# Patient Record
Sex: Female | Born: 1970
Health system: Southern US, Community
[De-identification: ages and names within clinical notes are randomized; demographics above are authoritative.]

## PROBLEM LIST (undated history)

## (undated) DIAGNOSIS — J45909 Unspecified asthma, uncomplicated: Secondary | ICD-10-CM

## (undated) DIAGNOSIS — Z923 Personal history of irradiation: Secondary | ICD-10-CM

## (undated) DIAGNOSIS — C801 Malignant (primary) neoplasm, unspecified: Secondary | ICD-10-CM

## (undated) DIAGNOSIS — A6000 Herpesviral infection of urogenital system, unspecified: Secondary | ICD-10-CM

## (undated) HISTORY — PX: MOUTH SURGERY: SHX715

## (undated) HISTORY — DX: Personal history of irradiation: Z92.3

## (undated) HISTORY — DX: Unspecified asthma, uncomplicated: J45.909

## (undated) HISTORY — PX: FOOT SURGERY: SHX648

## (undated) HISTORY — DX: Herpesviral infection of urogenital system, unspecified: A60.00

---

## 2000-12-11 ENCOUNTER — Other Ambulatory Visit: Admission: RE | Admit: 2000-12-11 | Discharge: 2000-12-11 | Payer: Self-pay

## 2001-12-12 ENCOUNTER — Other Ambulatory Visit: Admission: RE | Admit: 2001-12-12 | Discharge: 2001-12-12 | Payer: Self-pay

## 2004-03-07 ENCOUNTER — Other Ambulatory Visit: Admission: RE | Admit: 2004-03-07 | Discharge: 2004-03-07 | Payer: Self-pay | Admitting: Unknown Physician Specialty

## 2004-03-07 ENCOUNTER — Other Ambulatory Visit: Admission: RE | Admit: 2004-03-07 | Discharge: 2004-03-07 | Payer: Self-pay | Admitting: Family Medicine

## 2005-04-05 ENCOUNTER — Other Ambulatory Visit: Admission: RE | Admit: 2005-04-05 | Discharge: 2005-04-05 | Payer: Self-pay | Admitting: Family Medicine

## 2006-04-12 ENCOUNTER — Other Ambulatory Visit: Admission: RE | Admit: 2006-04-12 | Discharge: 2006-04-12 | Payer: Self-pay | Admitting: *Deleted

## 2006-09-19 ENCOUNTER — Encounter: Admission: RE | Admit: 2006-09-19 | Discharge: 2006-09-19 | Payer: Self-pay | Admitting: Family Medicine

## 2007-05-22 ENCOUNTER — Other Ambulatory Visit: Admission: RE | Admit: 2007-05-22 | Discharge: 2007-05-22 | Payer: Self-pay | Admitting: Family Medicine

## 2008-05-24 ENCOUNTER — Other Ambulatory Visit: Admission: RE | Admit: 2008-05-24 | Discharge: 2008-05-24 | Payer: Self-pay | Admitting: Family Medicine

## 2009-09-21 ENCOUNTER — Other Ambulatory Visit: Admission: RE | Admit: 2009-09-21 | Discharge: 2009-09-21 | Payer: Self-pay | Admitting: *Deleted

## 2010-09-29 ENCOUNTER — Other Ambulatory Visit: Admission: RE | Admit: 2010-09-29 | Discharge: 2010-09-29 | Payer: Self-pay | Admitting: *Deleted

## 2011-10-08 ENCOUNTER — Other Ambulatory Visit (HOSPITAL_COMMUNITY)
Admission: RE | Admit: 2011-10-08 | Discharge: 2011-10-08 | Disposition: A | Discharge status: Home / Self Care | Payer: 59 | Source: Ambulatory Visit | Attending: Family Medicine | Admitting: Family Medicine

## 2011-10-08 ENCOUNTER — Other Ambulatory Visit: Payer: Self-pay

## 2011-10-08 DIAGNOSIS — Z01419 Encounter for gynecological examination (general) (routine) without abnormal findings: Secondary | ICD-10-CM | POA: Insufficient documentation

## 2011-10-09 ENCOUNTER — Other Ambulatory Visit: Payer: Self-pay | Admitting: Family Medicine

## 2011-10-09 DIAGNOSIS — Z1231 Encounter for screening mammogram for malignant neoplasm of breast: Secondary | ICD-10-CM

## 2011-10-22 ENCOUNTER — Ambulatory Visit: Payer: Self-pay

## 2011-10-29 ENCOUNTER — Ambulatory Visit
Admission: RE | Admit: 2011-10-29 | Discharge: 2011-10-29 | Disposition: A | Discharge status: Home / Self Care | Payer: 59 | Source: Ambulatory Visit | Attending: Family Medicine | Admitting: Family Medicine

## 2011-10-29 DIAGNOSIS — Z1231 Encounter for screening mammogram for malignant neoplasm of breast: Secondary | ICD-10-CM

## 2012-10-27 ENCOUNTER — Other Ambulatory Visit (HOSPITAL_COMMUNITY)
Admission: RE | Admit: 2012-10-27 | Discharge: 2012-10-27 | Disposition: A | Discharge status: Home / Self Care | Payer: 59 | Source: Ambulatory Visit | Attending: Family Medicine | Admitting: Family Medicine

## 2012-10-27 ENCOUNTER — Other Ambulatory Visit: Payer: Self-pay | Admitting: Family Medicine

## 2012-10-27 DIAGNOSIS — Z0000 Encounter for general adult medical examination without abnormal findings: Secondary | ICD-10-CM | POA: Insufficient documentation

## 2012-11-17 ENCOUNTER — Other Ambulatory Visit: Payer: Self-pay | Admitting: Family Medicine

## 2012-11-17 DIAGNOSIS — Z1231 Encounter for screening mammogram for malignant neoplasm of breast: Secondary | ICD-10-CM

## 2012-12-16 ENCOUNTER — Ambulatory Visit
Admission: RE | Admit: 2012-12-16 | Discharge: 2012-12-16 | Disposition: A | Discharge status: Home / Self Care | Payer: 59 | Source: Ambulatory Visit | Attending: Family Medicine | Admitting: Family Medicine

## 2012-12-16 DIAGNOSIS — Z1231 Encounter for screening mammogram for malignant neoplasm of breast: Secondary | ICD-10-CM

## 2013-01-24 ENCOUNTER — Ambulatory Visit (INDEPENDENT_AMBULATORY_CARE_PROVIDER_SITE_OTHER): Payer: 59 | Admitting: Family Medicine

## 2013-01-24 VITALS — BP 114/74 | HR 71 | Temp 98.4°F | Resp 18 | Wt 150.0 lb

## 2013-01-24 DIAGNOSIS — H60391 Other infective otitis externa, right ear: Secondary | ICD-10-CM

## 2013-01-24 DIAGNOSIS — J039 Acute tonsillitis, unspecified: Secondary | ICD-10-CM

## 2013-01-24 DIAGNOSIS — H60399 Other infective otitis externa, unspecified ear: Secondary | ICD-10-CM

## 2013-01-24 MED ORDER — AMOXICILLIN-POT CLAVULANATE 875-125 MG PO TABS: 1.0000 | ORAL_TABLET | Freq: Two times a day (BID) | ORAL | Status: DC

## 2013-01-24 MED ORDER — ANTIPYRINE-BENZOCAINE 5.4-1.4 % OT SOLN: 3.0000 [drp] | OTIC | Status: DC | PRN

## 2013-01-24 MED ORDER — CARBAMIDE PEROXIDE 6.5 % OT SOLN: 5.0000 [drp] | OTIC | Status: DC | PRN

## 2013-01-24 NOTE — Addendum Note (Signed)
Addended byCaffie Damme on: 01/24/2013 05:14 PM   Modules accepted: Orders

## 2013-01-24 NOTE — Progress Notes (Signed)
Subjective:    Patient ID: [name] [name], female    DOB: [birth year], 42 y.o.   MRN: [id number]  HPI Right side started hurting on Thursday - has had before but never lasted this long - initially was intermittent but now constant.  Has been taking pain medicine, ibuprofen, but none this morning. No f/c. Right side of throat sore and adenopathy. Able to tol po but worse with chewing. No tinnitus, change in hearing, or drainage.  History reviewed. No pertinent past medical history. No current outpatient prescriptions on file prior to visit.   No current facility-administered medications on file prior to visit.   No Known Allergies   Review of Systems  Constitutional: Positive for appetite change. Negative for fever, chills, diaphoresis and fatigue.  HENT: Positive for ear pain, sore throat, trouble swallowing and neck pain. Negative for hearing loss, nosebleeds, congestion, rhinorrhea, sneezing, drooling, mouth sores, neck stiffness, dental problem, voice change, postnasal drip, sinus pressure, tinnitus and ear discharge.   Eyes: Negative for pain and itching.  Respiratory: Negative for cough and shortness of breath.   Cardiovascular: Negative for chest pain.  Gastrointestinal: Negative for nausea, vomiting, abdominal pain, diarrhea and constipation.  Genitourinary: Negative for dysuria.  Musculoskeletal: Negative for myalgias, joint swelling, arthralgias and gait problem.  Neurological: Negative for dizziness, syncope and headaches.  Hematological: Positive for adenopathy.  Psychiatric/Behavioral: Positive for sleep disturbance.      BP 114/74  Pulse 71  Temp(Src) 98.4 F (36.9 C) (Oral)  Resp 18  Wt 150 lb (68.04 kg)  LMP 01/17/2013 Objective:   Physical Exam  Constitutional: She is oriented to person, place, and time. She appears well-developed and well-nourished. No distress.  HENT:  Head: Normocephalic and atraumatic. No trismus in the jaw.  Right Ear: There is swelling  and tenderness. Tympanic membrane is injected, erythematous and bulging.  Left Ear: Tympanic membrane, external ear and ear canal normal.  Nose: Nose normal. No mucosal edema or rhinorrhea.  Mouth/Throat: Uvula is midline and mucous membranes are normal. Mucous membranes are not pale and not dry. No edematous. Posterior oropharyngeal edema and posterior oropharyngeal erythema present. No oropharyngeal exudate or tonsillar abscesses.  Pt tearful with otoscopic exam due to pain.  Right canal with erythema, edema, and pain.  No exudate. Tonsils 1+ bilaterally  Eyes: Conjunctivae are normal. Right eye exhibits no discharge. Left eye exhibits no discharge. No scleral icterus.  Neck: Normal range of motion. Neck supple.  Cardiovascular: Normal rate, regular rhythm, normal heart sounds and intact distal pulses.   Pulmonary/Chest: Effort normal and breath sounds normal. No respiratory distress.  Lymphadenopathy:       Head (right side): Submandibular, tonsillar and preauricular adenopathy present. No posterior auricular and no occipital adenopathy present.       Head (left side): No submandibular, no tonsillar, no preauricular, no posterior auricular and no occipital adenopathy present.    She has cervical adenopathy.       Right cervical: Superficial cervical adenopathy present. No posterior cervical adenopathy present.      Left cervical: No superficial cervical and no posterior cervical adenopathy present.       Right: No supraclavicular adenopathy present.       Left: No supraclavicular adenopathy present.  Neurological: She is alert and oriented to person, place, and time.  Skin: Skin is warm and dry. She is not diaphoretic. No erythema.  Psychiatric: She has a normal mood and affect. Her behavior is normal.  Assessment & Plan:  Otitis, externa, infective, right - Plan: amoxicillin-clavulanate (AUGMENTIN) 875-125 MG per tablet, antipyrine-benzocaine (AURALGAN) otic solution   Stop  using qtips to clean out ears - try debrox or if builds up, can be lavaged in ovvice. Tonsillitis  Meds ordered this encounter  Medications  . amoxicillin-clavulanate (AUGMENTIN) 875-125 MG per tablet    Sig: Take 1 tablet by mouth 2 (two) times daily.    Dispense:  20 tablet    Refill:  0  . antipyrine-benzocaine (AURALGAN) otic solution    Sig: Place 3 drops into the right ear every 2 (two) hours as needed for pain.    Dispense:  10 mL    Refill:  0  . carbamide peroxide (DEBROX) 6.5 % otic solution    Sig: Place 5 drops into both ears as needed.    Dispense:  15 mL    Refill:  5

## 2013-01-24 NOTE — Patient Instructions (Addendum)
Carbamide Peroxide ear solution What is this medicine? CARBAMIDE PEROXIDE (CAR bah mide per OX ide) is used to soften and help remove ear wax. This medicine may be used for other purposes; ask your health care provider or pharmacist if you have questions. What should I tell my health care provider before I take this medicine? They need to know if you have any of these conditions: -dizziness -ear discharge -ear pain, irritation or rash -infection -perforated eardrum (hole in eardrum) -an unusual or allergic reaction to carbamide peroxide, glycerin, hydrogen peroxide, other medicines, foods, dyes, or preservatives -pregnant or trying to get pregnant -breast-feeding How should I use this medicine? This medicine is only for use in the outer ear canal. Follow the directions carefully. Wash hands before and after use. The solution may be warmed by holding the bottle in the hand for 1 to 2 minutes. Lie with the affected ear facing upward. Place the proper number of drops into the ear canal. After the drops are instilled, remain lying with the affected ear upward for 5 minutes to help the drops stay in the ear canal. A cotton ball may be gently inserted at the ear opening for no longer than 5 to 10 minutes to ensure retention. Repeat, if necessary, for the opposite ear. Do not touch the tip of the dropper to the ear, fingertips, or other surface. Do not rinse the dropper after use. Keep container tightly closed. Talk to your pediatrician regarding the use of this medicine in children. While this drug may be used in children as young as 12 years for selected conditions, precautions do apply. Overdosage: If you think you have taken too much of this medicine contact a poison control center or emergency room at once. NOTE: This medicine is only for you. Do not share this medicine with others. What if I miss a dose? If you miss a dose, use it as soon as you can. If it is almost time for your next dose, use  only that dose. Do not use double or extra doses. What may interact with this medicine? Interactions are not expected. Do not use any other ear products without asking your doctor or health care professional. This list may not describe all possible interactions. Give your health care provider a list of all the medicines, herbs, non-prescription drugs, or dietary supplements you use. Also tell them if you smoke, drink alcohol, or use illegal drugs. Some items may interact with your medicine. What should I watch for while using this medicine? This medicine is not for long-term use. Do not use for more than 4 days without checking with your health care professional. Contact your doctor or health care professional if your condition does not start to get better within a few days or if you notice burning, redness, itching or swelling. What side effects may I notice from receiving this medicine? Side effects that you should report to your doctor or health care professional as soon as possible: -allergic reactions like skin rash, itching or hives, swelling of the face, lips, or tongue -burning, itching, and redness -worsening ear pain -rash Side effects that usually do not require medical attention (report to your doctor or health care professional if they continue or are bothersome): -abnormal sensation while putting the drops in the ear -temporary reduction in hearing (but not complete loss of hearing) This list may not describe all possible side effects. Call your doctor for medical advice about side effects. You may report side effects to FDA  at 1-800-FDA-1088. Where should I keep my medicine? Keep out of the reach of children. Store at room temperature between 15 and 30 degrees C (59 and 86 degrees F) in a tight, light-resistant container. Keep bottle away from excessive heat and direct sunlight. Throw away any unused medicine after the expiration date. NOTE: This sheet is a summary. It may not cover  all possible information. If you have questions about this medicine, talk to your doctor, pharmacist, or health care provider.  2013, [employer]vier/Gold Standard. (02/17/2008 2:00:02 PM) Otitis Externa Otitis externa is a bacterial or fungal infection of the outer ear canal. This is the area from the eardrum to the outside of the ear. Otitis externa is sometimes called "swimmer's ear." CAUSES  Possible causes of infection include:  Swimming in dirty water.  Moisture remaining in the ear after swimming or bathing.  Mild injury (trauma) to the ear.  Objects stuck in the ear (foreign body).  Cuts or scrapes (abrasions) on the outside of the ear. SYMPTOMS  The first symptom of infection is often itching in the ear canal. Later signs and symptoms may include swelling and redness of the ear canal, ear pain, and yellowish-white fluid (pus) coming from the ear. The ear pain may be worse when pulling on the earlobe. DIAGNOSIS  Your caregiver will perform a physical exam. A sample of fluid may be taken from the ear and examined for bacteria or fungi. TREATMENT  Antibiotic ear drops are often given for 10 to 14 days. Treatment may also include pain medicine or corticosteroids to reduce itching and swelling. PREVENTION   Keep your ear dry. Use the corner of a towel to absorb water out of the ear canal after swimming or bathing.  Avoid scratching or putting objects inside your ear. This can damage the ear canal or remove the protective wax that lines the canal. This makes it easier for bacteria and fungi to grow.  Avoid swimming in lakes, polluted water, or poorly chlorinated pools.  You may use ear drops made of rubbing alcohol and vinegar after swimming. Combine equal parts of white vinegar and alcohol in a bottle. Put 3 or 4 drops into each ear after swimming. HOME CARE INSTRUCTIONS   Apply antibiotic ear drops to the ear canal as prescribed by your caregiver.  Only take over-the-counter or  prescription medicines for pain, discomfort, or fever as directed by your caregiver.  If you have diabetes, follow any additional treatment instructions from your caregiver.  Keep all follow-up appointments as directed by your caregiver. SEEK MEDICAL CARE IF:   You have a fever.  Your ear is still red, swollen, painful, or draining pus after 3 days.  Your redness, swelling, or pain gets worse.  You have a severe headache.  You have redness, swelling, pain, or tenderness in the area behind your ear. MAKE SURE YOU:   Understand these instructions.  Will watch your condition.  Will get help right away if you are not doing well or get worse. Document Released: 11/05/2005 Document Revised: 01/28/2012 Document Reviewed: 11/22/2011 [facility] Patient Information 2013 [place], [place].

## 2013-01-24 NOTE — Addendum Note (Signed)
Addended by: Maryann Alar on: 01/24/2013 09:25 AM   Modules accepted: Orders, Medications

## 2013-01-26 ENCOUNTER — Telehealth: Payer: Self-pay

## 2013-01-26 NOTE — Telephone Encounter (Signed)
Called her, left message for her to call me back, what does she need this is first message I have gotten.

## 2013-01-26 NOTE — Telephone Encounter (Signed)
PT STATES SHE HAD CALLED EARLIER AND WAS WAITING FOR A CALL BACK AND HADN'T HEARD FROM ANYONE. PLEASE CALL [phone]

## 2013-01-27 NOTE — Telephone Encounter (Signed)
Called her again what is she in need of?lm forcall back

## 2013-01-28 NOTE — Telephone Encounter (Signed)
See below patient wants to know if there is anything else she can do. Not much better.

## 2013-01-28 NOTE — Telephone Encounter (Signed)
Patient states ears not better with ear drops.

## 2013-01-28 NOTE — Telephone Encounter (Signed)
Patient has questions.  

## 2013-01-28 NOTE — Telephone Encounter (Signed)
She has been taking the Augmentin since Sat and ears not much better with the ear drops.

## 2013-01-29 NOTE — Telephone Encounter (Signed)
Left message for her to call me back. 

## 2013-01-29 NOTE — Telephone Encounter (Signed)
She may just need a different antibiotic or antibiotic ear drops but we need to make sure she hasn't developed an abscess on her tonsil or different type of ear infection - like an otitis externa and make sure she doesn't need to see ENT so rec RTC for further eval.

## 2013-01-29 NOTE — Telephone Encounter (Signed)
Patient advised of need for office visit. She can come in later today.

## 2013-07-28 ENCOUNTER — Ambulatory Visit
Admission: RE | Admit: 2013-07-28 | Discharge: 2013-07-28 | Disposition: A | Discharge status: Home / Self Care | Payer: 59 | Source: Ambulatory Visit | Attending: Family Medicine | Admitting: Family Medicine

## 2013-07-28 ENCOUNTER — Other Ambulatory Visit: Payer: Self-pay | Admitting: Family Medicine

## 2013-07-28 DIAGNOSIS — N63 Unspecified lump in unspecified breast: Secondary | ICD-10-CM

## 2013-08-27 ENCOUNTER — Other Ambulatory Visit: Payer: Self-pay | Admitting: Obstetrics & Gynecology

## 2013-09-08 ENCOUNTER — Telehealth: Payer: Self-pay | Admitting: Oncology

## 2013-09-08 NOTE — Telephone Encounter (Signed)
[facility] 11/04 @ 2 W/DR. [facility] WELCOME PACKET MAILED.

## 2013-09-09 ENCOUNTER — Telehealth: Payer: Self-pay | Admitting: Oncology

## 2013-09-09 NOTE — Telephone Encounter (Signed)
C/D /  09/09/13 for appt 09/22/13

## 2013-09-22 ENCOUNTER — Encounter: Payer: 59 | Admitting: Oncology

## 2014-03-09 ENCOUNTER — Encounter: Payer: Self-pay | Admitting: Cardiology

## 2014-03-09 ENCOUNTER — Encounter: Payer: Self-pay | Admitting: *Deleted

## 2014-03-09 ENCOUNTER — Ambulatory Visit (INDEPENDENT_AMBULATORY_CARE_PROVIDER_SITE_OTHER): Payer: 59 | Admitting: Cardiology

## 2014-03-09 DIAGNOSIS — R001 Bradycardia, unspecified: Secondary | ICD-10-CM

## 2014-03-09 DIAGNOSIS — I498 Other specified cardiac arrhythmias: Secondary | ICD-10-CM

## 2014-03-09 DIAGNOSIS — A6000 Herpesviral infection of urogenital system, unspecified: Secondary | ICD-10-CM | POA: Insufficient documentation

## 2014-03-09 DIAGNOSIS — R011 Cardiac murmur, unspecified: Secondary | ICD-10-CM

## 2014-03-09 NOTE — Progress Notes (Signed)
     HPI: 43 yo female for evaluation of bradycardia. Patient exercises routinely and denies dyspnea on exertion, orthopnea, PND, pedal edema, palpitations, syncope or exertional chest pain. She has had significant stressors in her life recently. In the past 2 months she has had occasions where as she is falling asleep she feels her heart slowed down as if it will not continue. She has not had syncope or chest pain.  Current Outpatient Prescriptions  Medication Sig Dispense Refill  . Cholecalciferol (VITAMIN D3) 2000 UNITS TABS Take by mouth daily.      Marland Kitchen co-enzyme Q-10 30 MG capsule Take 30 mg by mouth daily.      . Omega-3 Fatty Acids (OMEGA-3 FISH OIL PO) Take by mouth daily.      . Prenatal Vit-Fe Fumarate-FA (PRENATAL MULTIVITAMIN) TABS tablet Take 1 tablet by mouth daily at 12 noon.      . zinc gluconate 50 MG tablet Take 50 mg by mouth daily.       No current facility-administered medications for this visit.    No Known Allergies   Past Medical History  Diagnosis Date  . Genital herpes     History reviewed. No pertinent past surgical history.  History   Social History  . Marital Status: Married    Spouse Name: N/A    Number of Children: N/A  . Years of Education: N/A   Occupational History  . Not on file.   Social History Main Topics  . Smoking status: Never Smoker   . Smokeless tobacco: Not on file  . Alcohol Use: Yes     Comment: social  . Drug Use: No  . Sexual Activity: Yes   Other Topics Concern  . Not on file   Social History Narrative  . No narrative on file    Family History  Problem Relation Age of Onset  . CVA    . Cancer - Ovarian Sister   . Cancer      ROS: no fevers or chills, productive cough, hemoptysis, dysphasia, odynophagia, melena, hematochezia, dysuria, hematuria, rash, seizure activity, orthopnea, PND, pedal edema, claudication. Remaining systems are negative.  Physical Exam:   There were no vitals taken for this  visit.  General:  Well developed/well nourished in NAD Skin warm/dry Patient not depressed No peripheral clubbing Back-normal HEENT-normal/normal eyelids Neck supple/normal carotid upstroke bilaterally; no bruits; no JVD; no thyromegaly chest - CTA/ normal expansion CV - RRR/normal S1 and S2; no rubs or gallops;  PMI nondisplaced, 1/6 systolic ejection murmur Abdomen -NT/ND, no HSM, no mass, + bowel sounds, no bruit 2+ femoral pulses, no bruits Ext-no edema, chords, 2+ DP Neuro-grossly nonfocal  ECG Sinus rhythm at a rate of 66. RV conduction delay.

## 2014-03-09 NOTE — Patient Instructions (Signed)
Your physician recommends that you schedule a follow-up appointment in: AS NEEDED PENDING TEST RESULTS  Your physician has requested that you have an echocardiogram. Echocardiography is a painless test that uses sound waves to create images of your heart. It provides your doctor with information about the size and shape of your heart and how well your heart's chambers and valves are working. This procedure takes approximately one hour. There are no restrictions for this procedure.    Your physician has recommended that you wear a 24 HOUR holter monitor. Holter monitors are medical devices that record the heart's electrical activity. Doctors most often use these monitors to diagnose arrhythmias. Arrhythmias are problems with the speed or rhythm of the heartbeat. The monitor is a small, portable device. You can wear one while you do your normal daily activities. This is usually used to diagnose what is causing palpitations/syncope (passing out).

## 2014-03-09 NOTE — Assessment & Plan Note (Signed)
Patient is having what she feels is episodes of bradycardia when falling asleep. She is extremely concerned about this. I will arrange a Holter monitor to further assess.

## 2014-03-09 NOTE — Assessment & Plan Note (Signed)
Probable ejection murmur. Check echocardiogram.

## 2014-03-15 ENCOUNTER — Ambulatory Visit (HOSPITAL_COMMUNITY): Payer: 59 | Attending: Cardiology | Admitting: Cardiology

## 2014-03-15 ENCOUNTER — Encounter: Payer: Self-pay | Admitting: *Deleted

## 2014-03-15 ENCOUNTER — Encounter (INDEPENDENT_AMBULATORY_CARE_PROVIDER_SITE_OTHER): Payer: 59

## 2014-03-15 DIAGNOSIS — I498 Other specified cardiac arrhythmias: Secondary | ICD-10-CM | POA: Insufficient documentation

## 2014-03-15 DIAGNOSIS — I495 Sick sinus syndrome: Secondary | ICD-10-CM

## 2014-03-15 DIAGNOSIS — R001 Bradycardia, unspecified: Secondary | ICD-10-CM

## 2014-03-15 DIAGNOSIS — R011 Cardiac murmur, unspecified: Secondary | ICD-10-CM

## 2014-03-15 NOTE — Progress Notes (Signed)
Patient ID: [name] [name], female   DOB: February 09, 1971, 43 y.o.   MRN: [id number] E-Cardio 24 hour holter monitor applied to patient.

## 2014-03-15 NOTE — Progress Notes (Signed)
Echo performed. 

## 2014-03-19 ENCOUNTER — Telehealth: Payer: Self-pay | Admitting: *Deleted

## 2014-03-19 NOTE — Telephone Encounter (Signed)
Monitor reviewed by dr Stanford Breed shows sinus with pac's and brief PAT  pt aware of results

## 2014-09-08 ENCOUNTER — Other Ambulatory Visit: Payer: Self-pay | Admitting: Obstetrics & Gynecology

## 2014-09-08 ENCOUNTER — Other Ambulatory Visit (HOSPITAL_COMMUNITY)
Admission: RE | Admit: 2014-09-08 | Discharge: 2014-09-08 | Disposition: A | Discharge status: Home / Self Care | Payer: 59 | Source: Ambulatory Visit | Attending: Obstetrics & Gynecology | Admitting: Obstetrics & Gynecology

## 2014-09-08 DIAGNOSIS — Z01419 Encounter for gynecological examination (general) (routine) without abnormal findings: Secondary | ICD-10-CM | POA: Diagnosis present

## 2014-09-08 DIAGNOSIS — Z1151 Encounter for screening for human papillomavirus (HPV): Secondary | ICD-10-CM | POA: Diagnosis present

## 2014-09-09 LAB — CYTOLOGY - PAP

## 2015-01-31 ENCOUNTER — Ambulatory Visit (INDEPENDENT_AMBULATORY_CARE_PROVIDER_SITE_OTHER): Payer: 59

## 2015-01-31 ENCOUNTER — Encounter: Payer: Self-pay | Admitting: Podiatry

## 2015-01-31 ENCOUNTER — Ambulatory Visit (INDEPENDENT_AMBULATORY_CARE_PROVIDER_SITE_OTHER): Payer: 59 | Admitting: Podiatry

## 2015-01-31 VITALS — BP 126/73 | HR 66 | Resp 16

## 2015-01-31 DIAGNOSIS — M79672 Pain in left foot: Secondary | ICD-10-CM | POA: Diagnosis not present

## 2015-01-31 DIAGNOSIS — M6740 Ganglion, unspecified site: Secondary | ICD-10-CM | POA: Diagnosis not present

## 2015-01-31 NOTE — Progress Notes (Signed)
   Subjective:    Patient ID: [name] [name], female    DOB: 09-Feb-1971, 44 y.o.   MRN: [id number]  HPI Pt presents with knot on lateral aspect of right foot, nonpainful, noticed 2 months prior   Review of Systems  All other systems reviewed and are negative.      Objective:   Physical Exam        Assessment & Plan:

## 2015-02-02 NOTE — Progress Notes (Signed)
Subjective:     Patient ID: [name] [name], female   DOB: [birth year], 44 y.o.   MRN: [id number]  HPI patient presents stating that the outside of the right foot there is a lump that's been there for several months. It can become tender with certain types of shoes but does not hurt all the time and she thinks it has grown some in size. She is concerned about what it is and the fact that has grown   Review of Systems  All other systems reviewed and are negative.      Objective:   Physical Exam  Constitutional: She is oriented to person, place, and time.  Cardiovascular: Intact distal pulses.   Musculoskeletal: Normal range of motion.  Neurological: She is oriented to person, place, and time.  Skin: Skin is warm.  Nursing note and vitals reviewed.  neurovascular status intact with muscle strength adequate and range of motion subtalar midtarsal joint within normal limits. Patient's noted to have a large lump on the lateral side of the right foot measuring about 1.5 x 1.5 cm that appears to be freely movable within subcutaneous tissue and does not appear to have extension. 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 Today I went ahead and I did a proximal nerve block of the area and I then aspirated the lesion getting out about 2 mL of gelatinous material and I then expressed the rest of it and applied compression dressing. Explained the patient that the material indicate very strongly that this is a ganglionic cyst but they can recur at times and we will reevaluate her as needed

## 2015-06-22 ENCOUNTER — Encounter: Payer: Self-pay | Admitting: Podiatry

## 2015-06-22 ENCOUNTER — Ambulatory Visit (INDEPENDENT_AMBULATORY_CARE_PROVIDER_SITE_OTHER): Payer: Commercial Managed Care - HMO | Admitting: Podiatry

## 2015-06-22 VITALS — BP 135/77 | HR 67 | Resp 15

## 2015-06-22 DIAGNOSIS — M6740 Ganglion, unspecified site: Secondary | ICD-10-CM | POA: Diagnosis not present

## 2015-06-23 NOTE — Progress Notes (Signed)
Subjective:     Patient ID: [name] [name], female   DOB: June 25, 1971, 44 y.o.   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 Applied sterile dressing instructed on compression and reappoint for consideration of surgical removal if it reoccurs

## 2015-10-28 ENCOUNTER — Other Ambulatory Visit: Payer: Self-pay | Admitting: Obstetrics & Gynecology

## 2017-01-31 DIAGNOSIS — Z803 Family history of malignant neoplasm of breast: Secondary | ICD-10-CM | POA: Diagnosis not present

## 2017-01-31 DIAGNOSIS — Z1231 Encounter for screening mammogram for malignant neoplasm of breast: Secondary | ICD-10-CM | POA: Diagnosis not present

## 2017-02-19 DIAGNOSIS — Z0000 Encounter for general adult medical examination without abnormal findings: Secondary | ICD-10-CM | POA: Diagnosis not present

## 2017-02-19 DIAGNOSIS — Z13220 Encounter for screening for lipoid disorders: Secondary | ICD-10-CM | POA: Diagnosis not present

## 2017-06-12 DIAGNOSIS — R2241 Localized swelling, mass and lump, right lower limb: Secondary | ICD-10-CM | POA: Diagnosis not present

## 2017-09-18 DIAGNOSIS — Z01419 Encounter for gynecological examination (general) (routine) without abnormal findings: Secondary | ICD-10-CM | POA: Diagnosis not present

## 2018-02-03 DIAGNOSIS — Z1231 Encounter for screening mammogram for malignant neoplasm of breast: Secondary | ICD-10-CM | POA: Diagnosis not present

## 2018-02-03 DIAGNOSIS — Z803 Family history of malignant neoplasm of breast: Secondary | ICD-10-CM | POA: Diagnosis not present

## 2018-04-29 DIAGNOSIS — E559 Vitamin D deficiency, unspecified: Secondary | ICD-10-CM | POA: Diagnosis not present

## 2018-04-29 DIAGNOSIS — Z0000 Encounter for general adult medical examination without abnormal findings: Secondary | ICD-10-CM | POA: Diagnosis not present

## 2018-08-08 DIAGNOSIS — R300 Dysuria: Secondary | ICD-10-CM | POA: Diagnosis not present

## 2018-09-22 DIAGNOSIS — R300 Dysuria: Secondary | ICD-10-CM | POA: Diagnosis not present

## 2018-11-25 DIAGNOSIS — Z01419 Encounter for gynecological examination (general) (routine) without abnormal findings: Secondary | ICD-10-CM | POA: Diagnosis not present

## 2020-04-20 ENCOUNTER — Other Ambulatory Visit: Payer: Self-pay | Admitting: Family Medicine

## 2020-04-20 DIAGNOSIS — E049 Nontoxic goiter, unspecified: Secondary | ICD-10-CM

## 2020-04-28 ENCOUNTER — Ambulatory Visit
Admission: RE | Admit: 2020-04-28 | Discharge: 2020-04-28 | Disposition: A | Discharge status: Home / Self Care | Payer: 59 | Source: Ambulatory Visit | Attending: Family Medicine | Admitting: Family Medicine

## 2020-04-28 DIAGNOSIS — E049 Nontoxic goiter, unspecified: Secondary | ICD-10-CM

## 2020-06-27 ENCOUNTER — Other Ambulatory Visit: Payer: Self-pay | Admitting: Radiology

## 2020-06-29 ENCOUNTER — Telehealth: Payer: Self-pay | Admitting: Oncology

## 2020-06-29 ENCOUNTER — Other Ambulatory Visit: Payer: Self-pay | Admitting: Obstetrics & Gynecology

## 2020-06-29 DIAGNOSIS — R928 Other abnormal and inconclusive findings on diagnostic imaging of breast: Secondary | ICD-10-CM

## 2020-06-29 NOTE — Telephone Encounter (Signed)
Spoke to patient to confirm PM clinic, [name] [name] will send packet to patient

## 2020-07-01 ENCOUNTER — Encounter: Payer: Self-pay | Admitting: Genetic Counselor

## 2020-07-01 ENCOUNTER — Encounter: Payer: Self-pay | Admitting: *Deleted

## 2020-07-01 DIAGNOSIS — C50811 Malignant neoplasm of overlapping sites of right female breast: Secondary | ICD-10-CM

## 2020-07-05 NOTE — Progress Notes (Signed)
[place]  [phone] [phone]     ID: [name] [name] DOB: 1971/03/24  MR#: [id number]  UVO#:[id number]  Patient Care Team: Aretta Nip, MD as PCP - General (Family Medicine) Mauro Kaufmann, RN as Oncology Nurse Navigator Rockwell Germany, RN as Oncology Nurse Navigator Jovita Kussmaul, MD as Consulting Physician (General Surgery) Corbyn Steedman, Virgie Dad, MD as Consulting Physician (Oncology) Gery Pray, MD as Consulting Physician (Radiation Oncology) Janyth Pupa, DO as Consulting Physician (Obstetrics and Gynecology) Chauncey Cruel, MD OTHER MD:  CHIEF COMPLAINT: Estrogen receptor positive breast cancer  CURRENT TREATMENT: Awaiting definitive surgery   HISTORY OF CURRENT ILLNESS: [name] [name] herself noted a change in her right breast when exercising.  She thinks she first noticed this sometime in April 2021.  It was like an indentation when she AB duct at the right upper extremity.  Screening mammogram from 04/15/2020 was negative, with breast density category C.  However the problem became more of a palpable nontender lump and after evaluation by her primary care physician she underwent right breast ultrasonography at [facility] on 06/23/2020 showing: 1.6 cm mass in right breast at 8 o'clock; 1.2 cm mass in right breast at 9 o'clock; the masses are 1.5 cm apart; single indeterminate mildly abnormal node in right axilla.  Accordingly on 06/27/2020 she proceeded to biopsy of the right breast areas in question. The pathology from this procedure ([id number]) showed: invasive and in situ mammary carcinoma, e-cadherin positive, grade 1-2. Both masses showed similar morphology. Prognostic indicators significant for: estrogen receptor, 100% positive with strong staining intensity and progesterone receptor, 90% positive with moderate-weak staining intensity. Proliferation marker Ki67 at 2%. HER2 equivocal by immunohistochemistry (2+), but negative by  fluorescent in situ hybridization with a signals ratio 1.47 and number per cell 2.5.  The patient's subsequent history is as detailed below.   INTERVAL HISTORY: [name] [name] was evaluated in the multidisciplinary breast cancer clinic on 07/06/2020 accompanied by her husband [name]. Her case was also presented at the multidisciplinary breast cancer conference on the same day. At that time a preliminary plan was proposed: MRI to assess the extent of disease, then hopefully breast conserving surgery with sentinel lymph node sampling, Oncotype, adjuvant radiation, antiestrogens, and genetics.   REVIEW OF SYSTEMS: On the provided questionnaire, [name] [name] reports chills, weight change, wearing glasses, hoarse voice, sleeping with two pillows, change in stool habits, some abdominal pain, dribbling urine, palpable breast lump, back pain, numbness, and depression ("crying").  What concerns her most is a sensation of numbness in the right arm--not just the right hand--which can occur at times, perhaps at rest, sometimes with activity.  The patient denies unusual headaches, visual changes, nausea, vomiting, stiff neck, dizziness, or gait imbalance. There has been no cough, phlegm production, or pleurisy, no chest pain or pressure, and no change in bladder habits. The patient denies fever, rash, or bleeding. A detailed review of systems was otherwise entirely negative.   PAST MEDICAL HISTORY: Past Medical History:  Diagnosis Date   Asthma    Genital herpes     PAST SURGICAL HISTORY: Past Surgical History:  Procedure Laterality Date   FOOT SURGERY Right    MOUTH SURGERY Right     FAMILY HISTORY: Family History  Problem Relation Age of Onset   CVA Other    Cancer - Ovarian Sister    Cancer Other    Her father is currently 74, and her mother is 26 as of 06/2020. [name] [name] has 3 brothers and  4 sisters. She reports ovarian cancer in her sister at age 3 (sister is doing well now) and breast cancer in a  maternal great-aunt (post-menopausal).   GYNECOLOGIC HISTORY:  Patient's last menstrual period was 05/23/2020 (exact date). Menarche: 49 years old Age at first live birth: 49 years old Ramona P 1 LMP 7/5 - 05/28/2020, periods are irregular Contraceptive: never used HRT n/a  Hysterectomy? no BSO? 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 Husband Iona Beard "[name]" is a Airline pilot. She lives at home with [name]. Daughter Sherlie Ban, age 84, is a Education administrator here in [place]. [name] [name] attends [employer].    ADVANCED DIRECTIVES: In the absence of any documentation to the contrary, the patient's spouse is their HCPOA.    HEALTH MAINTENANCE: Social History   Tobacco Use   Smoking status: Never Smoker  Substance Use Topics   Alcohol use: Yes    Comment: social   Drug use: No     Colonoscopy: n/a (age)  PAP: 01/2020  Bone density: n/a (age)   No Known Allergies  Current Outpatient Medications  Medication Sig Dispense Refill   BIOTIN PO Take by mouth. Once weekly     Cholecalciferol (VITAMIN D3) 2000 UNITS TABS Take by mouth daily.     co-enzyme Q-10 30 MG capsule Take 30 mg by mouth daily.     Omega-3 Fatty Acids (OMEGA-3 FISH OIL PO) Take by mouth daily.     Prenatal Vit-Fe Fumarate-FA (PRENATAL MULTIVITAMIN) TABS tablet Take 1 tablet by mouth daily at 12 noon.     zinc gluconate 50 MG tablet Take 50 mg by mouth daily. (Patient not taking: Reported on 07/06/2020)     No current facility-administered medications for this visit.    OBJECTIVE: African-American woman who appears younger than stated age  49:   07/06/20 1307  BP: 127/86  Pulse: 75  Resp: 18  Temp: 98.4 F (36.9 C)  SpO2: 100%     Body mass index is 27.05 kg/m.   Wt Readings from Last 3 Encounters:  07/06/20 147 lb 14.4 oz (67.1 kg)  01/24/13 150 lb (68 kg)      ECOG FS:1 - Symptomatic but completely ambulatory  Ocular:  Sclerae unicteric, pupils round and equal Ear-nose-throat: Wearing a mask Lymphatic: No cervical or supraclavicular adenopathy Lungs no rales or rhonchi Heart regular rate and rhythm Abd soft, nontender, positive bowel sounds MSK no focal spinal tenderness, no joint edema Neuro: non-focal, well-oriented, appropriate affect Breasts: The right breast is status post recent biopsy.  There is a significant ecchymosis.  The left breast is benign.  Both axillae are benign.   LAB RESULTS:  CMP     Component Value Date/Time   NA 139 07/06/2020 1238   K 3.7 07/06/2020 1238   CL 107 07/06/2020 1238   CO2 26 07/06/2020 1238   GLUCOSE 91 07/06/2020 1238   BUN 9 07/06/2020 1238   CREATININE 1.09 (H) 07/06/2020 1238   CALCIUM 9.7 07/06/2020 1238   PROT 7.1 07/06/2020 1238   ALBUMIN 3.6 07/06/2020 1238   AST 22 07/06/2020 1238   ALT 17 07/06/2020 1238   ALKPHOS 43 07/06/2020 1238   BILITOT 0.5 07/06/2020 1238   GFRNONAA 60 (L) 07/06/2020 1238   GFRAA >60 07/06/2020 1238    No results found for: TOTALPROTELP, ALBUMINELP, A1GS, A2GS, BETS, BETA2SER, GAMS, MSPIKE, SPEI  Lab Results  Component Value Date   WBC 6.4 07/06/2020  NEUTROABS 3.8 07/06/2020   HGB 13.3 07/06/2020   HCT 39.2 07/06/2020   MCV 92.2 07/06/2020   PLT 363 07/06/2020    No results found for: LABCA2  No components found for: VELFYB017  No results for input(s): INR in the last 168 hours.  No results found for: LABCA2  No results found for: PZW258  No results found for: NID782  No results found for: UMP536  No results found for: CA2729  No components found for: HGQUANT  No results found for: CEA1 / No results found for: CEA1   No results found for: AFPTUMOR  No results found for: CHROMOGRNA  No results found for: KPAFRELGTCHN, LAMBDASER, KAPLAMBRATIO (kappa/lambda light chains)  No results found for: HGBA, HGBA2QUANT, HGBFQUANT, HGBSQUAN (Hemoglobinopathy evaluation)   No results found for:  LDH  No results found for: IRON, TIBC, IRONPCTSAT (Iron and TIBC)  No results found for: FERRITIN  Urinalysis No results found for: COLORURINE, APPEARANCEUR, LABSPEC, PHURINE, GLUCOSEU, HGBUR, BILIRUBINUR, KETONESUR, PROTEINUR, UROBILINOGEN, NITRITE, LEUKOCYTESUR   STUDIES: No results found.   ELIGIBLE FOR AVAILABLE RESEARCH PROTOCOL: AET  ASSESSMENT: 49 y.o. [place] woman status post right breast biopsy x2 on 06/27/2020 for an mT1c N0, stage IA invasive ductal carcinoma, grade 1 or 2, estrogen and progesterone receptor positive, HER-2 not amplified, with an MIB-1 of 2%.  (a) and equivocal right axillary lymph node was biopsied 06/27/2020 and was benign  (1) genetics testing  (2) definitive surgery to follow  (3) Oncotype to be obtained from the definitive surgical sample: Chemotherapy not anticipated  (4) adjuvant radiation  (5) antiestrogens: Consider goserelin/anastrozole  PLAN: I met today with [name] [name] to review her new diagnosis. Specifically we discussed the biology of her breast cancer, its diagnosis, staging, treatment  options and prognosis. We first reviewed the fact that cancer is not one disease but more than 100 different diseases and that it is important to keep them separate-- otherwise when friends and relatives discuss their own cancer experiences with [name] [name] confusion can result. Similarly we explained that if breast cancer spreads to the bone or liver, the patient would not have bone cancer or liver cancer, but breast cancer in the bone and breast cancer in the liver: one cancer in three places-- not 3 different cancers which otherwise would have to be treated in 3 different ways.  We discussed the difference between local and systemic therapy. In terms of loco-regional treatment, lumpectomy plus radiation is equivalent to mastectomy as far as survival is concerned. For this reason, and because the cosmetic results are generally superior, we recommend breast  conserving surgery.   We then discussed the rationale for systemic therapy. There is some risk that this cancer may have already spread to other parts of her body. Patients frequently ask at this point about bone scans, CAT scans and PET scans to find out if they have occult breast cancer somewhere else. The problem is that in early stage disease we are much more likely to find false positives then true cancers and this would expose the patient to unnecessary procedures as well as unnecessary radiation. Scans cannot answer the question the patient really would like to know, which is whether she has microscopic disease elsewhere in her body. For those reasons we generally do not recommend them.  In [name] case however we do have a symptom that I cannot easily explain namely the intermittent numbness in the right arm.  She has no neck symptoms.  I am going to obtain a CT  of the chest just to clarify this, however she understands I am not anticipating stage IV disease.  Next we went over the options for systemic therapy which are anti-estrogens, anti-HER-2 immunotherapy, and chemotherapy. [name] [name] does not meet criteria for anti-HER-2 immunotherapy. She is a good candidate for anti-estrogens.  The question of chemotherapy is more complicated. Chemotherapy is most effective in rapidly growing, aggressive tumors. It is much less effective in slow growing cancers, like [name] [name] 's. For that reason we are going to request an Oncotype from the definitive surgical sample, as suggested by NCCN guidelines. That will help [place] make a definitive decision regarding chemotherapy in this case.  [name] [name] also meets criteria for genetics testing.In patients who carry a deleterious mutation [for example in a  BRCA gene], the risk of a new breast cancer developing in the future may be sufficiently great that the patient may choose bilateral mastectomies. However if she wishes to keep her breasts in that situation it is safe  to do so. That would require intensified screening, which generally means not only yearly mammography but a yearly breast MRI as well.   Ensure the plan is for genetics testing, definitive surgery, Oncotype testing, radiation and antiestrogens.[name] [name] has a good understanding of the overall plan. She agrees with it. She knows the goal of treatment in her case is cure. She will call with any problems that may develop before her next visit here.  Total encounter time 70 minutes.Sarajane Jews C. Kourtnee Lahey, MD 07/06/2020 6:18 PM Medical Oncology and Hematology [facility] [place], [place] [address] Tel. [phone]    Fax. [phone]   This document serves as a record of services personally performed by Lurline Del, MD. It was created on his behalf by Wilburn Mylar, a trained medical scribe. The creation of this record is based on the scribe's personal observations and the provider's statements to them.   I, Lurline Del MD, have reviewed the above documentation for accuracy and completeness, and I agree with the above.    *Total Encounter Time as defined by the Centers for Medicare and Medicaid Services includes, in addition to the face-to-face time of a patient visit (documented in the note above) non-face-to-face time: obtaining and reviewing outside history, ordering and reviewing medications, tests or procedures, care coordination (communications with other health care professionals or caregivers) and documentation in the medical record.

## 2020-07-06 ENCOUNTER — Other Ambulatory Visit: Payer: Self-pay | Admitting: *Deleted

## 2020-07-06 ENCOUNTER — Inpatient Hospital Stay: Payer: 59 | Attending: Oncology | Admitting: Oncology

## 2020-07-06 ENCOUNTER — Ambulatory Visit: Payer: Self-pay | Admitting: General Surgery

## 2020-07-06 ENCOUNTER — Encounter: Payer: Self-pay | Admitting: *Deleted

## 2020-07-06 ENCOUNTER — Encounter: Payer: 59 | Admitting: Genetic Counselor

## 2020-07-06 ENCOUNTER — Ambulatory Visit
Admission: RE | Admit: 2020-07-06 | Discharge: 2020-07-06 | Disposition: A | Discharge status: Home / Self Care | Payer: 59 | Source: Ambulatory Visit | Attending: Radiation Oncology | Admitting: Radiation Oncology

## 2020-07-06 ENCOUNTER — Encounter: Payer: Self-pay | Admitting: General Practice

## 2020-07-06 ENCOUNTER — Inpatient Hospital Stay: Payer: 59

## 2020-07-06 ENCOUNTER — Encounter: Payer: Self-pay | Admitting: Physical Therapy

## 2020-07-06 ENCOUNTER — Other Ambulatory Visit: Payer: Self-pay

## 2020-07-06 ENCOUNTER — Ambulatory Visit: Payer: 59 | Attending: General Surgery | Admitting: Physical Therapy

## 2020-07-06 VITALS — BP 127/86 | HR 75 | Temp 98.4°F | Resp 18 | Ht 62.0 in | Wt 147.9 lb

## 2020-07-06 DIAGNOSIS — M79601 Pain in right arm: Secondary | ICD-10-CM | POA: Diagnosis present

## 2020-07-06 DIAGNOSIS — Z170 Estrogen receptor positive status [ER+]: Secondary | ICD-10-CM | POA: Insufficient documentation

## 2020-07-06 DIAGNOSIS — C50811 Malignant neoplasm of overlapping sites of right female breast: Secondary | ICD-10-CM

## 2020-07-06 DIAGNOSIS — C50511 Malignant neoplasm of lower-outer quadrant of right female breast: Secondary | ICD-10-CM | POA: Diagnosis not present

## 2020-07-06 LAB — CBC WITH DIFFERENTIAL (CANCER CENTER ONLY)
Abs Immature Granulocytes: 0.01 10*3/uL (ref 0.00–0.07)
Basophils Absolute: 0 10*3/uL (ref 0.0–0.1)
Basophils Relative: 1 %
Eosinophils Absolute: 0 10*3/uL (ref 0.0–0.5)
Eosinophils Relative: 0 %
HCT: 39.2 % (ref 36.0–46.0)
Hemoglobin: 13.3 g/dL (ref 12.0–15.0)
Immature Granulocytes: 0 %
Lymphocytes Relative: 33 %
Lymphs Abs: 2.1 10*3/uL (ref 0.7–4.0)
MCH: 31.3 pg (ref 26.0–34.0)
MCHC: 33.9 g/dL (ref 30.0–36.0)
MCV: 92.2 fL (ref 80.0–100.0)
Monocytes Absolute: 0.4 10*3/uL (ref 0.1–1.0)
Monocytes Relative: 7 %
Neutro Abs: 3.8 10*3/uL (ref 1.7–7.7)
Neutrophils Relative %: 59 %
Platelet Count: 363 10*3/uL (ref 150–400)
RBC: 4.25 MIL/uL (ref 3.87–5.11)
RDW: 11.9 % (ref 11.5–15.5)
WBC Count: 6.4 10*3/uL (ref 4.0–10.5)
nRBC: 0 % (ref 0.0–0.2)

## 2020-07-06 LAB — CMP (CANCER CENTER ONLY)
ALT: 17 U/L (ref 0–44)
AST: 22 U/L (ref 15–41)
Albumin: 3.6 g/dL (ref 3.5–5.0)
Alkaline Phosphatase: 43 U/L (ref 38–126)
Anion gap: 6 (ref 5–15)
BUN: 9 mg/dL (ref 6–20)
CO2: 26 mmol/L (ref 22–32)
Calcium: 9.7 mg/dL (ref 8.9–10.3)
Chloride: 107 mmol/L (ref 98–111)
Creatinine: 1.09 mg/dL — ABNORMAL HIGH (ref 0.44–1.00)
GFR, Est AFR Am: >60 mL/min (ref >60)
GFR, Estimated: 60 mL/min — ABNORMAL LOW (ref >60)
Glucose, Bld: 91 mg/dL (ref 70–99)
Potassium: 3.7 mmol/L (ref 3.5–5.1)
Sodium: 139 mmol/L (ref 135–145)
Total Bilirubin: 0.5 mg/dL (ref 0.3–1.2)
Total Protein: 7.1 g/dL (ref 6.5–8.1)

## 2020-07-06 LAB — GENETIC SCREENING ORDER

## 2020-07-06 NOTE — Patient Instructions (Signed)

## 2020-07-06 NOTE — Progress Notes (Signed)
[place] Psychosocial Distress Screening Spiritual Care  Met with [name] [name] and her husband in [facility] to introduce [place] team/resources, reviewing SDOH screening per protocol. Also assessed for distress and other psychosocial needs.   [name] [name] is just beginning to process her feelings about her diagnosis and welcomed Spiritual Care as part of her Support Team. She plans to review the [place] team/programming information in detail in the coming days. Provided empathic listening, normalization of feelings, pastoral reflection, and encouragement to practice self-care.  Follow up needed: Yes.  Plan to follow up by phone next week, as well as to place referrals per [name] request for an Pharmacist, hospital and for a call from [name], our incoming [name].   [place], [place], [facility] Pager [phone] Voicemail [phone]

## 2020-07-06 NOTE — Progress Notes (Signed)
Radiation Oncology         [phone] ________________________________  Multidisciplinary Breast Oncology Clinic [facility]) Initial Outpatient Consultation  Name: [name] [name] MRN: [id number]  Date: 07/06/2020  DOB: 08/16/1971  LK:RHNVKEW, Fanny Dance, MD  Griselda Miner, MD   REFERRING PHYSICIAN: Chevis Pretty III, MD  DIAGNOSIS: The encounter diagnosis was Malignant neoplasm of overlapping sites of right breast in female, estrogen receptor positive (HCC).  Stage T1c, N0, Mx Right Breast LOQ, Invasive Ductal Carcinoma with DCIS, ER+ / PR+ / Her2-, Grade 1-2    ICD-10-CM   1. Malignant neoplasm of overlapping sites of right breast in female, estrogen receptor positive (HCC)  C50.811    Z17.0     HISTORY OF PRESENT ILLNESS::[name] [name] is a 49 y.o. female who is presenting to the office today for evaluation of her newly diagnosed breast cancer. She is accompanied by her husband. She is doing well overall.   She had routine screening mammography on 04/15/2020 that did not show any mammographic evidence of malignancy. However, she then noticed a right breast indentation and palpable lump in July of 2021. Thus, she underwent a right breast ultrasound on 06/23/2020 at [facility] that showed a 1.6 x 1.0 x 1.6 cm irregular mass in the right breast at the 8 o'clock position, middle depth, that was highly suggestive of malignancy. There was an additional mass at the 9 o'clock position, anterior middle depth, that measured 1.1 x 1.2 x 0.8 cm and was also highly suggestive of malignancy. Finally, there was a single mildly abnormal node in the right axilla that was indeterminate.  Biopsy on 06/27/2020 showed grade 1-2 invasive mammary carcinoma with mammary carcinoma in-situ of both right breast masses. E-cadherin was positive, supporting a ductal origin. Prognostic indicators were significant for estrogen receptor, 100% positive with a strong staining intensity and progesterone receptor, 90% positive  with a moderate to weak staining intensity. Proliferation marker Ki67 at 2%. HER2 negative. The right axillary lymph node was negative for carcinoma.   Menarche: 49 years old Age at first live birth: 49 years old GP: 1 LMP: July 5th-10th, 2021 Contraceptive: None HRT: None   The patient was referred today for presentation in the multidisciplinary conference.  Radiology studies and pathology slides were presented there for review and discussion of treatment options.  A consensus was discussed regarding potential next steps.  PREVIOUS RADIATION THERAPY: No  PAST MEDICAL HISTORY:  Past Medical History:  Diagnosis Date  . Asthma   . Genital herpes     PAST SURGICAL HISTORY: Past Surgical History:  Procedure Laterality Date  . FOOT SURGERY Right   . MOUTH SURGERY Right     FAMILY HISTORY:  Family History  Problem Relation Age of Onset  . CVA Other   . Cancer - Ovarian Sister   . Cancer Other     SOCIAL HISTORY:  Social History   Socioeconomic History  . Marital status: Married    Spouse name: Not on file  . Number of children: Not on file  . Years of education: Not on file  . Highest education level: Not on file  Occupational History  . Not on file  Tobacco Use  . Smoking status: Never Smoker  Substance and Sexual Activity  . Alcohol use: Yes    Comment: social  . Drug use: No  . Sexual activity: Yes  Other Topics Concern  . Not on file  Social History Narrative  . Not on file   Social Determinants of  Health   Financial Resource Strain: Low Risk   . Difficulty of Paying Living Expenses: Not hard at all  Food Insecurity: No Food Insecurity  . Worried About Charity fundraiser in the Last Year: Never true  . Ran Out of Food in the Last Year: Never true  Transportation Needs: No Transportation Needs  . Lack of Transportation (Medical): No  . Lack of Transportation (Non-Medical): No  Physical Activity:   . Days of Exercise per Week:   . Minutes of Exercise  per Session:   Stress:   . Feeling of Stress :   Social Connections:   . Frequency of Communication with Friends and Family:   . Frequency of Social Gatherings with Friends and Family:   . Attends Religious Services:   . Active Member of Clubs or Organizations:   . Attends Archivist Meetings:   Marland Kitchen Marital Status:     ALLERGIES: No Known Allergies  MEDICATIONS:  Current Outpatient Medications  Medication Sig Dispense Refill  . BIOTIN PO Take by mouth. Once weekly    . Cholecalciferol (VITAMIN D3) 2000 UNITS TABS Take by mouth daily.    Marland Kitchen co-enzyme Q-10 30 MG capsule Take 30 mg by mouth daily.    . Omega-3 Fatty Acids (OMEGA-3 FISH OIL PO) Take by mouth daily.    . Prenatal Vit-Fe Fumarate-FA (PRENATAL MULTIVITAMIN) TABS tablet Take 1 tablet by mouth daily at 12 noon.    . zinc gluconate 50 MG tablet Take 50 mg by mouth daily. (Patient not taking: Reported on 07/06/2020)     No current facility-administered medications for this encounter.    REVIEW OF SYSTEMS: A 10+ POINT REVIEW OF SYSTEMS WAS OBTAINED including neurology, dermatology, psychiatry, cardiac, respiratory, lymph, extremities, GI, GU, musculoskeletal, constitutional, reproductive, HEENT. On the provided form, she reports chills, weight change, wearing glasses, hoarse voice, sleeping with two pillows, change in stool habits, occasional abdominal pain, dribbling urine, lump in right breast, back pain, numbness, and depression. She denies chest pain, shortness of breath, cough, skin changes, and any other symptoms.    PHYSICAL EXAM:   Vitals with BMI 07/06/2020  Height _0   Weight 147 lbs 14 oz  BMI 24.09  Systolic 735  Diastolic 86  Pulse 75   Lungs are clear to auscultation bilaterally. Heart has regular rate and rhythm. No palpable cervical, supraclavicular, or axillary adenopathy. Abdomen soft, non-tender, normal bowel sounds. Left breast with no palpable mass, nipple discharge, or bleeding.  Right breast  with bruising in the axillary region from lymph node biopsy. When raising her right arm, there is an indention in the lower outer quadrant of the breast. Additionally, there is a palpable lump in the lower outer quadrant that is estimated to be approximately 1.5 - 2.0 cm.  KPS = 90  100 - Normal; no complaints; no evidence of disease. 90   - Able to carry on normal activity; minor signs or symptoms of disease. 80   - Normal activity with effort; some signs or symptoms of disease. 42   - Cares for self; unable to carry on normal activity or to do active work. 60   - Requires occasional assistance, but is able to care for most of his personal needs. 50   - Requires considerable assistance and frequent medical care. 30   - Disabled; requires special care and assistance. 48   - Severely disabled; hospital admission is indicated although death not imminent. 62   - Very sick; hospital  admission necessary; active supportive treatment necessary. 10   - Moribund; fatal processes progressing rapidly. 0     - Dead  Karnofsky DA, Abelmann [place], Craver LS and Burchenal [facility] [phone]) The use of the nitrogen mustards in the palliative treatment of carcinoma: with particular reference to bronchogenic carcinoma Cancer 1 634-56  LABORATORY DATA:  Lab Results  Component Value Date   WBC 6.4 07/06/2020   HGB 13.3 07/06/2020   HCT 39.2 07/06/2020   MCV 92.2 07/06/2020   PLT 363 07/06/2020   Lab Results  Component Value Date   NA 139 07/06/2020   K 3.7 07/06/2020   CL 107 07/06/2020   CO2 26 07/06/2020   Lab Results  Component Value Date   ALT 17 07/06/2020   AST 22 07/06/2020   ALKPHOS 43 07/06/2020   BILITOT 0.5 07/06/2020    PULMONARY FUNCTION TEST:   Recent Review Flowsheet Data   There is no flowsheet data to display.     RADIOGRAPHY: No results found.    IMPRESSION: Stage T1c, N0, Mx Right Breast LOQ, Invasive Ductal Carcinoma with DCIS, ER+ / PR+ / Her2-, Grade 1-2  The patient will be  a good candidate for breast conservation with radiotherapy to the right breast. We discussed the general course of radiation, potential side effects, and toxicities with radiation and the patient is interested in this approach.  The patient has a strong family history of ovarian caner and so she may wait until the genetic testing has resulted before proceeding with breast conserving surgery.   PLAN:   1. Genetic testing 2. MRI 3. Chest CT scan 4. Right lumpectomy with sentinel lymph node biopsy 5. Oncotype Dx 6. Adjuvant radiation therapy 7. Anti-estrogen therapy   ------------------------------------------------  Blair Promise, PhD, MD  This document serves as a record of services personally performed by Gery Pray, MD. It was created on his behalf by Clerance Lav, a trained medical scribe. The creation of this record is based on the scribe's personal observations and the provider's statements to them. This document has been checked and approved by the attending provider.

## 2020-07-06 NOTE — Therapy (Signed)
[place], [place], [address] Phone: [phone]   Fax:  [phone]  Physical Therapy Evaluation  Patient Details  Name: [name] [name] MRN: [id number] Date of Birth: 1971/01/18 Referring Provider (PT): Dr. Autumn Messing   Encounter Date: 07/06/2020   PT End of Session - 07/06/20 2206    Visit Number 1    Number of Visits 2    Date for PT Re-Evaluation 08/31/20    PT Start Time 8563    PT Stop Time 1497   Also saw pt from 1451-1529 for a total of 40 min   PT Time Calculation (min) 8 min    Activity Tolerance Patient tolerated treatment well    Behavior During Therapy [facility] for tasks assessed/performed           Past Medical History:  Diagnosis Date  . Asthma   . Genital herpes     Past Surgical History:  Procedure Laterality Date  . FOOT SURGERY Right   . MOUTH SURGERY Right     There were no vitals filed for this visit.    Subjective Assessment - 07/06/20 2128    Subjective Patient reports she is here today to be seen by her medical team for her newly diagnosed right breast cancer.    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 There are 2 masses in the right lower outer quadrant measuring 1.6 cm and 1.2 cm. It is ER/PR positive and HER2 negative with a Ki67 of 2%.    Patient Stated Goals Reduce lymphedema risk and learn post op shoulder ROM HEP    Currently in Pain? Yes    Pain Score --   Varies; intermittent; mild   Pain Location Arm    Pain Orientation Right    Pain Descriptors / Indicators Tingling;Numbness    Pain Type Neuropathic pain    Pain Onset 1 to 4 weeks ago   2 weeks ago   Pain Frequency Intermittent    Aggravating Factors  Unknown    Pain Relieving Factors Unknown              [facility] PT Assessment - 07/06/20 0001      Assessment   Medical Diagnosis  Right breast cancer    Referring Provider (PT) Dr. Autumn Messing    Onset Date/Surgical Date 04/15/20    Hand Dominance Right    Prior Therapy none      Precautions   Precautions Other (comment)    Precaution Comments active cancer      Restrictions   Weight Bearing Restrictions No      Balance Screen   Has the patient fallen in the past 6 months No    Has the patient had a decrease in activity level because of a fear of falling?  No    Is the patient reluctant to leave their home because of a fear of falling?  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, structural, weights, and runs  Cognition   Overall Cognitive Status Within Functional Limits for tasks assessed      Posture/Postural Control   Posture/Postural Control No significant limitations      ROM / Strength   AROM / PROM / Strength AROM;Strength      AROM   Overall AROM Comments Cervical AROM is WNL    AROM Assessment Site Shoulder    Right/Left Shoulder Right;Left    Right Shoulder Extension 60 Degrees    Right Shoulder Flexion 144 Degrees    Right Shoulder ABduction 169 Degrees    Right Shoulder Internal Rotation 62 Degrees    Right Shoulder External Rotation 86 Degrees    Left Shoulder Extension 50 Degrees    Left Shoulder Flexion 152 Degrees    Left Shoulder ABduction 165 Degrees    Left Shoulder Internal Rotation 67 Degrees    Left Shoulder External Rotation 83 Degrees      Strength   Overall Strength Within functional limits for tasks performed             LYMPHEDEMA/ONCOLOGY QUESTIONNAIRE - 07/06/20 0001      Type   Cancer Type Right breast cancer      Lymphedema Assessments   Lymphedema  Assessments Upper extremities      Right Upper Extremity Lymphedema   10 cm Proximal to Olecranon Process 28 cm    Olecranon Process 23.5 cm    10 cm Proximal to Ulnar Styloid Process 22 cm    Just Proximal to Ulnar Styloid Process 14.4 cm    Across Hand at [employer] 19 cm    At [place] of 2nd Digit 5.5 cm      Left Upper Extremity Lymphedema   10 cm Proximal to Olecranon Process 26.8 cm    Olecranon Process 23.3 cm    10 cm Proximal to Ulnar Styloid Process 20.2 cm    Just Proximal to Ulnar Styloid Process 14.1 cm    Across Hand at [employer] 18.7 cm    At [place] of 2nd Digit 5.3 cm           L-DEX FLOWSHEETS - 07/06/20 2100      L-DEX LYMPHEDEMA SCREENING   Measurement Type Unilateral    L-DEX MEASUREMENT EXTREMITY Upper Extremity    POSITION  Standing    DOMINANT SIDE Right    At Risk Side Right    BASELINE SCORE (UNILATERAL) -0.2           The patient was assessed using the L-Dex machine today to produce a lymphedema index baseline score. The patient will be reassessed on a regular basis (typically every 3 months) to obtain new L-Dex scores. If the score is > 6.5 points away from his/her baseline score indicating onset of subclinical lymphedema, it will be recommended to wear a compression garment for 4 weeks, 12 hours per day and then be reassessed. If the score continues to be > 6.5 points from baseline at reassessment, we will initiate lymphedema treatment. Assessing in this manner has a 95% rate of preventing clinically significant lymphedema.      Katina Dung - 07/06/20 0001    Open a tight or new jar No difficulty    Do heavy household chores (wash walls, wash floors) No difficulty    Carry a shopping bag or briefcase No difficulty    Wash your back No difficulty    Use a knife to cut food No difficulty    Recreational activities in which you  take some force or impact through your arm, shoulder, or hand (golf, hammering, tennis) No difficulty    During  the past week, to what extent has your arm, shoulder or hand problem interfered with your normal social activities with family, friends, neighbors, or groups? Not at all    During the past week, to what extent has your arm, shoulder or hand problem limited your work or other regular daily activities Not at all    Arm, shoulder, or hand pain. None    Tingling (pins and needles) in your arm, shoulder, or hand Mild    Difficulty Sleeping No difficulty    DASH Score 2.27 %            Objective measurements completed on examination: See above findings.         Patient was instructed today in a home exercise program today for post op shoulder range of motion. These included active assist shoulder flexion in sitting, scapular retraction, wall walking with shoulder abduction, and hands behind head external rotation.  She was encouraged to do these twice a day, holding 3 seconds and repeating 5 times when permitted by her physician.          PT Education - 07/06/20 2205    Education Details Lymphedema risk reduction and post op shoulder ROM HEP    Person(s) Educated Patient;Spouse    Methods Explanation;Demonstration;Handout    Comprehension Returned demonstration;Verbalized understanding               PT Long Term Goals - 07/06/20 2215      PT LONG TERM GOAL #1   Title Patient will demonstrate she has regained full shoulder ROM and function post operatively compared to baselines.    Time 8    Period Weeks    Status New    Target Date 08/31/20           Breast Clinic Goals - 07/06/20 2215      Patient will be able to verbalize understanding of pertinent lymphedema risk reduction practices relevant to her diagnosis specifically related to skin care.   Time 1    Period Days    Status Achieved      Patient will be able to return demonstrate and/or verbalize understanding of the post-op home exercise program related to regaining shoulder range of motion.   Time 1     Period Days    Status Achieved      Patient will be able to verbalize understanding of the importance of attending the postoperative After Breast Cancer Class for further lymphedema risk reduction education and therapeutic exercise.   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 There are 2 masses in the right lower outer quadrant measuring 1.6 cm and 1.2 cm. It is ER/PR positive and HER2 negative with a Ki67 of 2%. Her multidisciplinary medical team met prior to her assessments to determine a recommended treatment plan. She is planning to have a right lumpectomy and sentinel node biopsy followed by Oncotype testing, radiation, and anti-estrogen therapy. She will benefit from a post op PT reasessment to determine needs and from L-Dex screenings every 3 months for 2 years.    Stability/Clinical Decision Making Stable/Uncomplicated    Clinical  Decision Making Low    Rehab Potential Excellent    PT Frequency --   Eval and 1 f/u visit   PT Treatment/Interventions ADLs/Self Care Home Management;Therapeutic exercise;Patient/family education    PT Next Visit Plan Will reassess 3-4 weeks post op to determine needs    PT Home Exercise Plan Post op shoulder ROM HEP    Consulted and Agree with Plan of Care Patient           Patient will benefit from skilled therapeutic intervention in order to improve the following deficits and impairments:  Postural dysfunction, Decreased knowledge of precautions, Impaired UE functional use, Pain, Decreased range of motion  Visit Diagnosis: Malignant neoplasm of lower-outer quadrant of right breast of female, estrogen receptor positive ([place]) - Plan: PT plan of care cert/re-cert  Pain in right arm - Plan: PT plan of care cert/re-cert   Patient will follow up at outpatient cancer rehab 3-4 weeks  following surgery.  If the patient requires physical therapy at that time, a specific plan will be dictated and sent to the referring physician for approval. The patient was educated today on appropriate basic range of motion exercises to begin post operatively and the importance of attending the After Breast Cancer class following surgery.  Patient was educated today on lymphedema risk reduction practices as it pertains to recommendations that will benefit the patient immediately following surgery.  She verbalized good understanding.      Problem List Patient Active Problem List   Diagnosis Date Noted  . Malignant neoplasm of overlapping sites of right breast in female, estrogen receptor positive ([place]) 07/01/2020  . Murmur 03/09/2014  . Bradycardia 03/09/2014  . Genital herpes    Annia Friendly, [place] 07/06/20 10:22 PM  [place], [place], [address] Phone: [phone]   Fax:  [phone]  Name: [name] [name] MRN: [id number] Date of Birth: Sep 16, 1971

## 2020-07-07 ENCOUNTER — Inpatient Hospital Stay (HOSPITAL_BASED_OUTPATIENT_CLINIC_OR_DEPARTMENT_OTHER): Payer: 59 | Admitting: Genetic Counselor

## 2020-07-07 DIAGNOSIS — C50811 Malignant neoplasm of overlapping sites of right female breast: Secondary | ICD-10-CM | POA: Diagnosis not present

## 2020-07-07 DIAGNOSIS — Z803 Family history of malignant neoplasm of breast: Secondary | ICD-10-CM

## 2020-07-07 DIAGNOSIS — Z8041 Family history of malignant neoplasm of ovary: Secondary | ICD-10-CM | POA: Diagnosis not present

## 2020-07-07 DIAGNOSIS — Z170 Estrogen receptor positive status [ER+]: Secondary | ICD-10-CM | POA: Diagnosis not present

## 2020-07-08 ENCOUNTER — Encounter: Payer: Self-pay | Admitting: Genetic Counselor

## 2020-07-08 ENCOUNTER — Telehealth: Payer: Self-pay | Admitting: Oncology

## 2020-07-08 DIAGNOSIS — Z8041 Family history of malignant neoplasm of ovary: Secondary | ICD-10-CM

## 2020-07-08 DIAGNOSIS — Z803 Family history of malignant neoplasm of breast: Secondary | ICD-10-CM | POA: Insufficient documentation

## 2020-07-08 HISTORY — DX: Family history of malignant neoplasm of ovary: Z80.41

## 2020-07-08 HISTORY — DX: Family history of malignant neoplasm of breast: Z80.3

## 2020-07-08 NOTE — Telephone Encounter (Signed)
Scheduled appts per 8/18 los. Left voicemail with appt date and time.

## 2020-07-08 NOTE — Progress Notes (Signed)
REFERRING PROVIDER: Chauncey Cruel, MD [address] [place],  [place] [address]  PRIMARY PROVIDER:  Aretta Nip, MD  PRIMARY REASON FOR VISIT:  1. Malignant neoplasm of overlapping sites of right breast in female, estrogen receptor positive ([place])   2. Family history of ovarian cancer   3. Family history of breast cancer     HISTORY OF PRESENT ILLNESS:   [name] [name], a 49 y.o. female, was seen for a [place] cancer genetics consultation at the request of Dr. Jana Hakim due to a personal history of breast cancer and a family history of ovarian and breast cancer. This visit took place over a secure televideo platform.  [name] [name] identity was confirmed with two personal identifiers.  [name] [name] presents to clinic today to discuss the possibility of a hereditary predisposition to cancer, to discuss genetic testing, and to further clarify her future cancer risks, as well as potential cancer risks for family members.   In 2021, at the age of 83, [name] [name] was diagnosed with invasive ductal carcinoma of the right breast (ER+/PR+/HER2-). The preliminary treatment plan includes surgery, adjuvant radiation, and antiestrogens.   RISK FACTORS:  Menarche was at age 60.  First live birth at age 36.  OCP use for approximately 0 years.  Ovaries intact: yes.  Hysterectomy: no.  Menopausal status: premenopausal.  HRT use: 0 years. Colonoscopy: no; not examined. Mammogram within the last year: yes. Up to date with pelvic exams: yes. Any excessive radiation exposure in the past: no  Past Medical History:  Diagnosis Date  . Asthma   . Family history of breast cancer 07/08/2020  . Family history of ovarian cancer 07/08/2020  . Genital herpes     Past Surgical History:  Procedure Laterality Date  . FOOT SURGERY Right   . MOUTH SURGERY Right     Social History   Socioeconomic History  . Marital status: Married    Spouse name: Not on file  . Number of children: Not on  file  . Years of education: Not on file  . Highest education level: Not on file  Occupational History  . Not on file  Tobacco Use  . Smoking status: Never Smoker  Substance and Sexual Activity  . Alcohol use: Yes    Comment: social  . Drug use: No  . Sexual activity: Yes  Other Topics Concern  . Not on file  Social History Narrative  . Not on file   Social Determinants of Health   Financial Resource Strain: Low Risk   . Difficulty of Paying Living Expenses: Not hard at all  Food Insecurity: No Food Insecurity  . Worried About Charity fundraiser in the Last Year: Never true  . Ran Out of Food in the Last Year: Never true  Transportation Needs: No Transportation Needs  . Lack of Transportation (Medical): No  . Lack of Transportation (Non-Medical): No  Physical Activity:   . Days of Exercise per Week: Not on file  . Minutes of Exercise per Session: Not on file  Stress:   . Feeling of Stress : Not on file  Social Connections:   . Frequency of Communication with Friends and Family: Not on file  . Frequency of Social Gatherings with Friends and Family: Not on file  . Attends Religious Services: Not on file  . Active Member of Clubs or Organizations: Not on file  . Attends Archivist Meetings: Not on file  . Marital Status: Not on file  FAMILY HISTORY:  We obtained a detailed, 4-generation family history.  Significant diagnoses are listed below: Family History  Problem Relation Age of Onset  . CVA Other   . Ovarian cancer Sister 29  . Breast cancer Other        maternal grandmother's sister, dx 55s, d. 20s     [name] [name] has one daughter, Lonn [place], who is 20 years old and does not have a history of cancer.  [name] [name] has 3 brothers and 4 sisters.  One of her brother passed away at age 17 unrelated to cancer.  One of her sisters was diagnosed with ovarian cancer at age 22 and is now 49 years old.  [name] [name] did not have additional information about her  sister's ovarian cancer, such as whether it was epithelial or germ cell.  [name] [name] parents are living at age 39 and 11.  [name] [name] maternal grandmother's sister was diagnosed with breast cancer in her 19s and passed away in her 26s.  [name] [name] did not report any other family history of cancer.   [name] [name] is unaware of previous family history of genetic testing for hereditary cancer risks. Patient's maternal ancestors are of African American descent, and paternal ancestors are of African American descent. There is no reported Ashkenazi Jewish ancestry. There is no known consanguinity.  GENETIC COUNSELING ASSESSMENT: [name] [name] is a 49 y.o. female with a personal history of breast cancer and family history of breast and ovarian cancers, which is somewhat suggestive of a hereditary cancer syndrome and predisposition to cancer given the presence of related cancers in multiple family members. We, therefore, discussed and recommended the following at today's visit.   DISCUSSION: We discussed that 5 - 10% of cancer is hereditary, with most cases of hereditary breast cancer associated with mutations in BRCA1 and BRCA2.  There are other genes that can be associated with hereditary breast cancer syndromes.  These include but are not limited to PALB2, CHEK2, and ATM.  We discussed that testing is beneficial for several reasons including knowing how to follow individuals after completing their treatment, identifying whether potential treatment options would be beneficial, and understand if other family members could be at risk for cancer and allow them to undergo genetic testing.   We reviewed the characteristics, features and inheritance patterns of hereditary cancer syndromes. We also discussed genetic testing, including the appropriate family members to test, the process of testing, insurance coverage, and turn-around-time for results. We discussed the implications of a negative, positive, carrier and/or  variant of uncertain significant result. We recommended [name] [name] pursue genetic testing for a panel that includes genes associated with breast and ovarian cancer genes.   Based on [name] [name] personal history of breast cancer and family history of breast and ovarian cancer, she meets medical criteria for genetic testing.  We talked in depth regarding the benefits of genetic testing, including obtaining information that could aid in surgical decisions, providing information about additional personal cancer risks, and informing cancer risks for family members.  After considering the benefits and limitations of genetic testing, [name] [name] opted to not pursue genetic testing at today's visit.  She explained that genetic testing could be something that she would consider in the future.    PLAN: Despite our recommendation, [name] [name] did not wish to pursue genetic testing at today's visit. We understand this decision and remain available to coordinate genetic testing at any time in the future. We, therefore, recommend [name] [name] continue to  follow the cancer screening guidelines given by her oncology and primary healthcare provider.  Based on [name] [name] family history, we recommended her sister, who was diagnosed with ovarian cancer at age 81, have genetic counseling and testing, especially if her cancer was epithelial in origin. Ms. Zettlemoyer will let us know if we can be of any assistance in coordinating genetic counseling and/or testing for this family member.   Lastly, we encouraged Ms. Fitchett to remain in contact with cancer genetics annually so that we can continuously update the personal family history and inform her of any changes in cancer genetics and testing that may be of benefit for this family.   Ms. Truss's questions were answered to her satisfaction today. Our contact information was provided should additional questions or concerns arise. Thank you for the referral and allowing [place] to share in  the care of your patient.   Myan Locatelli M. Joette Catching, [email or web address] (P) [phone]  The patient was seen for a total of 40 minutes in virtual genetic counseling.  This patient was discussed with Drs. Magrinat, Lindi Adie and/or Burr Medico who agrees with the above.    _______________________________________________________________________ For Office Staff:  Number of people involved in session: 1 Was an Intern/ student involved with case: no

## 2020-07-12 ENCOUNTER — Encounter: Payer: Self-pay | Admitting: *Deleted

## 2020-07-12 ENCOUNTER — Telehealth: Payer: Self-pay | Admitting: *Deleted

## 2020-07-12 NOTE — Telephone Encounter (Signed)
Spoke with patient to follow from [facility] last week.  Patient states she forgot to tell the team that she is currently taking a birth control pill for menstrual irregularity called levonorgestrel.  Informed her I would check with Dr. Jana Hakim but that she may need to stop taking this.  Message sent to Dr. Jana Hakim.  Encourage her to call with any other needs or concerns.

## 2020-07-13 ENCOUNTER — Encounter: Payer: Self-pay | Admitting: General Practice

## 2020-07-13 ENCOUNTER — Other Ambulatory Visit: Payer: Self-pay

## 2020-07-13 ENCOUNTER — Ambulatory Visit (HOSPITAL_COMMUNITY)
Admission: RE | Admit: 2020-07-13 | Discharge: 2020-07-13 | Disposition: A | Discharge status: Home / Self Care | Payer: 59 | Source: Ambulatory Visit | Attending: General Surgery | Admitting: General Surgery

## 2020-07-13 ENCOUNTER — Telehealth: Payer: Self-pay

## 2020-07-13 ENCOUNTER — Other Ambulatory Visit: Payer: Self-pay | Admitting: Oncology

## 2020-07-13 DIAGNOSIS — Z170 Estrogen receptor positive status [ER+]: Secondary | ICD-10-CM | POA: Diagnosis present

## 2020-07-13 DIAGNOSIS — C50811 Malignant neoplasm of overlapping sites of right female breast: Secondary | ICD-10-CM | POA: Insufficient documentation

## 2020-07-13 MED ORDER — GADOBUTROL 1 MMOL/ML IV SOLN
7.0000 mL | Freq: Once | INTRAVENOUS | Status: AC | PRN
  Administered 2020-07-13: 7 mL via INTRAVENOUS

## 2020-07-13 NOTE — Progress Notes (Signed)
[place] Spiritual Care Note  Dewayne Hatch by phone for [facility] follow-up at an inconvenient time, so we will keep trying.   [place], [place], [facility] Pager [phone] Voicemail [phone]

## 2020-07-13 NOTE — Progress Notes (Signed)
[place] Spiritual Care Note  Followed up with [facility] by phone as planned. She notes that she is coping by "compartmentalizing" and "staying present" with the task at hand--work is helping her stay busy and focused. Provided empathic listening, normalization of feelings, and spiritual/emotional support. We plan to connect again next week for a pastoral check-in.   [place], [place], [facility] Pager [phone] Voicemail [phone]

## 2020-07-13 NOTE — Telephone Encounter (Signed)
Nutrition Assessment  Reason for Assessment:  Pt attended Breast Clinic on 07/06/2020 and was given nutrition packet by nurse navigator.   ASSESSMENT:  49 year old female with new diagnosis of breast cancer.  Planning lumpectomy, oncotype, radiation and antiestrogens.  Past medical history reviewed.   Spoke with patient via phone to introduce self and service at [facility].  Patient reports that she is overwhelmed with diagnosis.  Reports good appetite.  Trying to figure out what caused the cancer.   Medications:  reviewed  Labs: reviewed  Anthropometrics:   Height: 62 inches Weight: 147 lb BMI: 27   NUTRITION DIAGNOSIS: Food and nutrition related knowledge deficit related to new diagnosis of breast cancer as evidenced by no prior need for nutrition related information.  INTERVENTION:   Discussed briefly packet of information regarding nutritional tips for breast cancer patients.  Questions answered.  Contact information provided and patient knows to contact me with questions/concerns.     MONITORING, EVALUATION, and GOAL: Pt will consume a healthy plant based diet to maintain lean body mass throughout treatment.   [name] [name] B. Zenia Resides, [place], [place] Registered Dietitian [phone] (mobile)

## 2020-07-14 ENCOUNTER — Encounter: Payer: Self-pay | Admitting: *Deleted

## 2020-07-18 ENCOUNTER — Other Ambulatory Visit: Payer: Self-pay

## 2020-07-18 ENCOUNTER — Other Ambulatory Visit: Payer: Self-pay | Admitting: General Surgery

## 2020-07-18 ENCOUNTER — Encounter (HOSPITAL_COMMUNITY): Payer: Self-pay

## 2020-07-18 ENCOUNTER — Ambulatory Visit (HOSPITAL_COMMUNITY)
Admission: RE | Admit: 2020-07-18 | Discharge: 2020-07-18 | Disposition: A | Discharge status: Home / Self Care | Payer: 59 | Source: Ambulatory Visit | Attending: Oncology | Admitting: Oncology

## 2020-07-18 DIAGNOSIS — C50811 Malignant neoplasm of overlapping sites of right female breast: Secondary | ICD-10-CM

## 2020-07-18 DIAGNOSIS — R9389 Abnormal findings on diagnostic imaging of other specified body structures: Secondary | ICD-10-CM

## 2020-07-18 DIAGNOSIS — Z170 Estrogen receptor positive status [ER+]: Secondary | ICD-10-CM | POA: Insufficient documentation

## 2020-07-18 MED ORDER — IOHEXOL 300 MG/ML  SOLN
75.0000 mL | Freq: Once | INTRAMUSCULAR | Status: AC | PRN
  Administered 2020-07-18: 75 mL via INTRAVENOUS

## 2020-07-19 ENCOUNTER — Telehealth: Payer: Self-pay | Admitting: *Deleted

## 2020-07-19 NOTE — Telephone Encounter (Signed)
Called pt with CT scan result of chest. Discussed next step is sx on 9/9. Denies further questions or needs at this time.

## 2020-07-20 ENCOUNTER — Telehealth: Payer: Self-pay | Admitting: *Deleted

## 2020-07-20 NOTE — Telephone Encounter (Signed)
VM returned per pt's inquiry about " brochure that discussed issue with arm and since I had my CT my hand feels like it has pins and needles "  Obtained pt's identified VM- message left call being returned and to call again to this RN or if she has navigator's contact may call them.  This RN's name and return call number given.

## 2020-07-20 NOTE — Pre-Procedure Instructions (Signed)
[id number] - [place],  [place] [address] [place] [address] Phone: [phone] Fax: [phone]  [id number] - [place], [place]. AT [place]. [place] [address] Phone: [phone] Fax: [phone]    Your procedure is scheduled on Thurs., Sept. 9, 2021 from 12:30PM-2:00PM  Report to [facility] Entrance "A" at 10:30AM  Call this number if you have problems the morning of surgery:  [phone]   Remember:  Do not eat after midnight on Sept. 8th  You may drink clear liquids until 3 hours (9:30AM) prior to surgery time.  Clear liquids allowed are:  Water, Non-Citric Juice (no pulp), Black Coffee Only (no creamer or dairy), Clear Tea (no creamer or dairy), Carbonated Beverages, Gatorade, Plain Jell-O, and Plain Popsicles.    Take these medicines the morning of surgery with A SIP OF WATER: Levonorgestrel-ethinyl estradiol (SEASONALE)         If Needed: Tetrahydrozoline HCl (VISINE OP)  As of today, STOP taking all Aspirin (unless instructed by your doctor) and Other Aspirin containing products, Vitamins, Fish oils, and Herbal medications. Also stop all NSAIDS i.e. Advil, Ibuprofen, Motrin, Aleve, Anaprox, Naproxen, BC, Goody Powders, and all Supplements.  No Smoking of any kind, Tobacco/Vaping, or Alcohol products 24 hours prior to your procedure. If you use a Cpap at night, you may bring all equipment for your overnight stay.   Special instructions:  [facility]- Preparing For Surgery  Before surgery, you can play an important role. Because skin is not sterile, your skin needs to be as free of germs as possible. You can reduce the number of germs on your skin by washing with CHG (chlorahexidine gluconate) Soap before surgery.  CHG is an antiseptic cleaner which kills germs and bonds with the skin to continue killing  germs even after washing.   Please do not use if you have an allergy to CHG or antibacterial soaps. If your skin becomes reddened/irritated stop using the CHG.  Do not shave (including legs and underarms) for at least 48 hours prior to first CHG shower. It is OK to shave your face.  Please follow these instructions carefully.   1. Shower the NIGHT BEFORE SURGERY and the MORNING OF SURGERY with CHG.   2. If you chose to wash your hair, wash your hair first as usual with your normal shampoo.  3. After you shampoo, rinse your hair and body thoroughly to remove the shampoo.  4. Use CHG as you would any other liquid soap. You can apply CHG directly to the skin and wash gently with a scrungie or a clean washcloth.   5. Apply the CHG Soap to your body ONLY FROM THE NECK DOWN.  Do not use on open wounds or open sores. Avoid contact with your eyes, ears, mouth and genitals (private parts). Wash Face and genitals (private parts)  with your normal soap.  6. Wash thoroughly, paying special attention to the area where your surgery will be performed.  7. Thoroughly rinse your body with warm water from the neck down.  8. DO NOT shower/wash with your normal soap after using and rinsing off the CHG Soap.  9. Pat yourself dry with a CLEAN TOWEL.  10. Wear CLEAN PAJAMAS to bed the night before surgery, wear comfortable clothes the morning of surgery  11. Place CLEAN SHEETS on your bed the night of your first shower and  DO NOT SLEEP WITH PETS.   Day of Surgery:             Remember to brush your teeth WITH YOUR REGULAR TOOTHPASTE.  Do not wear jewelry, make-up or nail polish.  Do not wear lotions, powders, or perfumes, or deodorant.  Do not shave 48 hours prior to surgery.    Do not bring valuables to the hospital.  [facility] is not responsible for any belongings or valuables.  Contacts, dentures or bridgework may not be worn into surgery.   For patients admitted to the hospital, discharge time  will be determined by your treatment team.  Patients discharged the day of surgery will not be allowed to drive home, and someone age 73 and over needs to stay with them for 24 hours.   Please wear clean clothes to the hospital/surgery center.    Please read over the following fact sheets that you were given.

## 2020-07-21 ENCOUNTER — Other Ambulatory Visit: Payer: Self-pay

## 2020-07-21 ENCOUNTER — Encounter (HOSPITAL_COMMUNITY)
Admission: RE | Admit: 2020-07-21 | Discharge: 2020-07-21 | Disposition: A | Discharge status: Home / Self Care | Payer: 59 | Source: Ambulatory Visit | Attending: General Surgery | Admitting: General Surgery

## 2020-07-21 ENCOUNTER — Encounter (HOSPITAL_COMMUNITY): Payer: Self-pay

## 2020-07-21 DIAGNOSIS — Z01818 Encounter for other preprocedural examination: Secondary | ICD-10-CM | POA: Insufficient documentation

## 2020-07-21 HISTORY — DX: Malignant (primary) neoplasm, unspecified: C80.1

## 2020-07-21 LAB — CBC
HCT: 40.5 % (ref 36.0–46.0)
Hemoglobin: 13.4 g/dL (ref 12.0–15.0)
MCH: 31.5 pg (ref 26.0–34.0)
MCHC: 33.1 g/dL (ref 30.0–36.0)
MCV: 95.3 fL (ref 80.0–100.0)
Platelets: 332 10*3/uL (ref 150–400)
RBC: 4.25 MIL/uL (ref 3.87–5.11)
RDW: 11.8 % (ref 11.5–15.5)
WBC: 5.2 10*3/uL (ref 4.0–10.5)
nRBC: 0 % (ref 0.0–0.2)

## 2020-07-21 LAB — POCT PREGNANCY, URINE: Preg Test, Ur: NEGATIVE

## 2020-07-21 NOTE — Progress Notes (Signed)
PCP:  Milagros Evener, MD Cardiologist:  Kirk Ruths, MD.  Saw in 2015 for [facility] and murmur  EKG:  07/21/20 CXR:  N/A ECHO:  03/15/14 Stress Test:  Denies Cardiac Cath:  denies  Covid test 07/26/20  Patient denies shortness of breath, fever, cough, and chest pain at PAT appointment.  Patient verbalized understanding of instructions provided today at the PAT appointment.  Patient asked to review instructions at home and day of surgery.

## 2020-07-22 ENCOUNTER — Encounter: Payer: Self-pay | Admitting: General Practice

## 2020-07-22 ENCOUNTER — Ambulatory Visit
Admission: RE | Admit: 2020-07-22 | Discharge: 2020-07-22 | Disposition: A | Discharge status: Home / Self Care | Payer: 59 | Source: Ambulatory Visit | Attending: General Surgery | Admitting: General Surgery

## 2020-07-22 ENCOUNTER — Other Ambulatory Visit: Payer: Self-pay

## 2020-07-22 ENCOUNTER — Encounter: Payer: Self-pay | Admitting: *Deleted

## 2020-07-22 DIAGNOSIS — R9389 Abnormal findings on diagnostic imaging of other specified body structures: Secondary | ICD-10-CM

## 2020-07-22 MED ORDER — GADOBUTROL 1 MMOL/ML IV SOLN
7.0000 mL | Freq: Once | INTRAVENOUS | Status: AC | PRN
  Administered 2020-07-22: 7 mL via INTRAVENOUS

## 2020-07-22 NOTE — Anesthesia Preprocedure Evaluation (Addendum)
Anesthesia Evaluation  Patient identified by MRN, date of birth, ID band  Reviewed: reviewed documented beta blocker date and time   Airway Mallampati: II  TM Distance: >3 FB Neck ROM: Full    Dental no notable dental hx.    Pulmonary neg pulmonary ROS,    Pulmonary exam normal        Cardiovascular negative cardio ROS   Rhythm:Regular Rate:Normal     Neuro/Psych negative neurological ROS  negative psych ROS   GI/Hepatic negative GI ROS, Neg liver ROS,   Endo/Other  negative endocrine ROS  Renal/GU negative Renal ROS     Musculoskeletal negative musculoskeletal ROS (+)   Abdominal Normal abdominal exam  (+)   Peds negative pediatric ROS (+)  Hematology negative hematology ROS (+)   Anesthesia Other Findings Right breast IDC  Reproductive/Obstetrics negative OB ROS                             Anesthesia Physical Anesthesia Plan  ASA: II  Anesthesia Plan: General and Regional   Post-op Pain Management:  Regional for Post-op pain   Induction: Intravenous  PONV Risk Score and Plan: 2 and Ondansetron and Dexamethasone  Airway Management Planned: LMA and Mask  Additional Equipment: None  Intra-op Plan:   Post-operative Plan: Extubation in OR  Informed Consent: I have reviewed the patients History and Physical, chart, labs and discussed the procedure including the risks, benefits and alternatives for the proposed anesthesia with the patient or authorized representative who has indicated his/her understanding and acceptance.       Plan Discussed with:   Anesthesia Plan Comments: (PAT note written 07/22/2020 by Myra Gianotti, PA-C. Lab Results      Component                Value               Date                      WBC                      5.2                 07/21/2020                HGB                      13.4                07/21/2020                HCT                       40.5                07/21/2020                MCV                      95.3                07/21/2020                PLT                      332  07/21/2020          )       Anesthesia Quick Evaluation

## 2020-07-22 NOTE — Progress Notes (Signed)
Anesthesia Chart Review:  Case: [id number] Date/Time: 07/28/20 1215   Procedure: RIGHT BREAST LUMPECTOMY X 2 WITH RADIOACTIVE SEED AND SENTINEL LYMPH NODE BIOPSY (Right Breast) - PEC BLOCK   Anesthesia type: General   Pre-op diagnosis: RIGHT BREAST CANCER   Location: [place] OR ROOM 02 / [place] OR   Surgeons: Jovita Kussmaul, MD      DISCUSSION: Patient is a 49 year old female scheduled for the above procedure.  History includes never smoker, asthma, right breast cancer (06/2020).  Urine pregnancy test negative on 07/22/2020.  COVID-19 test is scheduled for 07/26/2020.  RLS scheduled for 07/28/20. Anesthesia team to evaluate on the day of surgery.   VS: BP 120/88   Pulse 67   Temp 36.7 C (Oral)   Resp 18   Ht 5\' 1"  (1.549 m)   Wt 66.8 kg   LMP 07/09/2020   SpO2 100%   BMI 27.83 kg/m    PROVIDERS: [name], [name], MD is PCP Magrinat, Sarajane Jews, MD is Edson Snowball, MD is RAD-ONC -She is not followed routinely by cardiology but saw Kirk Ruths, MD in 2015 for evaluation of bradycardia ("when falling asleep") and murmur.  Only mild TR noted on echo. By notes, monitor showed "sinus with pac's and brief PAT".   LABS: Labs reviewed: Acceptable for surgery. (all labs ordered are listed, but only abnormal results are displayed)  Labs Reviewed  CBC  POCT PREGNANCY, URINE     IMAGES: CT Chest 07/18/20: IMPRESSION: 1. 11 mm soft tissue nodule inferior right breast with localization marker, likely site of primary disease. Borderline enlarged right axillary lymph nodes, indeterminate. 2. No other evidence for metastatic disease in the thorax.   MRI bilateral breasts 07/13/20: IMPRESSION: - Known sites of malignancy in the LATERAL portion of the RIGHT breast measure 1.8 and 2.5 centimeters. - Suspicious mass in the posterior LOWER OUTER QUADRANT of the RIGHT breast warranting tissue diagnosis. - Post biopsy change in the RIGHT axilla. - LEFT breast is  negative. RECOMMENDATION: - Recommend targeted ultrasound of the RIGHT breast to determine if there is a correlate for the mass in the posterior LOWER OUTER QUADRANT of the RIGHT breast. If no sonographic correlate is identified, MR guided core biopsy is recommended. - Treatment plan for multifocal RIGHT breast malignancy. - BI-RADS CATEGORY  4: Suspicious. - 07/22/20: S/p MRI guided biopsy of enhancing mass in the posterior LOWER OUTER QUADRANT RIGHT breast, marked with a barbell shaped clip no apparent Complications.   US Thyroid 04/28/20: IMPRESSION: Normal-sized thyroid gland without evidence for distinct thyroid nodule that meets criteria for follow-up or FNA.   EKG: 07/21/20: NSR   CV: Echo 03/15/20: Study Conclusions  - Left ventricle: The cavity size was normal. Wall thickness  was normal. Systolic function was normal. The estimated  ejection fraction was in the range of 55% to 60%. Wall  motion was normal; there were no regional wall motion  abnormalities. Left ventricular diastolic function  parameters were normal.  - Left atrium: The atrium was at the upper limits of normal  in size.  - Tricuspid valve: Mild regurgitation.  - Pulmonary arteries: PA peak pressure: 46mm Hg (S).  - Inferior vena cava: The vessel was normal in size; the  respirophasic diameter changes were in the normal range (=  50%); findings are consistent with normal central venous  pressure.  - Pericardium, extracardiac: There was no pericardial  effusion.   24 Hour Holter monitor 03/15/14: Per 03/19/14 notation by Fredia Beets, RN, "  Monitor reviewed by dr Stanford Breed shows sinus with pac's and brief PAT".   Past Medical History:  Diagnosis Date  . Asthma   . Cancer ([place])   . Family history of breast cancer 07/08/2020  . Family history of ovarian cancer 07/08/2020  . Genital herpes     Past Surgical History:  Procedure Laterality Date  . FOOT SURGERY Right   . MOUTH SURGERY  Right     MEDICATIONS: . BLACK CURRANT SEED OIL PO  . cholecalciferol (VITAMIN D3) 25 MCG (1000 UNIT) tablet  . Coenzyme Q10 (COQ-10) 100 MG CAPS  . ibuprofen (ADVIL) 200 MG tablet  . levonorgestrel-ethinyl estradiol (SEASONALE) 0.15-0.03 MG tablet  . Omega 3-6-9 Fatty Acids (OMEGA 3-6-9 COMPLEX PO)  . OVER THE COUNTER MEDICATION  . Prenatal Vit-Fe Fumarate-FA (PRENATAL MULTIVITAMIN) TABS tablet  . Tetrahydrozoline HCl (VISINE OP)  . vitamin E 180 MG (400 UNITS) capsule   No current facility-administered medications for this encounter.    Myra Gianotti, PA-C Surgical Short Stay/Anesthesiology [facility] Phone [phone] [facility] Phone [phone] 07/22/2020 2:04 PM

## 2020-07-22 NOTE — Progress Notes (Signed)
[place] Spiritual Care Note  Dewayne Hatch by phone for follow-up support. Assisted in brainstorming coping tools, particularly meaningful distractions that she might use on her week off next week if she finds herself becoming distressed. She plans to create a "wish list" of simple activities that she can turn to if needed. To understand her situation, she is using the metaphor of being on a train, in which God is the Teacher, adult education (with assistance from her medical team, the experts who are very experienced in helping people through cancer); her only job is to be the passenger (not the Set designer, for example), to be the patient who receives the care along the way.  We plan to follow up by phone in ca two weeks.   [place], [place], [facility] Pager [phone] Voicemail [phone]

## 2020-07-26 ENCOUNTER — Encounter: Payer: Self-pay | Admitting: *Deleted

## 2020-07-26 ENCOUNTER — Other Ambulatory Visit (HOSPITAL_COMMUNITY)
Admission: RE | Admit: 2020-07-26 | Discharge: 2020-07-26 | Disposition: A | Discharge status: Home / Self Care | Payer: 59 | Source: Ambulatory Visit | Attending: General Surgery | Admitting: General Surgery

## 2020-07-26 DIAGNOSIS — Z01812 Encounter for preprocedural laboratory examination: Secondary | ICD-10-CM | POA: Diagnosis not present

## 2020-07-26 DIAGNOSIS — Z20822 Contact with and (suspected) exposure to covid-19: Secondary | ICD-10-CM | POA: Insufficient documentation

## 2020-07-26 LAB — SARS CORONAVIRUS 2 (TAT 6-24 HRS): SARS Coronavirus 2: NEGATIVE

## 2020-07-28 ENCOUNTER — Ambulatory Visit (HOSPITAL_COMMUNITY): Payer: 59 | Admitting: Certified Registered"

## 2020-07-28 ENCOUNTER — Ambulatory Visit (HOSPITAL_COMMUNITY): Payer: 59 | Admitting: Vascular Surgery

## 2020-07-28 ENCOUNTER — Encounter: Payer: Self-pay | Admitting: Hematology and Oncology

## 2020-07-28 ENCOUNTER — Other Ambulatory Visit: Payer: Self-pay

## 2020-07-28 ENCOUNTER — Encounter (HOSPITAL_COMMUNITY)
Admission: RE | Disposition: A | Discharge status: Home / Self Care | Payer: Self-pay | Source: Ambulatory Visit | Attending: General Surgery

## 2020-07-28 ENCOUNTER — Ambulatory Visit (HOSPITAL_COMMUNITY)
Admission: RE | Admit: 2020-07-28 | Discharge: 2020-07-28 | Disposition: A | Discharge status: Home / Self Care | Payer: 59 | Source: Ambulatory Visit | Attending: General Surgery | Admitting: General Surgery

## 2020-07-28 DIAGNOSIS — Z170 Estrogen receptor positive status [ER+]: Secondary | ICD-10-CM | POA: Insufficient documentation

## 2020-07-28 DIAGNOSIS — C50511 Malignant neoplasm of lower-outer quadrant of right female breast: Secondary | ICD-10-CM | POA: Diagnosis not present

## 2020-07-28 DIAGNOSIS — J45909 Unspecified asthma, uncomplicated: Secondary | ICD-10-CM | POA: Diagnosis not present

## 2020-07-28 DIAGNOSIS — C50811 Malignant neoplasm of overlapping sites of right female breast: Secondary | ICD-10-CM

## 2020-07-28 DIAGNOSIS — C50911 Malignant neoplasm of unspecified site of right female breast: Secondary | ICD-10-CM | POA: Diagnosis present

## 2020-07-28 HISTORY — PX: BREAST LUMPECTOMY WITH RADIOACTIVE SEED AND SENTINEL LYMPH NODE BIOPSY: SHX6550

## 2020-07-28 LAB — POCT PREGNANCY, URINE: Preg Test, Ur: NEGATIVE

## 2020-07-28 SURGERY — BREAST LUMPECTOMY WITH RADIOACTIVE SEED AND SENTINEL LYMPH NODE BIOPSY
Anesthesia: Regional | Site: Breast | Laterality: Right

## 2020-07-28 MED ORDER — OXYCODONE HCL 5 MG/5ML PO SOLN: 5.0000 mg | Freq: Once | ORAL | Status: DC | PRN

## 2020-07-28 MED ORDER — FENTANYL CITRATE (PF) 100 MCG/2ML IJ SOLN: 50.0000 ug | Freq: Once | INTRAMUSCULAR | Status: AC

## 2020-07-28 MED ORDER — ONDANSETRON HCL 4 MG/2ML IJ SOLN
INTRAMUSCULAR | Status: AC
  Filled 2020-07-28: qty 6

## 2020-07-28 MED ORDER — MIDAZOLAM HCL 2 MG/2ML IJ SOLN
INTRAMUSCULAR | Status: AC
  Filled 2020-07-28: qty 2

## 2020-07-28 MED ORDER — OXYCODONE HCL 5 MG PO TABS: 5.0000 mg | ORAL_TABLET | Freq: Once | ORAL | Status: DC | PRN

## 2020-07-28 MED ORDER — CELECOXIB 200 MG PO CAPS
ORAL_CAPSULE | ORAL | Status: AC
  Administered 2020-07-28: 200 mg via ORAL
  Filled 2020-07-28: qty 1

## 2020-07-28 MED ORDER — PROPOFOL 10 MG/ML IV BOLUS
INTRAVENOUS | Status: DC | PRN
  Administered 2020-07-28: 180 mg via INTRAVENOUS

## 2020-07-28 MED ORDER — DEXAMETHASONE SODIUM PHOSPHATE 10 MG/ML IJ SOLN
INTRAMUSCULAR | Status: AC
  Filled 2020-07-28: qty 3

## 2020-07-28 MED ORDER — MIDAZOLAM HCL 2 MG/2ML IJ SOLN: 2.0000 mg | Freq: Once | INTRAMUSCULAR | Status: AC

## 2020-07-28 MED ORDER — HYDROMORPHONE HCL 1 MG/ML IJ SOLN: 0.2500 mg | INTRAMUSCULAR | Status: DC | PRN

## 2020-07-28 MED ORDER — CHLORHEXIDINE GLUCONATE 0.12 % MT SOLN
OROMUCOSAL | Status: AC
  Administered 2020-07-28: 15 mL via OROMUCOSAL
  Filled 2020-07-28: qty 15

## 2020-07-28 MED ORDER — CEFAZOLIN SODIUM-DEXTROSE 2-4 GM/100ML-% IV SOLN
INTRAVENOUS | Status: AC
  Filled 2020-07-28: qty 100

## 2020-07-28 MED ORDER — ACETAMINOPHEN 500 MG PO TABS: 1000.0000 mg | ORAL_TABLET | ORAL | Status: AC

## 2020-07-28 MED ORDER — DEXAMETHASONE SODIUM PHOSPHATE 10 MG/ML IJ SOLN
INTRAMUSCULAR | Status: DC | PRN
  Administered 2020-07-28: 10 mg via INTRAVENOUS

## 2020-07-28 MED ORDER — CHLORHEXIDINE GLUCONATE CLOTH 2 % EX PADS: 6.0000 | MEDICATED_PAD | Freq: Once | CUTANEOUS | Status: DC

## 2020-07-28 MED ORDER — TECHNETIUM TC 99M SULFUR COLLOID FILTERED
1.0000 | Freq: Once | INTRAVENOUS | Status: AC | PRN
  Administered 2020-07-28: 1 via INTRADERMAL

## 2020-07-28 MED ORDER — METHYLENE BLUE 0.5 % INJ SOLN
INTRAVENOUS | Status: AC
  Filled 2020-07-28: qty 10

## 2020-07-28 MED ORDER — ORAL CARE MOUTH RINSE: 15.0000 mL | Freq: Once | OROMUCOSAL | Status: AC

## 2020-07-28 MED ORDER — SODIUM CHLORIDE (PF) 0.9 % IJ SOLN
INTRAMUSCULAR | Status: AC
  Filled 2020-07-28: qty 10

## 2020-07-28 MED ORDER — ONDANSETRON HCL 4 MG/2ML IJ SOLN
INTRAMUSCULAR | Status: DC | PRN
  Administered 2020-07-28: 4 mg via INTRAVENOUS

## 2020-07-28 MED ORDER — LIDOCAINE 2% (20 MG/ML) 5 ML SYRINGE
INTRAMUSCULAR | Status: AC
  Filled 2020-07-28: qty 5

## 2020-07-28 MED ORDER — CHLORHEXIDINE GLUCONATE 0.12 % MT SOLN: 15.0000 mL | Freq: Once | OROMUCOSAL | Status: AC

## 2020-07-28 MED ORDER — FENTANYL CITRATE (PF) 250 MCG/5ML IJ SOLN
INTRAMUSCULAR | Status: AC
  Filled 2020-07-28: qty 5

## 2020-07-28 MED ORDER — GLYCOPYRROLATE PF 0.2 MG/ML IJ SOSY
PREFILLED_SYRINGE | INTRAMUSCULAR | Status: AC
  Filled 2020-07-28: qty 1

## 2020-07-28 MED ORDER — ONDANSETRON HCL 4 MG/2ML IJ SOLN
4.0000 mg | Freq: Once | INTRAMUSCULAR | Status: AC | PRN
  Administered 2020-07-28: 4 mg via INTRAVENOUS

## 2020-07-28 MED ORDER — MIDAZOLAM HCL 2 MG/2ML IJ SOLN
INTRAMUSCULAR | Status: AC
  Administered 2020-07-28: 2 mg via INTRAVENOUS
  Filled 2020-07-28: qty 2

## 2020-07-28 MED ORDER — ONDANSETRON HCL 4 MG/2ML IJ SOLN
INTRAMUSCULAR | Status: AC
  Filled 2020-07-28: qty 2

## 2020-07-28 MED ORDER — EPHEDRINE 5 MG/ML INJ
INTRAVENOUS | Status: AC
  Filled 2020-07-28: qty 10

## 2020-07-28 MED ORDER — GABAPENTIN 100 MG PO CAPS
ORAL_CAPSULE | ORAL | Status: AC
  Administered 2020-07-28: 300 mg via ORAL
  Filled 2020-07-28: qty 3

## 2020-07-28 MED ORDER — PROPOFOL 10 MG/ML IV BOLUS
INTRAVENOUS | Status: AC
  Filled 2020-07-28: qty 20

## 2020-07-28 MED ORDER — FENTANYL CITRATE (PF) 250 MCG/5ML IJ SOLN
INTRAMUSCULAR | Status: DC | PRN
Start: 2020-07-28 — End: 2020-07-28
  Administered 2020-07-28: 100 ug via INTRAVENOUS
  Administered 2020-07-28: 25 ug via INTRAVENOUS
  Administered 2020-07-28: 50 ug via INTRAVENOUS

## 2020-07-28 MED ORDER — HYDROCODONE-ACETAMINOPHEN 5-325 MG PO TABS: 1.0000 | ORAL_TABLET | Freq: Four times a day (QID) | ORAL | 0 refills | Status: DC | PRN

## 2020-07-28 MED ORDER — ROCURONIUM BROMIDE 10 MG/ML (PF) SYRINGE
PREFILLED_SYRINGE | INTRAVENOUS | Status: AC
  Filled 2020-07-28: qty 10

## 2020-07-28 MED ORDER — CEFAZOLIN SODIUM-DEXTROSE 2-4 GM/100ML-% IV SOLN: 2.0000 g | INTRAVENOUS | Status: DC

## 2020-07-28 MED ORDER — LACTATED RINGERS IV SOLN: INTRAVENOUS | Status: DC

## 2020-07-28 MED ORDER — GABAPENTIN 300 MG PO CAPS: 300.0000 mg | ORAL_CAPSULE | ORAL | Status: AC

## 2020-07-28 MED ORDER — 0.9 % SODIUM CHLORIDE (POUR BTL) OPTIME
TOPICAL | Status: DC | PRN
  Administered 2020-07-28: 1000 mL

## 2020-07-28 MED ORDER — BUPIVACAINE HCL (PF) 0.5 % IJ SOLN
INTRAMUSCULAR | Status: DC | PRN
  Administered 2020-07-28: 30 mL via PERINEURAL

## 2020-07-28 MED ORDER — MIDAZOLAM HCL 5 MG/5ML IJ SOLN
INTRAMUSCULAR | Status: DC | PRN
  Administered 2020-07-28 (×2): 1 mg via INTRAVENOUS

## 2020-07-28 MED ORDER — LIDOCAINE IN D5W 4-5 MG/ML-% IV SOLN
INTRAVENOUS | Status: DC | PRN
  Administered 2020-07-28: 25 ug/kg/min via INTRAVENOUS

## 2020-07-28 MED ORDER — FENTANYL CITRATE (PF) 100 MCG/2ML IJ SOLN
INTRAMUSCULAR | Status: AC
  Administered 2020-07-28: 50 ug via INTRAVENOUS
  Filled 2020-07-28: qty 2

## 2020-07-28 MED ORDER — MEPERIDINE HCL 25 MG/ML IJ SOLN: 6.2500 mg | INTRAMUSCULAR | Status: DC | PRN

## 2020-07-28 MED ORDER — CELECOXIB 200 MG PO CAPS: 200.0000 mg | ORAL_CAPSULE | ORAL | Status: AC

## 2020-07-28 MED ORDER — BUPIVACAINE HCL (PF) 0.25 % IJ SOLN
INTRAMUSCULAR | Status: DC | PRN
  Administered 2020-07-28: 22 mL

## 2020-07-28 MED ORDER — BUPIVACAINE HCL (PF) 0.25 % IJ SOLN
INTRAMUSCULAR | Status: AC
  Filled 2020-07-28: qty 30

## 2020-07-28 MED ORDER — ACETAMINOPHEN 500 MG PO TABS
ORAL_TABLET | ORAL | Status: AC
  Administered 2020-07-28: 1000 mg via ORAL
  Filled 2020-07-28: qty 2

## 2020-07-28 SURGICAL SUPPLY — 41 items
ADH SKN CLS APL DERMABOND .7 (GAUZE/BANDAGES/DRESSINGS) ×1
APL PRP STRL LF DISP 70% ISPRP (MISCELLANEOUS) ×1
APPLIER CLIP 9.375 MED OPEN (MISCELLANEOUS) ×2
APR CLP MED 9.3 20 MLT OPN (MISCELLANEOUS) ×1
BINDER BREAST LRG (GAUZE/BANDAGES/DRESSINGS) IMPLANT
BINDER BREAST XLRG (GAUZE/BANDAGES/DRESSINGS) ×1 IMPLANT
CANISTER SUCT 3000ML PPV (MISCELLANEOUS) ×2 IMPLANT
CHLORAPREP W/TINT 26 (MISCELLANEOUS) ×2 IMPLANT
CLIP APPLIE 9.375 MED OPEN (MISCELLANEOUS) ×1 IMPLANT
CNTNR URN SCR LID CUP LEK RST (MISCELLANEOUS) ×1 IMPLANT
CONT SPEC 4OZ STRL OR WHT (MISCELLANEOUS) ×8
COVER PROBE W GEL 5X96 (DRAPES) ×2 IMPLANT
COVER SURGICAL LIGHT HANDLE (MISCELLANEOUS) ×2 IMPLANT
COVER WAND RF STERILE (DRAPES) IMPLANT
DERMABOND ADVANCED (GAUZE/BANDAGES/DRESSINGS) ×1
DERMABOND ADVANCED .7 DNX12 (GAUZE/BANDAGES/DRESSINGS) ×1 IMPLANT
DEVICE DUBIN SPECIMEN MAMMOGRA (MISCELLANEOUS) ×2 IMPLANT
DRAPE CHEST BREAST 15X10 FENES (DRAPES) ×2 IMPLANT
ELECT COATED BLADE 2.86 ST (ELECTRODE) ×2 IMPLANT
ELECT REM PT RETURN 9FT ADLT (ELECTROSURGICAL) ×2
ELECTRODE REM PT RTRN 9FT ADLT (ELECTROSURGICAL) ×1 IMPLANT
GAUZE SPONGE 4X4 12PLY STRL (GAUZE/BANDAGES/DRESSINGS) ×1 IMPLANT
GLOVE BIO SURGEON STRL SZ7.5 (GLOVE) ×4 IMPLANT
GOWN STRL REUS W/ TWL LRG LVL3 (GOWN DISPOSABLE) ×2 IMPLANT
GOWN STRL REUS W/TWL LRG LVL3 (GOWN DISPOSABLE) ×4
KIT BASIN OR (CUSTOM PROCEDURE TRAY) ×2 IMPLANT
KIT MARKER MARGIN INK (KITS) ×2 IMPLANT
NDL 18GX1X1/2 (RX/OR ONLY) (NEEDLE) IMPLANT
NDL FILTER BLUNT 18X1 1/2 (NEEDLE) IMPLANT
NDL HYPO 25GX1X1/2 BEV (NEEDLE) ×1 IMPLANT
NEEDLE 18GX1X1/2 (RX/OR ONLY) (NEEDLE) IMPLANT
NEEDLE FILTER BLUNT 18X 1/2SAF (NEEDLE)
NEEDLE FILTER BLUNT 18X1 1/2 (NEEDLE) IMPLANT
NEEDLE HYPO 25GX1X1/2 BEV (NEEDLE) ×2 IMPLANT
NS IRRIG 1000ML POUR BTL (IV SOLUTION) ×2 IMPLANT
PACK GENERAL/GYN (CUSTOM PROCEDURE TRAY) ×2 IMPLANT
SUT MNCRL AB 4-0 PS2 18 (SUTURE) ×5 IMPLANT
SUT VIC AB 3-0 SH 18 (SUTURE) ×3 IMPLANT
SYR CONTROL 10ML LL (SYRINGE) ×2 IMPLANT
TOWEL GREEN STERILE (TOWEL DISPOSABLE) ×2 IMPLANT
TOWEL GREEN STERILE FF (TOWEL DISPOSABLE) ×2 IMPLANT

## 2020-07-28 NOTE — H&P (Signed)
[name] [name]  Location: [facility] Patient #: [id number] DOB: 10/28/1971 Undefined / Language: Undefined / Race: Refused to Report/Unreported Female   History of Present Illness The patient is a 49 year old female who presents with breast cancer. We are asked to see the patient in consultation by Dr. Jana Hakim to evaluate her for a new right breast cancer. The patient is a 49 year old female who presents with a palpable mass in the LOQ of the right breast. This was not seen on mammogram in may. The patient first noticed the mass and indentation in the skin in April. on u/s there were 2 masses that were separated by 1.5cm. The 8 o clock mass measured 1.6cm and the 9 oclock mass measured 1.2cm. The axilla looked neg. 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 She is otherwise in good health and does not smoke.   Past Surgical History  Breast Biopsy  Right. Foot Surgery  Left. Oral Surgery   Diagnostic Studies History  Colonoscopy  never Mammogram  within last year Pap Smear  1-5 years ago  Medication History  Medications Reconciled  Social History  Alcohol use  Occasional alcohol use. Caffeine use  Coffee, Tea. No drug use  Tobacco use  Never smoker.  Family History  Arthritis  Family Members In General. Cervical Cancer  Sister. Diabetes Mellitus  Father. Hypertension  Mother. Ovarian Cancer  Sister.  Pregnancy / Birth History  Age at menarche  88 years. Contraceptive History  Oral contraceptives. Gravida  3 Length (months) of breastfeeding  3-6 Maternal age  56-20 Para  1 Regular periods   Other Problems  Anxiety Disorder  Asthma     Review of Systems General Present- Night Sweats and Weight Loss. Not Present- Appetite Loss, Chills, Fatigue, Fever and Weight Gain. HEENT Present- Wears glasses/contact lenses. Not Present- Earache, Hearing Loss, Hoarseness, Nose  Bleed, Oral Ulcers, Ringing in the Ears, Seasonal Allergies, Sinus Pain, Sore Throat, Visual Disturbances and Yellow Eyes. Respiratory Not Present- Bloody sputum, Chronic Cough, Difficulty Breathing, Snoring and Wheezing. Breast Present- Breast Mass and Breast Pain. Not Present- Nipple Discharge and Skin Changes. Cardiovascular Not Present- Chest Pain, Difficulty Breathing Lying Down, Leg Cramps, Palpitations, Rapid Heart Rate, Shortness of Breath and Swelling of Extremities. Gastrointestinal Present- Change in Bowel Habits. Not Present- Abdominal Pain, Bloating, Bloody Stool, Chronic diarrhea, Constipation, Difficulty Swallowing, Excessive gas, Gets full quickly at meals, Hemorrhoids, Indigestion, Nausea, Rectal Pain and Vomiting. Female Genitourinary Present- Frequency, Nocturia and Urgency. Not Present- Painful Urination and Pelvic Pain. Musculoskeletal Present- Swelling of Extremities. Not Present- Back Pain, Joint Pain, Joint Stiffness, Muscle Pain and Muscle Weakness. Neurological Present- Numbness and Tingling. Not Present- Decreased Memory, Fainting, Headaches, Seizures, Tremor, Trouble walking and Weakness. Psychiatric Present- Change in Sleep Pattern and Frequent crying. Not Present- Anxiety, Bipolar, Depression and Fearful. Endocrine Present- Hair Changes. Not Present- Cold Intolerance, Excessive Hunger, Heat Intolerance, Hot flashes and New Diabetes. Hematology Not Present- Blood Thinners, Easy Bruising, Excessive bleeding, Gland problems, HIV and Persistent Infections.   Physical Exam  General Mental Status-Alert. General Appearance-Consistent with stated age. Hydration-Well hydrated. Voice-Normal.  Head and Neck Head-normocephalic, atraumatic with no lesions or palpable masses. Trachea-midline. Thyroid Gland Characteristics - normal size and consistency.  Eye Eyeball - Bilateral-Extraocular movements intact. Sclera/Conjunctiva - Bilateral-No scleral  icterus.  Chest and Lung Exam Chest and lung exam reveals -quiet, even and easy  respiratory effort with no use of accessory muscles and on auscultation, normal breath sounds, no adventitious sounds and normal vocal resonance. Inspection Chest Wall - Normal. Back - normal.  Breast Note: There is one small palpable mass in the LOQ of the right breast that measures 1cm. The other is not palpable. There is no palpable mass in the left breast. There is one small mobile palpable lymph node in the right axilla but no other lymphadenopathy   Cardiovascular Cardiovascular examination reveals -normal heart sounds, regular rate and rhythm with no murmurs and normal pedal pulses bilaterally.  Abdomen Inspection Inspection of the abdomen reveals - No Hernias. Skin - Scar - no surgical scars. Palpation/Percussion Palpation and Percussion of the abdomen reveal - Soft, Non Tender, No Rebound tenderness, No Rigidity (guarding) and No hepatosplenomegaly. Auscultation Auscultation of the abdomen reveals - Bowel sounds normal.  Neurologic Neurologic evaluation reveals -alert and oriented x 3 with no impairment of recent or remote memory. Mental Status-Normal.  Musculoskeletal Normal Exam - Left-Upper Extremity Strength Normal and Lower Extremity Strength Normal. Normal Exam - Right-Upper Extremity Strength Normal and Lower Extremity Strength Normal.  Lymphatic Head & Neck  General Head & Neck Lymphatics: Bilateral - Description - Normal. Axillary  General Axillary Region: Bilateral - Description - Normal. Tenderness - Non Tender. Femoral & Inguinal  Generalized Femoral & Inguinal Lymphatics: Bilateral - Description - Normal. Tenderness - Non Tender.    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 I  have discussed with her the different options for treatment and at this point she favors breast conservation and sentinel node biopsy. I have discussed with her in detail the risks and benefits of the surgery as well as some of the technical aspects including the use of a seed for localization and she understands and wishes to proceed. We will get an MRI because there were 2 masses not seen on mammography and we don't want to miss anything. This patient encounter took 60 minutes today to perform the following: take history, perform exam, review outside records, interpret imaging, counsel the patient on their diagnosis and document encounter, findings & plan in the EHR Current Plans Referred to Oncology, for evaluation and follow up (Oncology). Routine.

## 2020-07-28 NOTE — Op Note (Signed)
07/28/2020  2:47 PM  PATIENT:  [name] [name]  49 y.o. female  PRE-OPERATIVE DIAGNOSIS:  RIGHT BREAST CANCER  POST-OPERATIVE DIAGNOSIS:  RIGHT BREAST CANCER  PROCEDURE:  Procedure(s) with comments: RIGHT BREAST LUMPECTOMY X 3  WITH RADIOACTIVE SEED AND SENTINEL LYMPH NODE BIOPSY (Right) - PEC BLOCK  SURGEON:  Surgeon(s) and Role:    * Jovita Kussmaul, MD - Primary  PHYSICIAN ASSISTANT:   ASSISTANTS: none   ANESTHESIA:   local and general  EBL:  Minimal   BLOOD ADMINISTERED:none  DRAINS: none   LOCAL MEDICATIONS USED:  MARCAINE     SPECIMEN:  Source of Specimen:  right breast tissue with additional medial and superior margin and sentinel node x 2  DISPOSITION OF SPECIMEN:  PATHOLOGY  COUNTS:  YES  TOURNIQUET:  * No tourniquets in log *  DICTATION: .Dragon Dictation   After informed consent was obtained the patient was brought to the operating room and placed in the supine position on the operating table.  After adequate induction of general anesthesia the patient's right chest, breast, and axillary area were prepped with ChloraPrep, allowed to dry, and draped in usual sterile manner.  An appropriate timeout was performed.  Previously 3 I-125 seeds were placed in the lower outer quadrant of the right breast to mark 3 areas of invasive breast cancer.  Also earlier in the day the patient underwent injection of 1 mCi of technetium sulfur colloid in the subareolar position on the right.  The neoprobe was first set to technetium.  An area of radioactivity was readily identified in the right axilla.  This area was infiltrated with quarter percent Marcaine.  A small transversely oriented incision was made with a 15 blade knife overlying the area of radioactivity.  The incision was carried through the skin and subcutaneous tissue sharply with the electrocautery until the deep right axillary space was entered.  Blunt hemostat dissection was carried out under the direction of the neoprobe.   I was able to identify 2 hot lymph nodes.  Each of these nodes was excised sharply with the electrocautery and the small surrounding lymphatics and vessels were controlled with clips.  Ex vivo counts on these nodes ranged from 500 to 5000.  No other hot or palpable nodes were identified in the right axilla.  The deep layer of the wound was closed with interrupted 3-0 Vicryl stitches.  The skin was closed with a running 4-0 Monocryl subcuticular stitch.  Attention was then turned to the right breast.  The neoprobe was set to I-125 and the 3 seeds were identified in the lower outer quadrant.  Because of the large area involvement needed to come out I decided to make an elliptical radially oriented incision in the skin overlying the area of radioactivity with a 15 blade knife.  The incision was carried through the skin and subcutaneous tissue sharply with the electrocautery.  Dissection was then carried widely around the 3 radioactive seeds in a radial fashion.  Once the specimen was removed it was oriented with the appropriate paint colors.  A specimen radiograph was obtained that showed all 3 clips and seeds to be nicely within the specimen.  The specimen was then sent to pathology for further evaluation.  During the dissection I did run into what looked like a small biopsy cavity on along the medial edge of the lumpectomy.  This medial edge of the lumpectomy cavity was excised and marked appropriately and sent to pathology.  I also removed an  additional superior margin near the nipple and areola and this was also marked appropriately and sent to pathology.  Hemostasis was achieved using the Bovie electrocautery.  The wound was irrigated with saline and infiltrated with more quarter percent Marcaine.  The deep layer of the wound was then closed with layers of interrupted 3-0 Vicryl stitches.  The skin was closed with interrupted 4-0 Monocryl subcuticular stitches.  Dermabond dressings were applied.  The patient  tolerated the procedure well.  At the end of the case all needle sponge and instrument counts were correct.  The patient was then awakened and taken recovery in stable condition.  PLAN OF CARE: Discharge to home after PACU  PATIENT DISPOSITION:  PACU - hemodynamically stable.   Delay start of Pharmacological VTE agent (>24hrs) due to surgical blood loss or risk of bleeding: not applicable

## 2020-07-28 NOTE — Transfer of Care (Signed)
Immediate Anesthesia Transfer of Care Note  Patient: [name] [name]  Procedure(s) Performed: RIGHT BREAST LUMPECTOMY X 3  WITH RADIOACTIVE SEED AND SENTINEL LYMPH NODE BIOPSY (Right Breast)  Patient Location: PACU  Anesthesia Type:GA combined with regional for post-op pain  Level of Consciousness: drowsy and patient cooperative  Airway & Oxygen Therapy: Patient Spontanous Breathing and Patient connected to face mask oxygen  Post-op Assessment: Report given to RN and Post -op Vital signs reviewed and stable  Post vital signs: Reviewed and stable  Last Vitals:  Vitals Value Taken Time  BP 132/92 07/28/20 1508  Temp    Pulse 87 07/28/20 1511  Resp 16 07/28/20 1511  SpO2 97 % 07/28/20 1511  Vitals shown include unvalidated device data.  Last Pain:  Vitals:   07/28/20 1508  TempSrc:   PainSc: (P) Asleep      Patients Stated Pain Goal: 3 (56/70/14 1030)  Complications: No complications documented.

## 2020-07-28 NOTE — Anesthesia Postprocedure Evaluation (Signed)
Anesthesia Post Note  Patient: Mirayah Wren  Procedure(s) Performed: RIGHT BREAST LUMPECTOMY X 3  WITH RADIOACTIVE SEED AND SENTINEL LYMPH NODE BIOPSY (Right Breast)     Patient location during evaluation: PACU Anesthesia Type: Regional and General Level of consciousness: awake Pain management: pain level controlled Vital Signs Assessment: post-procedure vital signs reviewed and stable Respiratory status: spontaneous breathing, nonlabored ventilation, respiratory function stable and patient connected to nasal cannula oxygen Cardiovascular status: blood pressure returned to baseline and stable Postop Assessment: no apparent nausea or vomiting Anesthetic complications: no   No complications documented.  Last Vitals:  Vitals:   07/28/20 1545 07/28/20 1552  BP:  (!) 138/98  Pulse: 76 84  Resp: 19 (!) 23  Temp:  36.5 C  SpO2: 100% 100%    Last Pain:  Vitals:   07/28/20 1552  TempSrc:   PainSc: 0-No pain                 Samiyah Stupka P Joetta Delprado

## 2020-07-28 NOTE — Anesthesia Procedure Notes (Addendum)
Anesthesia Regional Block: Pectoralis block   Pre-Anesthetic Checklist: ,, timeout performed, Correct Patient, Correct Site, Correct Laterality, Correct Procedure, Correct Position, site marked, Risks and benefits discussed,  Surgical consent,  Pre-op evaluation,  At surgeon's request and post-op pain management  Laterality: Right  Prep: Dura Prep       Needles:  Injection technique: Single-shot  Needle Type: Echogenic Stimulator Needle     Needle Length: 4cm  Needle Gauge: 20     Additional Needles:   Procedures:,,,, ultrasound used (permanent image in chart),,,,  Narrative:  Start time: 07/28/2020 12:35 PM End time: 07/28/2020 12:40 PM Injection made incrementally with aspirations every 5 mL.  Performed by: Personally  Anesthesiologist: Darral Dash, DO  Additional Notes: Patient identified. Risks/Benefits/Options discussed with patient including but not limited to bleeding, infection, nerve damage, failed block, incomplete pain control. Patient expressed understanding and wished to proceed. All questions were answered. Sterile technique was used throughout the entire procedure. Please see nursing notes for vital signs. Aspirated in 5cc intervals with injection for negative confirmation. Patient was given instructions on risks and not to get out of bed. All questions and concerns addressed with instructions to call with any issues or inadequate analgesia.

## 2020-07-28 NOTE — Anesthesia Procedure Notes (Signed)
Procedure Name: LMA Insertion Date/Time: 07/28/2020 1:31 PM Performed by: Cleda Daub, CRNA Pre-anesthesia Checklist: Patient identified, Emergency Drugs available, Patient being monitored and Suction available Patient Re-evaluated:Patient Re-evaluated prior to induction Oxygen Delivery Method: Circle system utilized Preoxygenation: Pre-oxygenation with 100% oxygen Induction Type: IV induction Ventilation: Mask ventilation without difficulty LMA: LMA inserted LMA Size: 4.0 Number of attempts: 1 Placement Confirmation: breath sounds checked- equal and bilateral and positive ETCO2 Tube secured with: Tape Dental Injury: Teeth and Oropharynx as per pre-operative assessment

## 2020-07-28 NOTE — Interval H&P Note (Signed)
History and Physical Interval Note:  07/28/2020 12:27 PM  [name] [name]  has presented today for surgery, with the diagnosis of RIGHT BREAST CANCER.  The various methods of treatment have been discussed with the patient and family. After consideration of risks, benefits and other options for treatment, the patient has consented to  Procedure(s) with comments: RIGHT BREAST LUMPECTOMY X 3  WITH RADIOACTIVE SEED AND SENTINEL LYMPH NODE BIOPSY (Right) - PEC BLOCK as a surgical intervention.  The patient's history has been reviewed, patient examined, no change in status, stable for surgery.  I have reviewed the patient's chart and labs.  Questions were answered to the patient's satisfaction.     [name]

## 2020-07-29 ENCOUNTER — Encounter (HOSPITAL_COMMUNITY): Payer: Self-pay | Admitting: General Surgery

## 2020-08-01 LAB — SURGICAL PATHOLOGY

## 2020-08-02 ENCOUNTER — Encounter: Payer: Self-pay | Admitting: *Deleted

## 2020-08-02 ENCOUNTER — Other Ambulatory Visit: Payer: Self-pay | Admitting: *Deleted

## 2020-08-02 ENCOUNTER — Encounter: Payer: Self-pay | Admitting: Plastic Surgery

## 2020-08-02 ENCOUNTER — Telehealth: Payer: Self-pay | Admitting: *Deleted

## 2020-08-02 ENCOUNTER — Ambulatory Visit: Payer: 59 | Admitting: Plastic Surgery

## 2020-08-02 ENCOUNTER — Other Ambulatory Visit: Payer: Self-pay

## 2020-08-02 VITALS — BP 114/78 | HR 79 | Temp 98.8°F

## 2020-08-02 DIAGNOSIS — C50811 Malignant neoplasm of overlapping sites of right female breast: Secondary | ICD-10-CM | POA: Diagnosis not present

## 2020-08-02 DIAGNOSIS — Z170 Estrogen receptor positive status [ER+]: Secondary | ICD-10-CM | POA: Diagnosis not present

## 2020-08-02 NOTE — Progress Notes (Signed)
Patient ID: [name] [name], female    DOB: May 22, 1971, 49 y.o.   MRN: [id number]   Chief Complaint  Patient presents with  . Consult  . Breast Problem    The patient is a 49 year old black female here with her husband for a consultation for breast reconstruction.  She underwent a screening mammogram in May 2021 that was negative but showed category C breast density.  She noticed some discomfort in her right breast when exercising and a palpable nontender lump.  She had an ultrasound in August which showed a 1.6 cm mass in the right breast at the 8 o'clock position and a 1.2 cm mass in the right breast at the 9 o'clock position.  The masses were 1.5 cm apart.  She also had an abnormal right axillary lymph node.  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  It was E-cadherin positive, grade 1-2 with estrogen receptor positive at 100% and progesterone receptor positive 90%.  Ki-67 was 2% and HER-2 was equivocal by immunochemistry but negative by fluorescent in situ hybridization.  The patient went on to be evaluated in the multidisciplinary clinic on August 18.  An MRI was planned to assess the extent of disease.  Patient decided not to wait for plastic surgery consultation and went ahead for a partial mastectomy/lumpectomy of the right breast.  This was done a week ago.  The area appears to be healing very nicely.  She has some swelling as expected and some bruising.  No sign of infection.  The incision is healing very nicely.  She has her breast binder on today.  She was a little confused as to why she was at my office but stated that she did not want to miss the appointment since it was on her calendar.  She is not a smoker.    Review of Systems  Constitutional: Positive for activity change. Negative for appetite change.  HENT: Negative.   Eyes: Negative.   Respiratory: Negative.   Cardiovascular: Negative.   Gastrointestinal: Negative.     Genitourinary: Negative.   Musculoskeletal: Negative.     Past Medical History:  Diagnosis Date  . Asthma   . Cancer ([place])   . Family history of breast cancer 07/08/2020  . Family history of ovarian cancer 07/08/2020  . Genital herpes     Past Surgical History:  Procedure Laterality Date  . BREAST LUMPECTOMY WITH RADIOACTIVE SEED AND SENTINEL LYMPH NODE BIOPSY Right 07/28/2020   Procedure: RIGHT BREAST LUMPECTOMY X 3  WITH RADIOACTIVE SEED AND SENTINEL LYMPH NODE BIOPSY;  Surgeon: Jovita Kussmaul, MD;  Location: [facility];  Service: General;  Laterality: Right;  PEC BLOCK  . FOOT SURGERY Right   . MOUTH SURGERY Right       Current Outpatient Medications:  .  OVER THE COUNTER MEDICATION, Take 2 capsules by mouth daily. The [facility] Healthy Hair otc supplement, Disp: , Rfl:  .  BLACK CURRANT SEED OIL PO, Take 1 Dose by mouth every Wednesday. Liquid (Patient not taking: Reported on 08/02/2020), Disp: , Rfl:  .  cholecalciferol (VITAMIN D3) 25 MCG (1000 UNIT) tablet, Take 2,000 Units by mouth every Tuesday. (Patient not taking: Reported on 08/02/2020), Disp: , Rfl:  .  Coenzyme Q10 (COQ-10) 100 MG CAPS, Take 100 mg by mouth every Friday. (Patient not taking: Reported on 08/02/2020), Disp: , Rfl:  .  HYDROcodone-acetaminophen (NORCO/VICODIN) 5-325 MG tablet, Take 1-2 tablets by mouth  every 6 (six) hours as needed for moderate pain or severe pain. (Patient not taking: Reported on 08/02/2020), Disp: 20 tablet, Rfl: 0 .  ibuprofen (ADVIL) 200 MG tablet, Take 200-400 mg by mouth every 6 (six) hours as needed for headache or moderate pain. (Patient not taking: Reported on 08/02/2020), Disp: , Rfl:  .  levonorgestrel-ethinyl estradiol (SEASONALE) 0.15-0.03 MG tablet, Take 1 tablet by mouth daily. (Patient not taking: Reported on 08/02/2020), Disp: , Rfl:  .  Omega 3-6-9 Fatty Acids (OMEGA 3-6-9 COMPLEX PO), Take 1 capsule by mouth every Sunday. (Patient not taking: Reported on 08/02/2020), Disp: , Rfl:  .   Prenatal Vit-Fe Fumarate-FA (PRENATAL MULTIVITAMIN) TABS tablet, Take 1 tablet by mouth every Monday.  (Patient not taking: Reported on 08/02/2020), Disp: , Rfl:  .  Tetrahydrozoline HCl (VISINE OP), Place 1 drop into both eyes daily as needed (redness). (Patient not taking: Reported on 08/02/2020), Disp: , Rfl:  .  vitamin E 180 MG (400 UNITS) capsule, Take 400 Units by mouth every Thursday. (Patient not taking: Reported on 08/02/2020), Disp: , Rfl:    Objective:   Vitals:   08/02/20 1506  BP: 114/78  Pulse: 79  Temp: 98.8 F (37.1 C)  SpO2: 100%    Physical Exam Vitals and nursing note reviewed.  Constitutional:      Appearance: Normal appearance.  HENT:     Head: Normocephalic and atraumatic.  Cardiovascular:     Rate and Rhythm: Normal rate.     Pulses: Normal pulses.  Pulmonary:     Effort: Pulmonary effort is normal. No respiratory distress.  Chest:    Abdominal:     General: Abdomen is flat. There is no distension.  Skin:    General: Skin is warm.  Neurological:     General: No focal deficit present.     Mental Status: She is alert.  Psychiatric:        Mood and Affect: Mood normal.        Behavior: Behavior normal.        Thought Content: Thought content normal.     Assessment & Plan:  Malignant neoplasm of overlapping sites of right breast in female, estrogen receptor positive ([place])  Once we were able to discuss the patient's current course of action we were able to proceed with the educational aspect of the consultation.  The patient was unsure of the next steps involved.  At this point she is healing very nicely.  She should continue to focus on healthy lifestyle and healthy diet.  Eliminating and limiting her sugar and carbohydrate intake will be very helpful for her healing as she proceeds through the post lumpectomy.  And begins radiation.  At this point I cannot say what will be needed as we will have to wait and see how her body responds to the radiation  treatment.  I assured her that I am happy to meet with her again in several months.  We can reevaluate where she is during the radiation process and discuss further options.  Generally speaking if she goes through radiation and it heals without any indentation or significant change in her volume she will not need any surgery.  Another option might be having a lift on the contralateral side.  If she gets a lot of indentation from the radiation certainly contracture release and fat filling is an option.  I think that this was helpful for the patient and I enjoyed meeting her.  I will plan to see  her back in 4 to 5 months.  She should be done with radiation by this time.  I also stated that she should not consider any surgery before 6 months of her last radiation treatment and 12 months is even more ideal.  She is welcome to call earlier if she has any questions or concerns.  [place], DO

## 2020-08-02 NOTE — Telephone Encounter (Signed)
Ordered oncotype per Dr. Magrinat.  Faxed requisition to pathology. 

## 2020-08-03 ENCOUNTER — Inpatient Hospital Stay (HOSPITAL_BASED_OUTPATIENT_CLINIC_OR_DEPARTMENT_OTHER): Payer: 59

## 2020-08-03 ENCOUNTER — Encounter: Payer: Self-pay | Admitting: General Practice

## 2020-08-03 ENCOUNTER — Other Ambulatory Visit: Payer: Self-pay

## 2020-08-03 ENCOUNTER — Inpatient Hospital Stay: Payer: 59 | Attending: Oncology | Admitting: Adult Health

## 2020-08-03 VITALS — BP 126/82 | HR 83 | Temp 97.7°F | Resp 18 | Ht 62.0 in | Wt 149.2 lb

## 2020-08-03 DIAGNOSIS — C50811 Malignant neoplasm of overlapping sites of right female breast: Secondary | ICD-10-CM | POA: Insufficient documentation

## 2020-08-03 DIAGNOSIS — Z170 Estrogen receptor positive status [ER+]: Secondary | ICD-10-CM | POA: Insufficient documentation

## 2020-08-03 LAB — CBC WITH DIFFERENTIAL (CANCER CENTER ONLY)
Abs Immature Granulocytes: 0.01 10*3/uL (ref 0.00–0.07)
Basophils Absolute: 0.1 10*3/uL (ref 0.0–0.1)
Basophils Relative: 1 %
Eosinophils Absolute: 0.1 10*3/uL (ref 0.0–0.5)
Eosinophils Relative: 1 %
HCT: 37.6 % (ref 36.0–46.0)
Hemoglobin: 13.1 g/dL (ref 12.0–15.0)
Immature Granulocytes: 0 %
Lymphocytes Relative: 41 %
Lymphs Abs: 2.5 10*3/uL (ref 0.7–4.0)
MCH: 31.3 pg (ref 26.0–34.0)
MCHC: 34.8 g/dL (ref 30.0–36.0)
MCV: 89.7 fL (ref 80.0–100.0)
Monocytes Absolute: 0.5 10*3/uL (ref 0.1–1.0)
Monocytes Relative: 8 %
Neutro Abs: 2.9 10*3/uL (ref 1.7–7.7)
Neutrophils Relative %: 49 %
Platelet Count: 351 10*3/uL (ref 150–400)
RBC: 4.19 MIL/uL (ref 3.87–5.11)
RDW: 11.4 % — ABNORMAL LOW (ref 11.5–15.5)
WBC Count: 6 10*3/uL (ref 4.0–10.5)
nRBC: 0 % (ref 0.0–0.2)

## 2020-08-03 LAB — CMP (CANCER CENTER ONLY)
ALT: 58 U/L — ABNORMAL HIGH (ref 0–44)
AST: 47 U/L — ABNORMAL HIGH (ref 15–41)
Albumin: 3.8 g/dL (ref 3.5–5.0)
Alkaline Phosphatase: 61 U/L (ref 38–126)
Anion gap: 6 (ref 5–15)
BUN: 14 mg/dL (ref 6–20)
CO2: 26 mmol/L (ref 22–32)
Calcium: 9.1 mg/dL (ref 8.9–10.3)
Chloride: 106 mmol/L (ref 98–111)
Creatinine: 0.91 mg/dL (ref 0.44–1.00)
GFR, Est AFR Am: >60 mL/min (ref >60)
GFR, Estimated: >60 mL/min (ref >60)
Glucose, Bld: 125 mg/dL — ABNORMAL HIGH (ref 70–99)
Potassium: 3.7 mmol/L (ref 3.5–5.1)
Sodium: 138 mmol/L (ref 135–145)
Total Bilirubin: 0.4 mg/dL (ref 0.3–1.2)
Total Protein: 7.2 g/dL (ref 6.5–8.1)

## 2020-08-03 NOTE — Progress Notes (Signed)
[place]  [phone] [phone]     ID: [name] [name] DOB: 1971-03-21  MR#: [id number]  EYC#:[id number]  Patient Care Team: Aretta Nip, MD as PCP - General (Family Medicine) Mauro Kaufmann, RN as Oncology Nurse Navigator Rockwell Germany, RN as Oncology Nurse Navigator Jovita Kussmaul, MD as Consulting Physician (General Surgery) Magrinat, Virgie Dad, MD as Consulting Physician (Oncology) Gery Pray, MD as Consulting Physician (Radiation Oncology) Janyth Pupa, DO as Consulting Physician (Obstetrics and Gynecology) Scot Dock, NP OTHER MD:  CHIEF COMPLAINT: Estrogen receptor positive breast cancer  CURRENT TREATMENT: Awaiting definitive surgery   HISTORY OF CURRENT ILLNESS: [name] [name] herself noted a change in her right breast when exercising.  She thinks she first noticed this sometime in April 2021.  It was like an indentation when she AB duct at the right upper extremity.  Screening mammogram from 04/15/2020 was negative, with breast density category C.  However the problem became more of a palpable nontender lump and after evaluation by her primary care physician she underwent right breast ultrasonography at [facility] on 06/23/2020 showing: 1.6 cm mass in right breast at 8 o'clock; 1.2 cm mass in right breast at 9 o'clock; the masses are 1.5 cm apart; single indeterminate mildly abnormal node in right axilla.  Accordingly on 06/27/2020 she proceeded to biopsy of the right breast areas in question. 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 Both masses showed similar morphology. Prognostic indicators significant for: estrogen receptor, 100% positive with strong staining intensity and progesterone receptor, 90% positive with moderate-weak staining intensity. Proliferation marker Ki67 at 2%. HER2 equivocal by immunohistochemistry (2+), but negative by  fluorescent in situ hybridization with a signals ratio 1.47 and number per cell 2.5.  The patient's subsequent history is as detailed below.   INTERVAL HISTORY: [name] [name] is here today accompanied by her husband for follow-up of her estrogen positive breast cancer. Since her last visit, she underwent right breast lumpectomy with Dr. Marlou Starks on 07/28/2020.  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    REVIEW OF SYSTEMS: [name] [name] is slightly anxious about her follow up and her results and what they all mean.  She notes that she has not required recent pain medication today or yesterday for pain control.  She has had no issues at her lumpectomy site.  [name] [name] is feeling well otherwise.  She denies any new issues such as fever, chills, chest pain, palpitations, cough, shortness of breath, bowel/bladder changes, headaches, vision issues, or any other concerns.  A detailed ROS was otherwise non contributory.    PAST MEDICAL HISTORY: Past Medical History:  Diagnosis Date   Asthma    Cancer ([place])    Family history of breast cancer 07/08/2020   Family history of ovarian cancer 07/08/2020   Genital herpes     PAST SURGICAL HISTORY: Past Surgical History:  Procedure Laterality Date   BREAST LUMPECTOMY WITH RADIOACTIVE SEED AND SENTINEL LYMPH NODE BIOPSY Right 07/28/2020   Procedure: RIGHT BREAST LUMPECTOMY X 3  WITH RADIOACTIVE SEED AND SENTINEL LYMPH NODE BIOPSY;  Surgeon: Jovita Kussmaul, MD;  Location: [place];  Service: General;  Laterality: Right;  PEC BLOCK   FOOT SURGERY Right    MOUTH SURGERY Right     FAMILY HISTORY: Family History  Problem Relation Age of Onset   CVA Other  Ovarian cancer Sister 35   Breast cancer Other        maternal grandmother's sister, dx 46s, d. 70s   Her father is currently 25, and her mother is 63 as of 06/2020. [name] [name] has 3 brothers and 4 sisters. She  reports ovarian cancer in her sister at age 59 (sister is doing well now) and breast cancer in a maternal great-aunt (post-menopausal).   GYNECOLOGIC HISTORY:  Patient's last menstrual period was 07/09/2020. Menarche: 49 years old Age at first live birth: 49 years old [place] P 1 LMP 7/5 - 05/28/2020, periods are irregular Contraceptive: never used HRT n/a  Hysterectomy? no BSO? 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 Husband [name] [name] "[name] [name]" is a Airline pilot. She lives at home with [name] [name]. Daughter [name] [name], age 69, is a Education administrator here in [place]. [name] [name] attends [employer].    ADVANCED DIRECTIVES: In the absence of any documentation to the contrary, the patient's spouse is their HCPOA.    HEALTH MAINTENANCE: Social History   Tobacco Use   Smoking status: Never Smoker   Smokeless tobacco: Never Used  Vaping Use   Vaping Use: Never used  Substance Use Topics   Alcohol use: Yes    Comment: social   Drug use: No     Colonoscopy: n/a (age)  PAP: 01/2020  Bone density: n/a (age)   Allergies  Allergen Reactions   Shellfish Allergy     Throat and ear itching     Current Outpatient Medications  Medication Sig Dispense Refill   BLACK CURRANT SEED OIL PO Take 1 Dose by mouth every Wednesday. Liquid      cholecalciferol (VITAMIN D3) 25 MCG (1000 UNIT) tablet Take 2,000 Units by mouth every Tuesday.      Coenzyme Q10 (COQ-10) 100 MG CAPS Take 100 mg by mouth every Friday.      HYDROcodone-acetaminophen (NORCO/VICODIN) 5-325 MG tablet Take 1-2 tablets by mouth every 6 (six) hours as needed for moderate pain or severe pain. 20 tablet 0   ibuprofen (ADVIL) 200 MG tablet Take 200-400 mg by mouth every 6 (six) hours as needed for headache or moderate pain.      levonorgestrel-ethinyl estradiol (SEASONALE) 0.15-0.03 MG tablet Take 1 tablet by mouth daily.      Omega 3-6-9 Fatty Acids (OMEGA 3-6-9  COMPLEX PO) Take 1 capsule by mouth every Sunday.      OVER THE COUNTER MEDICATION Take 2 capsules by mouth daily. [facility] Choice Healthy Hair otc supplement     Prenatal Vit-Fe Fumarate-FA (PRENATAL MULTIVITAMIN) TABS tablet Take 1 tablet by mouth every Monday.      Tetrahydrozoline HCl (VISINE OP) Place 1 drop into both eyes daily as needed (redness).      vitamin E 180 MG (400 UNITS) capsule Take 400 Units by mouth every Thursday.      No current facility-administered medications for this visit.    OBJECTIVE: African-American woman who appears younger than stated age  75:   08/03/20 1552  BP: 126/82  Pulse: 83  Resp: 18  Temp: 97.7 F (36.5 C)  SpO2: 100%     Body mass index is 27.29 kg/m.   Wt Readings from Last 3 Encounters:  08/03/20 149 lb 3.2 oz (67.7 kg)  07/28/20 147 lb 4.8 oz (66.8 kg)  07/21/20 147 lb 4.8 oz (66.8 kg)      ECOG FS:1 - Symptomatic but completely ambulatory  GENERAL: Patient is  a well appearing female in no acute distress HEENT:  Sclerae anicteric.  Mask in place.   Neck is supple.  NODES:  No cervical, supraclavicular, or axillary lymphadenopathy palpated.  BREAST EXAM:  Deferred left breast exam, inspected right breast only.  Lumpectomy site and axillary surgical sites are healing well.  There is slight swelling which is expected at this time, no erythema, no warmth, no sign of infection. LUNGS:  Clear to auscultation bilaterally.  No wheezes or rhonchi. HEART:  Regular rate and rhythm. No murmur appreciated. ABDOMEN:  Soft, nontender.  Positive, normoactive bowel sounds. No organomegaly palpated. MSK:  No focal spinal tenderness to palpation. Full range of motion bilaterally in the upper extremities. EXTREMITIES:  No peripheral edema.   SKIN:  Clear with no obvious rashes or skin changes. No nail dyscrasia. NEURO:  Nonfocal. Well oriented.  Appropriate affect.     LAB RESULTS:  CMP     Component Value Date/Time   NA 138  08/03/2020 1536   K 3.7 08/03/2020 1536   CL 106 08/03/2020 1536   CO2 26 08/03/2020 1536   GLUCOSE 125 (H) 08/03/2020 1536   BUN 14 08/03/2020 1536   CREATININE 0.91 08/03/2020 1536   CALCIUM 9.1 08/03/2020 1536   PROT 7.2 08/03/2020 1536   ALBUMIN 3.8 08/03/2020 1536   AST 47 (H) 08/03/2020 1536   ALT 58 (H) 08/03/2020 1536   ALKPHOS 61 08/03/2020 1536   BILITOT 0.4 08/03/2020 1536   GFRNONAA >60 08/03/2020 1536   GFRAA >60 08/03/2020 1536    No results found for: TOTALPROTELP, ALBUMINELP, A1GS, A2GS, BETS, BETA2SER, GAMS, MSPIKE, SPEI  Lab Results  Component Value Date   WBC 6.0 08/03/2020   NEUTROABS 2.9 08/03/2020   HGB 13.1 08/03/2020   HCT 37.6 08/03/2020   MCV 89.7 08/03/2020   PLT 351 08/03/2020    No results found for: LABCA2  No components found for: MVHQIO962  No results for input(s): INR in the last 168 hours.  No results found for: LABCA2  No results found for: XBM841  No results found for: LKG401  No results found for: UUV253  No results found for: CA2729  No components found for: HGQUANT  No results found for: CEA1 / No results found for: CEA1   No results found for: AFPTUMOR  No results found for: CHROMOGRNA  No results found for: KPAFRELGTCHN, LAMBDASER, KAPLAMBRATIO (kappa/lambda light chains)  No results found for: HGBA, HGBA2QUANT, HGBFQUANT, HGBSQUAN (Hemoglobinopathy evaluation)   No results found for: LDH  No results found for: IRON, TIBC, IRONPCTSAT (Iron and TIBC)  No results found for: FERRITIN  Urinalysis No results found for: COLORURINE, APPEARANCEUR, LABSPEC, PHURINE, GLUCOSEU, HGBUR, BILIRUBINUR, KETONESUR, PROTEINUR, UROBILINOGEN, NITRITE, LEUKOCYTESUR   STUDIES:    ELIGIBLE FOR AVAILABLE RESEARCH PROTOCOL: AET  ASSESSMENT: 49 y.o. [place] woman status post right breast biopsy x2 on 06/27/2020 for an mT1c N0, stage IA invasive ductal carcinoma, grade 1 or 2, estrogen and progesterone receptor positive,  HER-2 not amplified, with an MIB-1 of 2%.  (a) and equivocal right axillary lymph node was biopsied 06/27/2020 and was benign  (1) genetics testing recommended on 07/07/2020 and declined  (2)  Right Breast Lumpectomy 07/28/2020: invasive ductal carcinoma, 1.9 cm, grade 2, margins negative, 4 SLN negative  (3) Oncotype to be obtained from the definitive surgical sample: Chemotherapy not anticipated, sent by nurse navigator on 08/02/2020  (4) adjuvant radiation  (5) antiestrogens: Consider goserelin/anastrozole  PLAN: [name] [name] is here for follow up of  her recent surgery.  She tolerated surgery quite well and is healing very well.  She is understandably anxious today about her pathology results and what they mean.  Her husband joined her in the visit and was a very helpful participant.    I reviewed [facility] pathology results.  I confirmed that she has stage IA breast cancer.    We discussed next steps: oncotype testing. I reviewed that the oncotype testing will reveal whether or not she should receive chemotherapy.  Due to the grade and the proliferation rate of the tumor, we do not anticipate her needing chemotherapy, however need the oncotype confirmation to be certain.  This should return in about 2 weeks.  If she does need chemotherapy, then we will meet with her again to discuss this in detail.  We discussed that she will need adjuvant radiation therapy and that takes 4-6 weeks depending on her tumor size, location, and the planning by radiation oncology.    We discussed anti estrogen therapy briefly.  She asked me about birth control options, because she was taking an oral contraceptive pill containing estrogen.  I counseled her to stay off of any oral contraceptives.  We briefly discussed Zoladex, versus getting an IUD with her gynecologist.  I recommended she talk to her gynecologist about this further.  She plans on doing this.    I recommended that during this time period Ty[place]  focus on the things within her control to reduce her risk of breast cancer recurrence.  These include healthy diet and exercise as she is cleared by Dr. Marlou Starks.  She understand this.    Total encounter time 70 minutes.[name] Bihari, NP 08/03/20 4:07 PM Medical Oncology and Hematology [facility] [place], [place] [address] Tel. [phone]    Fax. [phone]   ADDENDUM: [name] [name] did very well with her surgery and she has a stage IA disease.  We had a long discussion regarding the need of systemic therapy and she understands of the Oncotype test will tell us whether her tumor had "legs" and therefore is more likely to have spread, in which case she will need chemotherapy, or did not have legs and therefore she can proceed directly to radiation treatments and rely entirely on antiestrogen pills for systemic treatment.  We should have those answers in about 2 weeks.  If she does require chemo she will return to see me and we will discuss her options.    I reassured her that her prognosis was good and the only question was how hard she would need to work to get those good results.  I personally saw this patient and performed a substantive portion of this encounter with the listed APP documented above.   Chauncey Cruel, MD Medical Oncology and Hematology [facility] [address] [place], [place] [address] Tel. [phone]    Fax. [phone]      *Total Encounter Time as defined by the Centers for Medicare and Medicaid Services includes, in addition to the face-to-face time of a patient visit (documented in the note above) non-face-to-face time: obtaining and reviewing outside history, ordering and reviewing medications, tests or procedures, care coordination (communications with other health care professionals or caregivers) and documentation in the medical record.

## 2020-08-03 NOTE — Progress Notes (Signed)
[place] Spiritual Care Note  Followed up with [facility] by phone. She is working hard to process her diagnosis and to maintain an upbeat, empowered approach to healing. Maintaining a healthy diet and exercise regimen are important part of that for her. She is alert to divine promptings as she moves forward with self-care and meaning-making. We plan to follow up by phone for another pastoral check-in next week.   [place], [place], [facility] Pager [phone] Voicemail [phone]

## 2020-08-04 ENCOUNTER — Telehealth: Payer: Self-pay

## 2020-08-04 ENCOUNTER — Encounter: Payer: Self-pay | Admitting: Adult Health

## 2020-08-04 NOTE — Telephone Encounter (Signed)
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 Patient advised the schedulers should call in the next week with her consult date with Dr. Sondra Come but we are unable to give her the dates for treatment yet. Patient was given my name and contact information and advised she can call me next week but the [facility] will be in touch as soon as they know her consult date.

## 2020-08-05 ENCOUNTER — Telehealth: Payer: Self-pay | Admitting: Adult Health

## 2020-08-05 NOTE — Telephone Encounter (Signed)
No 9/15 los. No changes made to pt's schedule.

## 2020-08-08 ENCOUNTER — Telehealth: Payer: Self-pay

## 2020-08-08 NOTE — Telephone Encounter (Signed)
Scheduled counseling intake for 9/21 at 11 am.

## 2020-08-09 ENCOUNTER — Telehealth: Payer: Self-pay

## 2020-08-09 ENCOUNTER — Telehealth: Payer: Self-pay | Admitting: Genetic Counselor

## 2020-08-09 NOTE — Telephone Encounter (Signed)
Ms. Selinger called to reschedule her counseling appointment with [name] that is scheduled for today at [facility] and Lorrin Jackson have been notified.

## 2020-08-09 NOTE — Telephone Encounter (Signed)
Having trouble with work, wants to call and reschedule sometime next week.

## 2020-08-10 ENCOUNTER — Telehealth: Payer: Self-pay

## 2020-08-10 ENCOUNTER — Telehealth: Payer: Self-pay | Admitting: *Deleted

## 2020-08-10 NOTE — Telephone Encounter (Signed)
Nutrition  RD received voicemail from patient regarding nutrition questions.  Called patient back this am and started answering questions.  Patient said that she was expecting a call from MD office and would call RD back.    [name] [name] B. Zenia Resides, [place], [place] Registered Dietitian [phone] (mobile)

## 2020-08-10 NOTE — Telephone Encounter (Signed)
Called and spoke with patient to let her know that we are still waiting on the oncotype results before proceeding with next steps.  According to exact sciences the estimated result date is 9/30. Informed her we would call her as soon as we had results. Patient verbalized understanding.

## 2020-08-10 NOTE — Telephone Encounter (Signed)
Patient called and advised her that Her Oncotype was pending and her radiation cannot be scheduled until it is known if she will need chemotherapy. Patient also advised she needs to be at least 4 weeks out from her surgery which was 07/28/20. 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 Patient advised that the earliest tentative date that she might be scheduled would be 08/29/2020. Patient verbalized understanding that the actual dates are unknown at this point.The schedulers for radiation will call her once the order is given to bring her in to see Dr. Sondra Come.

## 2020-08-12 ENCOUNTER — Encounter: Payer: Self-pay | Admitting: *Deleted

## 2020-08-12 ENCOUNTER — Encounter: Payer: Self-pay | Admitting: General Practice

## 2020-08-12 ENCOUNTER — Telehealth: Payer: Self-pay | Admitting: *Deleted

## 2020-08-12 NOTE — Telephone Encounter (Signed)
Received oncotype results of 28/17%.  Patient is aware.  She is scheduled to see Dr. Jana Hakim 9/27 at 830am to discuss and patient is aware of the appointment.

## 2020-08-12 NOTE — Progress Notes (Signed)
[place] Spiritual Care Note  Followed up with [facility] by phone. A particular challenge is awaiting oncotype results so that her treatment plan will become clear. A recent joy was getting to see the Steelers play, an annual tradition for her and her husband. [name] [name] is very Gaffer check-ins and continuing to practice a positive attitude. We plan to follow up in November (because I will be on leave in October), and she knows to contact the interim chaplains if needed in the meantime. She also plans to meet with the counseling intern, [name] [name], for ongoing support.   [place], [place], [facility] Pager [phone] Voicemail [phone]

## 2020-08-14 NOTE — Progress Notes (Signed)
[place]  [phone]) [phone] [phone]) [phone]     ID: [name] [name] DOB: [birth year]  MR#: [id number]  SFK#:[id number]  Patient Care Team: Aretta Nip, MD as PCP - General (Family Medicine) Mauro Kaufmann, RN as Oncology Nurse Navigator Rockwell Germany, RN as Oncology Nurse Navigator Jovita Kussmaul, MD as Consulting Physician (General Surgery) Nation Cradle, Virgie Dad, MD as Consulting Physician (Oncology) Gery Pray, MD as Consulting Physician (Radiation Oncology) Janyth Pupa, DO as Consulting Physician (Obstetrics and Gynecology) Chauncey Cruel, MD OTHER MD:  CHIEF COMPLAINT: Estrogen receptor positive breast cancer  CURRENT TREATMENT: Considering adjuvant chemotherapy   INTERVAL HISTORY: [name] [name] returns today for follow-up of her estrogen positive breast cancer accompanied by her husband [name] [name].   Since her last visit, Oncotype DX was obtained on the final surgical sample and the recurrence score of 28 predicts a risk of recurrence outside the breast over the next 9 years of 17%, if the patient's only systemic therapy is an antiestrogen for 5 years.  It also predicts a significant benefit from chemotherapy.  She is here to discuss these results.   REVIEW OF SYSTEMS: [name] [name] has recovered well from her surgery.  She does have a slight "bulge" in the upper outer incision area which is consistent with a seroma.  She has discussed this with Dr. Marlou Starks and has an appointment for follow-up with him in 2 weeks.  She continues to go to the gym regularly and to do stretching exercises.  A detailed review of systems today was otherwise stable.   HISTORY OF CURRENT ILLNESS: From the original intake note:  [name] [name] herself noted a change in her right breast when exercising.  She thinks she first noticed this sometime in April 2021.  It was like an indentation when she AB duct at the right upper extremity.  Screening mammogram from 04/15/2020 was  negative, with breast density category C.  However the problem became more of a palpable nontender lump and after evaluation by her primary care physician she underwent right breast ultrasonography at [facility] on 06/23/2020 showing: 1.6 cm mass in right breast at 8 o'clock; 1.2 cm mass in right breast at 9 o'clock; the masses are 1.5 cm apart; single indeterminate mildly abnormal node in right axilla.  Accordingly on 06/27/2020 she proceeded to biopsy of the right breast areas in question. The pathology from this procedure ([id number]) showed: invasive and in situ mammary carcinoma, e-cadherin positive, grade 1-2. Both masses showed similar morphology. Prognostic indicators significant for: estrogen receptor, 100% positive with strong staining intensity and progesterone receptor, 90% positive with moderate-weak staining intensity. Proliferation marker Ki67 at 2%. HER2 equivocal by immunohistochemistry (2+), but negative by fluorescent in situ hybridization with a signals ratio 1.47 and number per cell 2.5.  The patient's subsequent history is as detailed below.    PAST MEDICAL HISTORY: Past Medical History:  Diagnosis Date  . Asthma   . Cancer ([place])   . Family history of breast cancer 07/08/2020  . Family history of ovarian cancer 07/08/2020  . Genital herpes     PAST SURGICAL HISTORY: Past Surgical History:  Procedure Laterality Date  . BREAST LUMPECTOMY WITH RADIOACTIVE SEED AND SENTINEL LYMPH NODE BIOPSY Right 07/28/2020   Procedure: RIGHT BREAST LUMPECTOMY X 3  WITH RADIOACTIVE SEED AND SENTINEL LYMPH NODE BIOPSY;  Surgeon: Jovita Kussmaul, MD;  Location: [place];  Service: General;  Laterality: Right;  PEC BLOCK  . FOOT SURGERY Right   . MOUTH SURGERY Right  FAMILY HISTORY: Family History  Problem Relation Age of Onset  . CVA Other   . Ovarian cancer Sister 73  . Breast cancer Other        maternal grandmother's sister, dx 7s, d. 50s   Her father is currently 15, and her mother is 53 as  of 06/2020. [name] [name] has 3 brothers and 4 sisters. She reports ovarian cancer in her sister at age 96 (sister is doing well now) and breast cancer in a maternal great-aunt (post-menopausal).   GYNECOLOGIC HISTORY:  No LMP recorded. Menarche: 49 years old Age at first live birth: 49 years old [place] P 1 LMP 7/5 - 05/28/2020, periods are irregular Contraceptive: never used HRT n/a  Hysterectomy? no BSO? 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 Husband [name] [name] "[name] [name]" is a Airline pilot. She lives at home with [name] [name]. Daughter [name] [name], age 52, is a Education administrator here in [place]. [name] [name] attends [employer].    ADVANCED DIRECTIVES: In the absence of any documentation to the contrary, the patient's spouse is their HCPOA.    HEALTH MAINTENANCE: Social History   Tobacco Use  . Smoking status: Never Smoker  . Smokeless tobacco: Never Used  Vaping Use  . Vaping Use: Never used  Substance Use Topics  . Alcohol use: Yes    Comment: social  . Drug use: No     Colonoscopy: n/a (age)  PAP: 01/2020  Bone density: n/a (age)   Allergies  Allergen Reactions  . Shellfish Allergy     Throat and ear itching     Current Outpatient Medications  Medication Sig Dispense Refill  . BLACK CURRANT SEED OIL PO Take 1 Dose by mouth every Wednesday. Liquid     . cholecalciferol (VITAMIN D3) 25 MCG (1000 UNIT) tablet Take 2,000 Units by mouth every Tuesday.     . Coenzyme Q10 (COQ-10) 100 MG CAPS Take 100 mg by mouth every Friday.     Marland Kitchen HYDROcodone-acetaminophen (NORCO/VICODIN) 5-325 MG tablet Take 1-2 tablets by mouth every 6 (six) hours as needed for moderate pain or severe pain. 20 tablet 0  . ibuprofen (ADVIL) 200 MG tablet Take 200-400 mg by mouth every 6 (six) hours as needed for headache or moderate pain.     Marland Kitchen levonorgestrel-ethinyl estradiol (SEASONALE) 0.15-0.03 MG tablet Take 1 tablet by mouth daily.     Ernestine Conrad 3-6-9 Fatty  Acids (OMEGA 3-6-9 COMPLEX PO) Take 1 capsule by mouth every Sunday.     Marland Kitchen OVER THE COUNTER MEDICATION Take 2 capsules by mouth daily. [facility] Choice Healthy Hair otc supplement    . Prenatal Vit-Fe Fumarate-FA (PRENATAL MULTIVITAMIN) TABS tablet Take 1 tablet by mouth every Monday.     . Tetrahydrozoline HCl (VISINE OP) Place 1 drop into both eyes daily as needed (redness).     . vitamin E 180 MG (400 UNITS) capsule Take 400 Units by mouth every Thursday.      No current facility-administered medications for this visit.    OBJECTIVE: African-American woman who appears younger than stated age  37:   08/15/20 0853  BP: 103/79  Pulse: 74  Resp: 18  Temp: 97.8 F (36.6 C)  SpO2: 100%     Body mass index is 26.94 kg/m.   Wt Readings from Last 3 Encounters:  08/15/20 147 lb 4.8 oz (66.8 kg)  08/03/20 149 lb 3.2 oz (67.7 kg)  07/28/20 147 lb 4.8 oz (66.8 kg)  ECOG FS:1 - Symptomatic but completely ambulatory  Sclerae unicteric, EOMs intact Wearing a mask No cervical or supraclavicular adenopathy Lungs no rales or rhonchi Heart regular rate and rhythm Abd soft, nontender, positive bowel sounds MSK no focal spinal tenderness, no upper extremity lymphedema Neuro: nonfocal, well oriented, appropriate affect Breasts: The right breast is status post lumpectomy.  The incisions are healing nicely without erythema or dehiscence.  In the right axilla associated with the incision there is a palpable area consistent with a seroma measuring approximately 3 cm.  There is no overlying erythema.  The left breast and left axilla are benign.   LAB RESULTS:  CMP     Component Value Date/Time   NA 138 08/03/2020 1536   K 3.7 08/03/2020 1536   CL 106 08/03/2020 1536   CO2 26 08/03/2020 1536   GLUCOSE 125 (H) 08/03/2020 1536   BUN 14 08/03/2020 1536   CREATININE 0.91 08/03/2020 1536   CALCIUM 9.1 08/03/2020 1536   PROT 7.2 08/03/2020 1536   ALBUMIN 3.8 08/03/2020 1536   AST 47 (H)  08/03/2020 1536   ALT 58 (H) 08/03/2020 1536   ALKPHOS 61 08/03/2020 1536   BILITOT 0.4 08/03/2020 1536   GFRNONAA >60 08/03/2020 1536   GFRAA >60 08/03/2020 1536    No results found for: TOTALPROTELP, ALBUMINELP, A1GS, A2GS, BETS, BETA2SER, GAMS, MSPIKE, SPEI  Lab Results  Component Value Date   WBC 6.0 08/03/2020   NEUTROABS 2.9 08/03/2020   HGB 13.1 08/03/2020   HCT 37.6 08/03/2020   MCV 89.7 08/03/2020   PLT 351 08/03/2020    No results found for: LABCA2  No components found for: OIBBCW888  No results for input(s): INR in the last 168 hours.  No results found for: LABCA2  No results found for: BVQ945  No results found for: WTU882  No results found for: CMK349  No results found for: CA2729  No components found for: HGQUANT  No results found for: CEA1 / No results found for: CEA1   No results found for: AFPTUMOR  No results found for: CHROMOGRNA  No results found for: KPAFRELGTCHN, LAMBDASER, KAPLAMBRATIO (kappa/lambda light chains)  No results found for: HGBA, HGBA2QUANT, HGBFQUANT, HGBSQUAN (Hemoglobinopathy evaluation)   No results found for: LDH  No results found for: IRON, TIBC, IRONPCTSAT (Iron and TIBC)  No results found for: FERRITIN  Urinalysis No results found for: COLORURINE, APPEARANCEUR, LABSPEC, PHURINE, GLUCOSEU, HGBUR, BILIRUBINUR, KETONESUR, PROTEINUR, UROBILINOGEN, NITRITE, LEUKOCYTESUR   STUDIES: CT Chest W Contrast  Result Date: 07/19/2020 CLINICAL DATA:  New diagnosis of breast cancer.  Staging. EXAM: CT CHEST WITH CONTRAST TECHNIQUE: Multidetector CT imaging of the chest was performed during intravenous contrast administration. CONTRAST:  84m OMNIPAQUE IOHEXOL 300 MG/ML  SOLN COMPARISON:  None. FINDINGS: Cardiovascular: The heart size is normal. No substantial pericardial effusion. No thoracic aortic aneurysm. Mediastinum/Nodes: No mediastinal lymphadenopathy. There is no hilar lymphadenopathy. The esophagus has normal imaging  features. Right axillary lymph nodes measure up to 9 mm short axis, borderline enlarged. There is no left axillary lymphadenopathy. No internal mammary lymphadenopathy. Lungs/Pleura: No suspicious pulmonary nodule or mass. No focal airspace consolidation. No pleural effusion. Upper Abdomen: Unremarkable. Musculoskeletal: 11 mm soft tissue nodule noted inferior right breast with localization marker, likely site of primary disease. IMPRESSION: 1. 11 mm soft tissue nodule inferior right breast with localization marker, likely site of primary disease. Borderline enlarged right axillary lymph nodes, indeterminate. 2. No other evidence for metastatic disease in the thorax. Electronically Signed  By: Misty Stanley M.D.   On: 07/19/2020 05:33   NM Sentinel Node Inj-No Rpt (Breast)  Result Date: 07/28/2020 Sulfur colloid was injected by the nuclear medicine technologist for melanoma sentinel node.   MM CLIP PLACEMENT RIGHT  Result Date: 07/22/2020 CLINICAL DATA:  Status post MR guided core biopsy of enhancing mass in the posterior LOWER OUTER QUADRANT of the RIGHT breast. EXAM: DIAGNOSTIC RIGHT MAMMOGRAM POST MRI BIOPSY COMPARISON:  Previous exam(s). FINDINGS: Mammographic images were obtained following MRI guided biopsy of enhancing mass in the posterior LOWER OUTER QUADRANT RIGHT breast and placement of a cylinder-shaped clip. Additional samples were obtained and a barbell clip was then placed. The cylinder-shaped clip is not present and was likely removed at the time of additional sampling. The barbell shaped clip marks the site of the biopsy. IMPRESSION: Appropriate positioning of the barbell shaped biopsy marking clip at the site of biopsy in the posterior LOWER OUTER QUADRANT RIGHT breast. Final Assessment: Post Procedure Mammograms for Marker Placement Electronically Signed   By: Nolon Nations M.D.   On: 07/22/2020 10:16   MR RT BREAST BX W LOC DEV 1ST LESION IMAGE BX SPEC MR GUIDE  Addendum Date:  08/01/2020   ADDENDUM REPORT: 07/26/2020 14:14 ADDENDUM: Pathology revealed DUCTAL CARCINOMA IN SITU, ATYPICAL LOBULAR HYPERPLASIA of the RIGHT breast, posterior lower outer quadrant, cylinder and barbell clips. This was found to be concordant by Dr. Nolon Nations. Pathology results were discussed with the patient by telephone. The patient reported doing well after the biopsy with tenderness at the site. Post biopsy instructions and care were reviewed and questions were answered. The patient was encouraged to call [facility] for any additional concerns. The patient has a recent diagnosis of RIGHT breast cancer and should follow her outlined treatment plan. Pathology results reported by Stacie Acres RN on 07/26/2020. Electronically Signed   By: Nolon Nations M.D.   On: 07/26/2020 14:14   Result Date: 08/01/2020 CLINICAL DATA:  Patient presents for MR guided core biopsy of mass in the RIGHT breast. EXAM: MRI GUIDED CORE NEEDLE BIOPSY OF THE RIGHT BREAST TECHNIQUE: Multiplanar, multisequence MR imaging of the RIGHT breast was performed both before and after administration of intravenous contrast. CONTRAST:  103m GADAVIST GADOBUTROL 1 MMOL/ML IV SOLN COMPARISON:  Previous exams. FINDINGS: I met with the patient, and we discussed the procedure of MRI guided biopsy, including risks, benefits, and alternatives. Specifically, we discussed the risks of infection, bleeding, tissue injury, clip migration, and inadequate sampling. Informed, written consent was given. The usual time out protocol was performed immediately prior to the procedure. Using sterile technique, 1% Lidocaine, MRI guidance, and a 9 gauge vacuum assisted device, biopsy was performed of enhancing mass in the posterior LOWER OUTER QUADRANT of the RIGHT breast using a LATERAL to MEDIAL approach. At the conclusion of the procedure, a cylinder shaped tissue marker clip was deployed into the biopsy cavity. On post clip images, the  lesion was still visible. The needle was repositioned and additional samples were obtained. A barbell clip was then placed at the biopsy site. On post clip images, dictated separately, the cylinder-shaped clip is not present and was likely removed at the time of additional sampling. The barbell clip marks the biopsy site. Follow-up 2-view mammogram was performed and dictated separately. IMPRESSION: MRI guided biopsy of enhancing mass in the posterior LOWER OUTER QUADRANT RIGHT breast, marked with a barbell shaped clip no apparent complications. Electronically Signed: By: EEverardo BealsD.  On: 07/22/2020 10:14      ELIGIBLE FOR AVAILABLE RESEARCH PROTOCOL: AET  ASSESSMENT: 49 y.o. [place] woman status post right breast biopsy x2 on 06/27/2020 for an mT1c N0, stage IA invasive ductal carcinoma, grade 1 or 2, estrogen and progesterone receptor positive, HER-2 not amplified, with an MIB-1 of 2%.  (a) and equivocal right axillary lymph node was biopsied 06/27/2020 and was benign  (1) genetics testing recommended on 07/07/2020 and declined  (2)  S/p right lumpectomy 07/28/2020 for an  mpT1c pN0, stage IA invasive ductal carcinoma, grade 2, with negative margins   (a) a total of 4 right axillary lymph nodes removed, all clear  (3) Oncotype score of 28 predicts a risk of this breast cancer recurring outside the breast within the next 9 years of 17% if the patient's only systemic therapy is antiestrogens for 5 years.  It also predicts a significant benefit from chemotherapy.  (4) adjuvant chemotherapy consisting of docetaxel and cyclophosphamide given every 21 days x 4 recommended  (5) adjuvant radiation to follow  (6) antiestrogens to start of the completion of local treatment  (a) consider goserelin/anastrozole   PLAN: [name] [name] has a small node-negative estrogen receptor positive stage I breast cancer which however is dangerous.  The Oncotype score of 28 indicates that without chemotherapy  we can expect a risk of recurrence outside the breast of 17%.  This means that of 100 women exactly like her 68 would be expected to develop incurable breast cancer.  The Oncotype also tells [place] that she will have a significant benefit from chemotherapy.  I have quoted her a 12% benefit meaning that now if the women in her group all received chemo, instead of 17 of them having incurable breast cancer in the future only if I would have a incurable breast cancer in the future.  We discussed the possible toxicity side effects and complications of the chemotherapy that we proposed which is docetaxel and cyclophosphamide given every 21 days x 4.  We discussed the possible use of a Digna cap.  We discussed the concern regarding peripheral neuropathy, the likelihood of permanent menopause, as well as the need for an immune booster shot with these agents.  She will return later this afternoon for a fuller discussion with her chemotherapy teaching nurse.  This is not an easy decision for [name] [name] to make.  She will consider the odds and I hope will opt for standard treatment.  If so we may be able to start as early as 08/30/2020.  She will see me again in 1 week to discuss any further questions and operationalize a definitive plan.  Total encounter time 40 minutes. Sarajane Jews C. Goldman Birchall, MD 08/15/20 9:37 AM Medical Oncology and Hematology [facility] [place], [place] [address] Tel. [phone]    Fax. [phone]   I, Wilburn Mylar, am acting as scribe for Dr. Virgie Dad. Alekxander Isola.  I, Lurline Del MD, have reviewed the above documentation for accuracy and completeness, and I agree with the above.    *Total Encounter Time as defined by the Centers for Medicare and Medicaid Services includes, in addition to the face-to-face time of a patient visit (documented in the note above) non-face-to-face time: obtaining and reviewing outside history, ordering and reviewing  medications, tests or procedures, care coordination (communications with other health care professionals or caregivers) and documentation in the medical record.

## 2020-08-15 ENCOUNTER — Encounter: Payer: Self-pay | Admitting: *Deleted

## 2020-08-15 ENCOUNTER — Inpatient Hospital Stay: Payer: 59

## 2020-08-15 ENCOUNTER — Telehealth: Payer: Self-pay | Admitting: Oncology

## 2020-08-15 ENCOUNTER — Other Ambulatory Visit: Payer: Self-pay

## 2020-08-15 ENCOUNTER — Inpatient Hospital Stay (HOSPITAL_BASED_OUTPATIENT_CLINIC_OR_DEPARTMENT_OTHER): Payer: 59 | Admitting: Oncology

## 2020-08-15 VITALS — BP 103/79 | HR 74 | Temp 97.8°F | Resp 18 | Ht 62.0 in | Wt 147.3 lb

## 2020-08-15 DIAGNOSIS — Z170 Estrogen receptor positive status [ER+]: Secondary | ICD-10-CM | POA: Diagnosis not present

## 2020-08-15 DIAGNOSIS — C50811 Malignant neoplasm of overlapping sites of right female breast: Secondary | ICD-10-CM | POA: Diagnosis not present

## 2020-08-15 NOTE — Progress Notes (Signed)
START ON PATHWAY REGIMEN - Breast     A cycle is every 21 days:     Docetaxel      Cyclophosphamide   **Always confirm dose/schedule in your pharmacy ordering system**  Patient Characteristics: Postoperative without Neoadjuvant Therapy (Pathologic Staging), Invasive Disease, Adjuvant Therapy, HER2 Negative/Unknown/Equivocal, ER Positive, Node Negative, pT1a-c, pN0/N43m or pT2 or Higher, pN0, Oncotype High Risk (? 26) Therapeutic Status: Postoperative without Neoadjuvant Therapy (Pathologic Staging) AJCC Grade: G2 AJCC N Category: pN0 AJCC M Category: cM0 ER Status: Positive (+) AJCC 8 Stage Grouping: IA HER2 Status: Negative (-) Oncotype Dx Recurrence Score: 28 AJCC T Category: pT1c PR Status: Positive (+) Has this patient completed genomic testing<= Yes - Oncotype DX(R) Intent of Therapy: Curative Intent, Discussed with Patient

## 2020-08-15 NOTE — Telephone Encounter (Signed)
Scheduled appts per 9/27 los. Gave pt a print out of AVS.

## 2020-08-16 ENCOUNTER — Encounter: Payer: Self-pay | Admitting: Oncology

## 2020-08-16 ENCOUNTER — Telehealth: Payer: Self-pay

## 2020-08-16 NOTE — Telephone Encounter (Signed)
Called to reschedule, but patient needs more time to figure things out. Will call again next week.

## 2020-08-17 ENCOUNTER — Telehealth: Payer: Self-pay | Admitting: *Deleted

## 2020-08-17 NOTE — Telephone Encounter (Signed)
Received call from Dr. Luan Pulling stating patient had contacted her office wanting a second opinion for med onc regarding chemo. I called [facility] and their breast nurse navigator will give her a call to schedule an appointment.    I attempted to call patient but could not leave a message because her voicemail was full. Will try again.

## 2020-08-18 ENCOUNTER — Ambulatory Visit: Payer: 59 | Attending: General Surgery | Admitting: Physical Therapy

## 2020-08-18 ENCOUNTER — Telehealth: Payer: Self-pay | Admitting: Oncology

## 2020-08-18 ENCOUNTER — Encounter: Payer: Self-pay | Admitting: Physical Therapy

## 2020-08-18 ENCOUNTER — Other Ambulatory Visit: Payer: Self-pay

## 2020-08-18 DIAGNOSIS — C50511 Malignant neoplasm of lower-outer quadrant of right female breast: Secondary | ICD-10-CM | POA: Diagnosis not present

## 2020-08-18 DIAGNOSIS — M79601 Pain in right arm: Secondary | ICD-10-CM

## 2020-08-18 DIAGNOSIS — Z483 Aftercare following surgery for neoplasm: Secondary | ICD-10-CM | POA: Diagnosis present

## 2020-08-18 DIAGNOSIS — Z170 Estrogen receptor positive status [ER+]: Secondary | ICD-10-CM

## 2020-08-18 NOTE — Patient Instructions (Signed)
            [facility]         [address], [place] [address]         [phone]         Annia Friendly, PT, CLT   After Breast Cancer Class It is recommended you attend the ABC class to be educated on lymphedema risk reduction. This class is free of charge and lasts for 1 hour. It is a 1-time class.  You are signed up for October 4th at 11:00. We will send you a link for the class.  Scar massage You can begin scar massage gently after your steri-strips are gone. Use coconut oil or cocoa butter or Vitamin E cream.   Home exercise Program Continue the exercises given pre-operatively and do the new ones given today to regain the last few degrees of range of motion.   Follow up PT: It is recommended you return every 3 months for the first 3 years following surgery to be assessed on the SOZO machine for an L-Dex score. This helps prevent clinically significant lymphedema in 95% of patients. These follow up screens are 15 minute appointments that you are not billed for.  Closed Chain: Shoulder Abduction / Adduction - on Wall    One hand on wall, step to side and return. Stepping causes shoulder to abduct and adduct. Step _5__ times, holding 5 seconds, _2__ times per day.  http://ss.exer.us/267   Copyright  VHI. All rights reserved.  Closed Chain: Shoulder Flexion / Extension - on Wall    Hands on wall, step backward. Return. Stepping causes shoulder flexion and extension Do _5__ times, holding 5 seconds, _2__ times per day.  http://ss.exer.us/265   Copyright  VHI. All rights reserved.

## 2020-08-18 NOTE — Telephone Encounter (Signed)
Released records to [facility] to [place]  Release:  [id number]

## 2020-08-18 NOTE — Therapy (Signed)
[place], [place], [address] Phone: [phone]   Fax:  [phone]  Physical Therapy Treatment  Patient Details  Name: [name] [name] MRN: [id number] Date of Birth: 1971-05-01 Referring Provider (PT): Dr. Autumn Messing   Encounter Date: 08/18/2020   PT End of Session - 08/18/20 1746    Visit Number 2    Number of Visits 2    PT Start Time 1115    PT Stop Time 1240    PT Time Calculation (min) 85 min    Activity Tolerance Patient tolerated treatment well    Behavior During Therapy [facility] for tasks assessed/performed           Past Medical History:  Diagnosis Date  . Asthma   . Cancer ([place])   . Family history of breast cancer 07/08/2020  . Family history of ovarian cancer 07/08/2020  . Genital herpes     Past Surgical History:  Procedure Laterality Date  . BREAST LUMPECTOMY WITH RADIOACTIVE SEED AND SENTINEL LYMPH NODE BIOPSY Right 07/28/2020   Procedure: RIGHT BREAST LUMPECTOMY X 3  WITH RADIOACTIVE SEED AND SENTINEL LYMPH NODE BIOPSY;  Surgeon: Jovita Kussmaul, MD;  Location: [facility];  Service: General;  Laterality: Right;  PEC BLOCK  . FOOT SURGERY Right   . MOUTH SURGERY Right     There were no vitals filed for this visit.   Subjective Assessment - 08/18/20 1118    Subjective Patient reports she underwent a right lumpectomy and sentinel node biopsy (4 negative nodes) on 07/28/2020. Her Oncotype score was 28 so chemo is being recommended. She is unsure if she wants to do chemo if we can not tell her 100% for sure it will not recur. She is seeking a 2nd opinion at [facility].    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 It is ER/PR positive and HER2 negative with a Ki67 of 2%. Patient reports she underwent a right lumpectomy and sentinel node biopsy (4 negative nodes) on 07/28/2020.    Patient Stated Goals See how my arm is doing     Currently in Pain? No/denies              [facility] PT Assessment - 08/18/20 0001      Assessment   Medical Diagnosis s/p right lumpectomy and SLNB    Referring Provider (PT) Dr. Autumn Messing    Onset Date/Surgical Date 07/28/20    Hand Dominance Right    Prior Therapy Baselines      Precautions   Precautions Other (comment)    Precaution Comments recent surgery      Restrictions   Weight Bearing Restrictions No      Balance Screen   Has the patient fallen in the past 6 months No    Has the patient had a decrease in activity level because of a fear of falling?  No    Is the patient reluctant to leave their home because of a fear of falling?  No      Home Environment   Living Environment Private residence    Living Arrangements Spouse/significant other    Available Help at Discharge Family      Prior Function   Level of Independence Independent    Vocation Full time employment    Vocation Requirements [place] She has returned to the gym and is working her core and legs and  is walking on the treadmill for 15 minutes.      Cognition   Overall Cognitive Status Within Functional Limits for tasks assessed      Observation/Other Assessments   Observations Incisions in right breast and axilla both appear to be healing well. Some glue is still present and some mild edema present.      Posture/Postural Control   Posture/Postural Control No significant limitations      ROM / Strength   AROM / PROM / Strength AROM      AROM   AROM Assessment Site Shoulder    Right/Left Shoulder Right    Right Shoulder Extension 64 Degrees    Right Shoulder Flexion 137 Degrees    Right Shoulder ABduction 150 Degrees    Right Shoulder Internal Rotation 72 Degrees    Right Shoulder External Rotation 90 Degrees      Strength   Overall Strength Within functional limits for tasks performed             LYMPHEDEMA/ONCOLOGY QUESTIONNAIRE - 08/18/20 0001      Type    Cancer Type Right breast cancer      Surgeries   Lumpectomy Date 07/28/20    Sentinel Lymph Node Biopsy Date 07/28/20    Number Lymph Nodes Removed 4      Treatment   Active Chemotherapy Treatment No    Past Chemotherapy Treatment No    Active Radiation Treatment No    Past Radiation Treatment No    Current Hormone Treatment No    Past Hormone Therapy No      What other symptoms do you have   Are you Having Heaviness or Tightness No    Are you having Pain No    Are you having pitting edema No    Is it Hard or Difficult finding clothes that fit No    Do you have infections No    Is there Decreased scar mobility No    Stemmer Sign No      Lymphedema Assessments   Lymphedema Assessments Upper extremities      Right Upper Extremity Lymphedema   10 cm Proximal to Olecranon Process 27.2 cm    Olecranon Process 23.4 cm    10 cm Proximal to Ulnar Styloid Process 20.9 cm    Just Proximal to Ulnar Styloid Process 14.3 cm    Across Hand at [employer] 18.9 cm    At [place] of 2nd Digit 5.4 cm      Left Upper Extremity Lymphedema   10 cm Proximal to Olecranon Process 26.2 cm    Olecranon Process 22.8 cm    10 cm Proximal to Ulnar Styloid Process 19.5 cm    Just Proximal to Ulnar Styloid Process 13.9 cm    Across Hand at [employer] 18.4 cm    At [place] of 2nd Digit 5.4 cm              Quick Dash - 08/18/20 0001    Open a tight or new jar No difficulty    Do heavy household chores (wash walls, wash floors) No difficulty    Carry a shopping bag or briefcase Mild difficulty    Wash your back No difficulty    Use a knife to cut food Mild difficulty    Recreational activities in which you take some force or impact through your arm, shoulder, or hand (golf, hammering, tennis) No difficulty    During the past week, to what  extent has your arm, shoulder or hand problem interfered with your normal social activities with family, friends, neighbors, or groups? Not at all     During the past week, to what extent has your arm, shoulder or hand problem limited your work or other regular daily activities Not at all    Arm, shoulder, or hand pain. None    Tingling (pins and needles) in your arm, shoulder, or hand None    Difficulty Sleeping No difficulty    DASH Score 4.55 %                          PT Education - 08/18/20 1744    Education Details Reviewed HEP and education on follow up care    Person(s) Educated Patient    Methods Explanation;Demonstration;Handout    Comprehension Returned demonstration;Verbalized understanding               PT Long Term Goals - 08/18/20 1754      PT LONG TERM GOAL #1   Title Patient will demonstrate she has regained full shoulder ROM and function post operatively compared to baselines.    Time 8    Period Weeks    Status Achieved                 Plan - 08/18/20 1746    Clinical Impression Statement Patient has physically recovered very well from her surgery. She underwent a right lumpectomy and sentinel node biopsy (4 negative nodes) on 07/28/2020. She has regained shoulder ROM and is exercising at her gym. Unfortunately her Oncotype score came back at 28 indicating a high benefit from chemotherapy. She reported doubt and concerns about having chemotherapy knowing that it is not 100% guaranteed not to recur even with chemo and radiation. She has talked with her medical oncologist about this but still has questions and is seeking at [facility]nd [facility] at [facility]. We discussed at length the inability to guarantee no recurrence and what she can do to reduce her risk if she decides to decline chemotherapy such as regular exercise and being compliant taking the anti-estrogen therapy. She was encouraged to contact her oncologist again with her follow up questions because she felt she did not know what to ask when she first heard the news about needing chemotherapy. In regards to PT, she is doing very well. Her  shoulder ROM is back to baseline (with a very mild limitation in abduction), there is no sign of lymphedema, and her incisions appear to be healing very well with no sign of infection. She plans to participate in the ABC class for lymphedema education but otherwise, has no PT needs at this time.    PT Treatment/Interventions ADLs/Self Care Home Management;Therapeutic exercise;Patient/family education    PT Next Visit Plan D/C    PT Home Exercise Plan Post op shoulder ROM HEP and closed chain flexion and abduction    Consulted and Agree with Plan of Care Patient           Patient will benefit from skilled therapeutic intervention in order to improve the following deficits and impairments:  Postural dysfunction, Decreased knowledge of precautions, Impaired UE functional use, Pain, Decreased range of motion  Visit Diagnosis: Malignant neoplasm of lower-outer quadrant of right breast of female, estrogen receptor positive (HCC)  Pain in right arm  Aftercare following surgery for neoplasm     Problem List Patient Active Problem List   Diagnosis Date Noted  .  Family history of ovarian cancer 07/08/2020  . Family history of breast cancer 07/08/2020  . Malignant neoplasm of overlapping sites of right breast in female, estrogen receptor positive ([place]) 07/01/2020  . Murmur 03/09/2014  . Bradycardia 03/09/2014  . Genital herpes    PHYSICAL THERAPY DISCHARGE SUMMARY  Visits from Start of Care: 2  Current functional level related to goals / functional outcomes: Goals met. See above for objective findings.   Remaining deficits: Mild abduction limitation   Education / Equipment: Lymphedema risk reduction and HEP  Plan: Patient agrees to discharge.  Patient goals were met. Patient is being discharged due to meeting the stated rehab goals.  ?????         Annia Friendly, [place] 08/18/20 5:58 PM  [place], [place], [address] Phone: [phone]   Fax:  [phone]  Name: [name] [name] MRN: [id number] Date of Birth: 24-Oct-1971

## 2020-08-19 ENCOUNTER — Telehealth: Payer: Self-pay | Admitting: *Deleted

## 2020-08-19 NOTE — Telephone Encounter (Signed)
Spoke with patient to let her know that someone from [facility] should be giving her a call to schedule her 2nd opinion.  She has many questions and is hesitant regarding chemo. She asked if she could go ahead and start xrt and do chemo after if she decides to proceed with chemo. Informed her that standard of care is to receive chemo 1st then xrt.   Discussed that may be she could start anti-estrogens in the meantime until she makes a decision regarding chemo and she can discuss this further with Dr. Jana Hakim at her appointment Monday 10/4.  She is open to this option.  She is having a hard time understanding all of this and hope she can gain some clarity with speaking with Dr. Jana Hakim again on Monday and also with her 2nd opinion.

## 2020-08-21 NOTE — Progress Notes (Signed)
[facility]  [phone] [phone]     ID: [name] [name] DOB: [birth year]  MR#: [id number]  GHW#:[id number]  Patient Care Team: Aretta Nip, MD as PCP - General (Family Medicine) Mauro Kaufmann, RN as Oncology Nurse Navigator Rockwell Germany, RN as Oncology Nurse Navigator Jovita Kussmaul, MD as Consulting Physician (General Surgery) Parys Elenbaas, Virgie Dad, MD as Consulting Physician (Oncology) Gery Pray, MD as Consulting Physician (Radiation Oncology) Janyth Pupa, DO as Consulting Physician (Obstetrics and Gynecology) Chauncey Cruel, MD OTHER MD:  CHIEF COMPLAINT: Estrogen receptor positive breast cancer  CURRENT TREATMENT: Considering adjuvant chemotherapy; interval tamoxifen   INTERVAL HISTORY: [name] [name] returns today for follow-up of her estrogen positive breast cancer.  Her husband [name] [name] was not able to come with her today.    She is here to discuss further discuss the issue of chemotherapy.   REVIEW OF SYSTEMS: [name] [name] has lost a good deal of trust because her mammogram in May did not show the cancer and because when she had her surgery she was initially told to continue to take her birth control pills.  She also was reassured repeatedly that her cancer appeared to be slow-growing and likely would not benefit from chemotherapy.  Accordingly it came as a shock to receive the Oncotype report and she feels she was not ready to really understand our discussion at the last visit.  Furthermore my handwriting was hard to read and she really could not make out what I had written.  We went over everything again today.   HISTORY OF CURRENT ILLNESS: From the original intake note:  [name] [name] herself noted a change in her right breast when exercising.  She thinks she first noticed this sometime in April 2021.  It was like an indentation when she AB duct at the right upper extremity.  Screening mammogram from 04/15/2020 was negative,  with breast density category C.  However the problem became more of a palpable nontender lump and after evaluation by her primary care physician she underwent right breast ultrasonography at [facility] on 06/23/2020 showing: 1.6 cm mass in right breast at 8 o'clock; 1.2 cm mass in right breast at 9 o'clock; the masses are 1.5 cm apart; single indeterminate mildly abnormal node in right axilla.  Accordingly on 06/27/2020 she proceeded to biopsy of the right breast areas in question. The pathology from this procedure ([id number]) showed: invasive and in situ mammary carcinoma, e-cadherin positive, grade 1-2. Both masses showed similar morphology. Prognostic indicators significant for: estrogen receptor, 100% positive with strong staining intensity and progesterone receptor, 90% positive with moderate-weak staining intensity. Proliferation marker Ki67 at 2%. HER2 equivocal by immunohistochemistry (2+), but negative by fluorescent in situ hybridization with a signals ratio 1.47 and number per cell 2.5.  The patient's subsequent history is as detailed below.    PAST MEDICAL HISTORY: Past Medical History:  Diagnosis Date  . Asthma   . Cancer ([place])   . Family history of breast cancer 07/08/2020  . Family history of ovarian cancer 07/08/2020  . Genital herpes     PAST SURGICAL HISTORY: Past Surgical History:  Procedure Laterality Date  . BREAST LUMPECTOMY WITH RADIOACTIVE SEED AND SENTINEL LYMPH NODE BIOPSY Right 07/28/2020   Procedure: RIGHT BREAST LUMPECTOMY X 3  WITH RADIOACTIVE SEED AND SENTINEL LYMPH NODE BIOPSY;  Surgeon: Jovita Kussmaul, MD;  Location: [facility];  Service: General;  Laterality: Right;  PEC BLOCK  . FOOT SURGERY Right   . MOUTH SURGERY Right  FAMILY HISTORY: Family History  Problem Relation Age of Onset  . CVA Other   . Ovarian cancer Sister 64  . Breast cancer Other        maternal grandmother's sister, dx 35s, d. 73s   Her father is currently 39, and her mother is 62 as of 06/2020.  [name] [name] has 3 brothers and 4 sisters. She reports ovarian cancer in her sister at age 52 (sister is doing well now) and breast cancer in a maternal great-aunt (post-menopausal).   GYNECOLOGIC HISTORY:  No LMP recorded. Menarche: 49 years old Age at first live birth: 49 years old [place] P 1 LMP 7/5 - 05/28/2020, periods are irregular Contraceptive: never used HRT n/a  Hysterectomy? no BSO? 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 Husband [name] [name] "[name] [name]" is a Airline pilot. She lives at home with [name] [name]. Daughter [name] [name], age 30, is a Education administrator here in [place]. [name] [name] attends [employer].    ADVANCED DIRECTIVES: In the absence of any documentation to the contrary, the patient's spouse is their HCPOA.    HEALTH MAINTENANCE: Social History   Tobacco Use  . Smoking status: Never Smoker  . Smokeless tobacco: Never Used  Vaping Use  . Vaping Use: Never used  Substance Use Topics  . Alcohol use: Yes    Comment: social  . Drug use: No     Colonoscopy: n/a (age)  PAP: 01/2020  Bone density: n/a (age)   Allergies  Allergen Reactions  . Shellfish Allergy     Throat and ear itching     Current Outpatient Medications  Medication Sig Dispense Refill  . BLACK CURRANT SEED OIL PO Take 1 Dose by mouth every Wednesday. Liquid     . cholecalciferol (VITAMIN D3) 25 MCG (1000 UNIT) tablet Take 2,000 Units by mouth every Tuesday.     . Coenzyme Q10 (COQ-10) 100 MG CAPS Take 100 mg by mouth every Friday.     Marland Kitchen HYDROcodone-acetaminophen (NORCO/VICODIN) 5-325 MG tablet Take 1-2 tablets by mouth every 6 (six) hours as needed for moderate pain or severe pain. 20 tablet 0  . ibuprofen (ADVIL) 200 MG tablet Take 200-400 mg by mouth every 6 (six) hours as needed for headache or moderate pain.     Marland Kitchen levonorgestrel-ethinyl estradiol (SEASONALE) 0.15-0.03 MG tablet Take 1 tablet by mouth daily.     Ernestine Conrad 3-6-9 Fatty Acids (OMEGA  3-6-9 COMPLEX PO) Take 1 capsule by mouth every Sunday.     Marland Kitchen OVER THE COUNTER MEDICATION Take 2 capsules by mouth daily. [facility] Choice Healthy Hair otc supplement    . Prenatal Vit-Fe Fumarate-FA (PRENATAL MULTIVITAMIN) TABS tablet Take 1 tablet by mouth every Monday.     . tamoxifen (NOLVADEX) 20 MG tablet Take 1 tablet (20 mg total) by mouth daily. 90 tablet 12  . Tetrahydrozoline HCl (VISINE OP) Place 1 drop into both eyes daily as needed (redness).     . vitamin E 180 MG (400 UNITS) capsule Take 400 Units by mouth every Thursday.      No current facility-administered medications for this visit.    OBJECTIVE: African-American woman who appears younger than stated age  25:   08/22/20 0837  BP: 112/66  Pulse: 70  Resp: 18  Temp: (!) 97 F (36.1 C)  SpO2: 100%     Body mass index is 26.85 kg/m.   Wt Readings from Last 3 Encounters:  08/22/20 146 lb 12.8 oz (  66.6 kg)  08/15/20 147 lb 4.8 oz (66.8 kg)  08/03/20 149 lb 3.2 oz (67.7 kg)      ECOG FS:1 - Symptomatic but completely ambulatory  Exam not repeated today   LAB RESULTS:  CMP     Component Value Date/Time   NA 138 08/03/2020 1536   K 3.7 08/03/2020 1536   CL 106 08/03/2020 1536   CO2 26 08/03/2020 1536   GLUCOSE 125 (H) 08/03/2020 1536   BUN 14 08/03/2020 1536   CREATININE 0.91 08/03/2020 1536   CALCIUM 9.1 08/03/2020 1536   PROT 7.2 08/03/2020 1536   ALBUMIN 3.8 08/03/2020 1536   AST 47 (H) 08/03/2020 1536   ALT 58 (H) 08/03/2020 1536   ALKPHOS 61 08/03/2020 1536   BILITOT 0.4 08/03/2020 1536   GFRNONAA >60 08/03/2020 1536   GFRAA >60 08/03/2020 1536    No results found for: TOTALPROTELP, ALBUMINELP, A1GS, A2GS, BETS, BETA2SER, GAMS, MSPIKE, SPEI  Lab Results  Component Value Date   WBC 6.0 08/03/2020   NEUTROABS 2.9 08/03/2020   HGB 13.1 08/03/2020   HCT 37.6 08/03/2020   MCV 89.7 08/03/2020   PLT 351 08/03/2020    No results found for: LABCA2  No components found for:  [id number]  No results for input(s): INR in the last 168 hours.  No results found for: LABCA2  No results found for: MWN027  No results found for: OZD664  No results found for: QIH474  No results found for: CA2729  No components found for: HGQUANT  No results found for: CEA1 / No results found for: CEA1   No results found for: AFPTUMOR  No results found for: CHROMOGRNA  No results found for: KPAFRELGTCHN, LAMBDASER, KAPLAMBRATIO (kappa/lambda light chains)  No results found for: HGBA, HGBA2QUANT, HGBFQUANT, HGBSQUAN (Hemoglobinopathy evaluation)   No results found for: LDH  No results found for: IRON, TIBC, IRONPCTSAT (Iron and TIBC)  No results found for: FERRITIN  Urinalysis No results found for: COLORURINE, APPEARANCEUR, LABSPEC, PHURINE, GLUCOSEU, HGBUR, BILIRUBINUR, KETONESUR, PROTEINUR, UROBILINOGEN, NITRITE, LEUKOCYTESUR   STUDIES: NM Sentinel Node Inj-No Rpt (Breast)  Result Date: 07/28/2020 Sulfur colloid was injected by the nuclear medicine technologist for melanoma sentinel node.      ELIGIBLE FOR AVAILABLE RESEARCH PROTOCOL: AET  ASSESSMENT: 49 y.o. [name] [name] woman status post right breast biopsy x2 on 06/27/2020 for an mT1c N0, stage IA invasive ductal carcinoma, grade 1 or 2, estrogen and progesterone receptor positive, HER-2 not amplified, with an MIB-1 of 2%.  (a) and equivocal right axillary lymph node was biopsied 06/27/2020 and was benign  (1) genetics testing recommended on 07/07/2020 and declined  (2)  S/p right lumpectomy 07/28/2020 for an  mpT1c pN0, stage IA invasive ductal carcinoma, grade 2, with negative margins   (a) a total of 4 right axillary lymph nodes removed, all clear  (3) Oncotype score of 28 predicts a risk of this breast cancer recurring outside the breast within the next 9 years of 17% if the patient's only systemic therapy is antiestrogens for 5 years.  It also predicts a significant benefit from chemotherapy.  (4)  adjuvant chemotherapy consisting of docetaxel and cyclophosphamide given every 21 days x 4 recommended  (5) adjuvant radiation to follow  (6) tamoxifen started 08/22/2020 in anticipation of possible adjuvant treatment delay  (a) consider goserelin/anastrozole   PLAN: Kemper was able to understand her situation better and also I was able to understand her reluctance to receive chemotherapy.  She had been repeatedly told that  her tumor was not grade 3 and not fast growing and that therefore she most likely would get no benefit from chemotherapy.  We had in the discussed the fact that there would be an Oncotype test but this was not emphasized.  When the test came back showing a significant risk of stage IV disease without chemotherapy and also predicting significant benefit from chemotherapy this was discordant.  It makes sense for her to question it.  She understands that to the best of my ability to tell she would have a 17% chance of developing breast cancer in her liver lungs bones or brain or elsewhere outside the breast within the next few years if she does not receive chemotherapy in addition to antiestrogens.  She understands that stage IV breast cancer which is what that would represent is not curable.  Finally she understands that if she does receive chemotherapy I am quoting her a 5% risk of recurrence outside the breast for a benefit of 12%.  What she would like to do is basically repeat the test.  She is considering MammaPrint.  She tells me that if the MammaPrint comes high risk then she would definitely accept chemotherapy.  If the MammaPrint comes low risk probably she would not.  She is going to discuss this further with a second opinion which she is trying to schedule at [facility].  In the meantime we discussed antiestrogens.  Given that she is still premenopausal that means tamoxifen.  She understands tamoxifen does not block estrogen production. It does not "take away a  woman's estrogen". It blocks the estrogen receptor in breast cells. Like anastrozole, it can also cause hot flashes. As opposed to anastrozole, tamoxifen has many estrogen-like effects. It is technically an estrogen receptor modulator. This means that in some tissues tamoxifen works like estrogen-- for example it helps strengthen the bones. It tends to improve the cholesterol profile. It can cause thickening of the endometrial lining, and even endometrial polyps or rarely cancer of the uterus.(The risk of uterine cancer due to tamoxifen is one additional cancer per thousand women year). It can cause vaginal wetness or stickiness. It can cause blood clots through this estrogen-like effect--the risk of blood clots with tamoxifen is exactly the same as with birth control pills or hormone replacement.  Tamoxifen does not cause mood changes or weight gain, despite the popular belief that they can have these side effects. We have data from studies comparing either of these drugs with placebo, and in those cases the control group had the same amount of weight gain and depression as the group that took the drug.  We are starting that today and she will let me know if she has any side effects from it.  Tentatively I am making her a return appointment with me in approximately a month by which time she should have made a definitive decision to proceed either with chemotherapy then radiation or radiation alone.  Total encounter time 30 minutes.Sarajane Jews C. Lareen Mullings, MD 08/22/20 11:51 AM Medical Oncology and Hematology [facility] [place], [place] [address] Tel. [phone]    Fax. [phone]   I, Wilburn Mylar, am acting as scribe for Dr. Virgie Dad. Alden Bensinger.  I, Lurline Del MD, have reviewed the above documentation for accuracy and completeness, and I agree with the above.   *Total Encounter Time as defined by the Centers for Medicare and Medicaid Services includes, in  addition to the face-to-face time of  a patient visit (documented in the note above) non-face-to-face time: obtaining and reviewing outside history, ordering and reviewing medications, tests or procedures, care coordination (communications with other health care professionals or caregivers) and documentation in the medical record.

## 2020-08-22 ENCOUNTER — Other Ambulatory Visit: Payer: Self-pay | Admitting: Oncology

## 2020-08-22 ENCOUNTER — Inpatient Hospital Stay: Payer: 59 | Attending: Oncology | Admitting: Oncology

## 2020-08-22 ENCOUNTER — Other Ambulatory Visit: Payer: Self-pay

## 2020-08-22 VITALS — BP 112/66 | HR 70 | Temp 97.0°F | Resp 18 | Ht 62.0 in | Wt 146.8 lb

## 2020-08-22 DIAGNOSIS — Z8041 Family history of malignant neoplasm of ovary: Secondary | ICD-10-CM | POA: Insufficient documentation

## 2020-08-22 DIAGNOSIS — Z803 Family history of malignant neoplasm of breast: Secondary | ICD-10-CM | POA: Diagnosis not present

## 2020-08-22 DIAGNOSIS — J45909 Unspecified asthma, uncomplicated: Secondary | ICD-10-CM | POA: Insufficient documentation

## 2020-08-22 DIAGNOSIS — C50811 Malignant neoplasm of overlapping sites of right female breast: Secondary | ICD-10-CM | POA: Insufficient documentation

## 2020-08-22 DIAGNOSIS — Z793 Long term (current) use of hormonal contraceptives: Secondary | ICD-10-CM | POA: Insufficient documentation

## 2020-08-22 DIAGNOSIS — F32A Depression, unspecified: Secondary | ICD-10-CM | POA: Insufficient documentation

## 2020-08-22 DIAGNOSIS — Z170 Estrogen receptor positive status [ER+]: Secondary | ICD-10-CM | POA: Diagnosis not present

## 2020-08-22 DIAGNOSIS — Z79899 Other long term (current) drug therapy: Secondary | ICD-10-CM | POA: Insufficient documentation

## 2020-08-22 MED ORDER — TAMOXIFEN CITRATE 20 MG PO TABS: 20.0000 mg | ORAL_TABLET | Freq: Every day | ORAL | 12 refills | Status: AC

## 2020-08-23 ENCOUNTER — Other Ambulatory Visit: Payer: Self-pay | Admitting: *Deleted

## 2020-08-23 ENCOUNTER — Telehealth: Payer: Self-pay

## 2020-08-23 NOTE — Telephone Encounter (Signed)
Left message to check in again.  [name] Counseling Intern

## 2020-08-25 ENCOUNTER — Encounter: Payer: Self-pay | Admitting: *Deleted

## 2020-08-25 ENCOUNTER — Other Ambulatory Visit: Payer: Self-pay | Admitting: *Deleted

## 2020-08-25 DIAGNOSIS — C50811 Malignant neoplasm of overlapping sites of right female breast: Secondary | ICD-10-CM

## 2020-08-25 DIAGNOSIS — Z170 Estrogen receptor positive status [ER+]: Secondary | ICD-10-CM

## 2020-08-30 ENCOUNTER — Encounter: Payer: Self-pay | Admitting: *Deleted

## 2020-08-30 ENCOUNTER — Telehealth: Payer: Self-pay | Admitting: *Deleted

## 2020-08-30 NOTE — Telephone Encounter (Signed)
Spoke with patient for an update on her 2nd opinion.  She states [facility] did confirm what she was told here. She still hasn't made a decision regarding therapy but is scheduled on 10/15 to start chemo at [facility].  Informed her to let us know what she decides.  She states she will know by Thursday and will call us with her decision.

## 2020-08-31 ENCOUNTER — Encounter: Payer: Self-pay | Admitting: Oncology

## 2020-09-02 ENCOUNTER — Telehealth: Payer: Self-pay | Admitting: *Deleted

## 2020-09-02 ENCOUNTER — Encounter: Payer: Self-pay | Admitting: *Deleted

## 2020-09-02 NOTE — Telephone Encounter (Signed)
Received call back from patient stating she is receiving her 1st cycle of chemo today at [facility].  She states she will call if she decides to have xrt here. Encouraged her to call if any needs or concerns arise.

## 2020-09-02 NOTE — Telephone Encounter (Signed)
Received a voicemail from the patient stating she has decided to have her chemo treatment at [facility]. She actually is scheduled to start there today. She stated in her message that she was unsure if she would receive xrt here or at [facility].    I called her back and had to leave voicemail asking her to call me back and let me know whether to cancel her appt with Dr. Jana Hakim. I would assume so just wanted to make sure. I have sent a message to have her referral cancelled for xrt.

## 2020-09-14 ENCOUNTER — Encounter: Payer: Self-pay | Admitting: Oncology

## 2020-09-15 ENCOUNTER — Encounter: Payer: Self-pay | Admitting: Oncology

## 2020-09-15 ENCOUNTER — Other Ambulatory Visit: Payer: Self-pay | Admitting: Oncology

## 2020-09-15 NOTE — Progress Notes (Signed)
Jeanett Schlein has opted for chemotherapy after obtaining a second opinion at [facility] and she is being treated there.  She is also using a [employer] cap.  The note below was sent to the chemotherapy nurse from the patient:  [facility] confirmed the. Diagnosis. Thank you for all your help!  I decided to go with [facility] mainly because their treatments were guaranteed to be on Fridays. In addition  [name] and I have our own room with bathroom and TV accommodation due to the [place].  Lastly, I know you were being informative when you made the comment, " you will be as Junius Creamer as myself and your husband by week 21". Nevertheless the comment was very hurtful especially since Chemotherapy was never in the original plan.   My plan is to return to [facility] further reminder of my treatment.   I am writing Jeanett Schlein a note apologizing for the comments that she took as hurtful.  She understands that we will be glad to participate in her care at any level that she chooses.  (It appears from her know that she will be receiving radiation here).

## 2020-09-19 ENCOUNTER — Encounter: Payer: Self-pay | Admitting: General Practice

## 2020-09-19 NOTE — Progress Notes (Signed)
[name] Spiritual Care Note  [name] [name] by phone for a pastoral check-in. She is using her tools of discernment, listening, reflection, self-awareness, and prayer to help her cope with and make meaning of the change in her treatment plan to include chemotherapy. [name] [name] is also using the devotional practice of deeply enjoying the autumn leaves' color change this year as a Product/process development scientist for paying deep attention and making a discipline of returning to the present, which in turn is helping her to live fully even in the midst of treatment. We plan to follow up by phone in ca two weeks for another pastoral check-in.   [place], [place], [facility] Pager [phone] Voicemail [phone]

## 2020-09-20 ENCOUNTER — Ambulatory Visit: Payer: 59 | Attending: General Surgery | Admitting: Physical Therapy

## 2020-09-20 ENCOUNTER — Other Ambulatory Visit: Payer: Self-pay

## 2020-09-20 ENCOUNTER — Encounter: Payer: Self-pay | Admitting: Physical Therapy

## 2020-09-20 DIAGNOSIS — Z483 Aftercare following surgery for neoplasm: Secondary | ICD-10-CM | POA: Insufficient documentation

## 2020-09-20 DIAGNOSIS — C50511 Malignant neoplasm of lower-outer quadrant of right female breast: Secondary | ICD-10-CM | POA: Insufficient documentation

## 2020-09-20 DIAGNOSIS — Z170 Estrogen receptor positive status [ER+]: Secondary | ICD-10-CM | POA: Diagnosis present

## 2020-09-20 DIAGNOSIS — M25611 Stiffness of right shoulder, not elsewhere classified: Secondary | ICD-10-CM | POA: Diagnosis present

## 2020-09-20 DIAGNOSIS — M79601 Pain in right arm: Secondary | ICD-10-CM | POA: Diagnosis present

## 2020-09-20 NOTE — Patient Instructions (Signed)
First of all, check with your insurance company to see if provider is in [place] (for wigs and compression sleeves / gloves/gauntlets )  [place], [place] [address] [phone]  Will file some insurances --- call for appointment   Second to [facility] (for mastectomy prosthetics and garments) [place], [place] [address] [phone] Will file some insurances --- call for appointment  [facility]  [address]  [place], [place] [address] [phone] Lower extremity garments  [facility] [place], [place]  [address] [place] and Prosthetics (for compression garments, especilly for lower extremities) [address] [place], [place]  [address] [phone] Call for appointment    Jerrol Banana ,certified fitter [place]  [phone]  [employer] (for mastectomy supplies and garments) [place] [place],  [address] [phone]  Other Resources: National Lymphedema Network:  www.lymphnet.org www.Klosetraining.com for patient articles and self manual lymph drainage information www.lymphedemablog.com has informative articles.  [email or web address]

## 2020-09-20 NOTE — Therapy (Signed)
[place], [place], [address] Phone: [phone]   Fax:  [phone]  Physical Therapy Re-Evaluation  Patient Details  Name: [name] [name] MRN: [id number] Date of Birth: [birth year] Referring Provider (PT): Dr. Autumn Messing   Encounter Date: 09/20/2020   PT End of Session - 09/20/20 1708    Visit Number 1    Number of Visits 9    Date for PT Re-Evaluation 10/20/20    PT Start Time 1505    PT Stop Time 1605    PT Time Calculation (min) 60 min    Activity Tolerance Patient tolerated treatment well    Behavior During Therapy [facility] for tasks assessed/performed           Past Medical History:  Diagnosis Date  . Asthma   . Cancer ([place])   . Family history of breast cancer 07/08/2020  . Family history of ovarian cancer 07/08/2020  . Genital herpes     Past Surgical History:  Procedure Laterality Date  . BREAST LUMPECTOMY WITH RADIOACTIVE SEED AND SENTINEL LYMPH NODE BIOPSY Right 07/28/2020   Procedure: RIGHT BREAST LUMPECTOMY X 3  WITH RADIOACTIVE SEED AND SENTINEL LYMPH NODE BIOPSY;  Surgeon: Jovita Kussmaul, MD;  Location: [facility];  Service: General;  Laterality: Right;  PEC BLOCK  . FOOT SURGERY Right   . MOUTH SURGERY Right     There were no vitals filed for this visit.    Subjective Assessment - 09/20/20 1521    Subjective Pt developed a seroma in her right breast since last visit and had it drained Oct 11.2021.  After that she developed some tightness  and pulling down her right medial arm toward wrist. she has returned to the gym and has been doing UE exercise with 12 pound weights and finds that she has more fullness in her armpit after exercise. She has started chemotherapy that she is getting at [facility].    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 It is ER/PR positive and HER2 negative with a Ki67 of 2%. Patient reports  she underwent a right lumpectomy and sentinel node biopsy (4 negative nodes) on 07/28/2020. She developed a seroma in her right axilla that has been drained and now has cording in her right axilla. She is undergoing chemotherapy and is not sure if she will have radiation.    Patient Stated Goals to get rid of  the pulling in her arm and make sure it is ok    Currently in Pain? No/denies   most pulling in the morning that gets better during the day             [facility] PT Assessment - 09/20/20 0001      Assessment   Medical Diagnosis s/p right lumpectomy and SLNB    Referring Provider (PT) Dr. Autumn Messing    Onset Date/Surgical Date 07/28/20    Hand Dominance Right    Prior Therapy Baselines      Precautions   Precautions Other (comment)    Precaution Comments recent surgery, at risk for lymphedema       Restrictions   Weight Bearing Restrictions No      Balance Screen   Has the patient fallen in the past 6 months No    Has the patient had a decrease in activity level because of a fear of falling?  No    Is the patient reluctant to  leave their home because of a fear of falling?  No      Home Environment   Living Environment Private residence    Living Arrangements Spouse/significant other    Available Help at Discharge Family      Prior Function   Level of Independence Independent    Vocation Full time employment    Vocation Requirements [place]   out of work til first of the year   Leisure She has returned to the gym and is working her core and legs and is walking on the treadmill for 15 minutes. and had been lifting 12 pound weights       Cognition   Overall Cognitive Status Within Functional Limits for tasks assessed      Observation/Other Assessments   Observations pt with healed incsion at right breast and axilla  visible fullness in axilla with several strings of cording in axilla and dimpling of skin down arm       Posture/Postural Control    Posture/Postural Control No significant limitations      ROM / Strength   AROM / PROM / Strength AROM      AROM   Right Shoulder Extension --    Right Shoulder Flexion 150 Degrees    Right Shoulder ABduction 165 Degrees   fist feels cording pull at 135    Right Shoulder Internal Rotation --    Right Shoulder External Rotation --      Strength   Overall Strength Within functional limits for tasks performed      Palpation   Palpation comment firmness at right axilla. and tightness in axillary cording down into medicat elbow                         Objective measurements completed on examination: See above findings.       [facility] Adult PT Treatment/Exercise - 09/20/20 0001      Self-Care   Self-Care Other Self-Care Comments    Other Self-Care Comments  gave pt medium TG soft with large fold at the top and a chip pack to wear at right axillary fullness.  She was given information to get a compression bra       Manual Therapy   Manual Therapy Edema management    Manual therapy comments introduced pt to manual lymph drainage stretching to cording.                   PT Education - 09/20/20 1707    Education Details where to get a compression bra, how to use TG soft for comfort only    Person(s) Educated Patient    Methods Explanation;Demonstration;Handout    Comprehension Verbalized understanding               PT Long Term Goals - 09/20/20 1718      PT LONG TERM GOAL #1   Title Pt will have 150 degrees of right shoulder abduction with no pulling from axillary cording.    Time 4    Period Weeks    Status New      PT LONG TERM GOAL #2   Title Pt will know how to manage the fullness and pain from cording in her right axilla with MLD, compression and exercise    Time 4    Period Weeks    Status New  Plan - 09/20/20 1709    Clinical Impression Statement Pt developed a seroma and axillary cording after returning to the  gym and lifting 12 pound weights.  She was advised to discountinue lifting weights until cording is improved but is ok to keep doing stretching with arms and LE exercise. Pt was give tg soft and chip pack for light compression to her arm and axilla and give information and presctiption to get a compression bra. She may need to get a compression sleeve for exercising if the tg soft helps. Pt will benefit from PT for soft tissue work, stretching and education.    Personal Factors and Comorbidities Comorbidity 2    Comorbidities previous lumpectomy and 4 nodes removed, Ongoing chemotherapy    Stability/Clinical Decision Making Stable/Uncomplicated    Rehab Potential Excellent    PT Treatment/Interventions ADLs/Self Care Home Management;Therapeutic exercise;Patient/family education    PT Next Visit Plan how was tg soft ?chip pack?  need to get compression sleeve for exercise?  start MLD and stretching to cording in axilla    PT Home Exercise Plan Post op shoulder ROM HEP and closed chain flexion and abduction    Consulted and Agree with Plan of Care Patient           Patient will benefit from skilled therapeutic intervention in order to improve the following deficits and impairments:  Postural dysfunction, Decreased knowledge of precautions, Impaired UE functional use, Pain, Decreased range of motion  Visit Diagnosis: Aftercare following surgery for neoplasm - Plan: PT plan of care cert/re-cert  Pain in right arm - Plan: PT plan of care cert/re-cert  Stiffness of right shoulder joint - Plan: PT plan of care cert/re-cert     Problem List Patient Active Problem List   Diagnosis Date Noted  . Family history of ovarian cancer 07/08/2020  . Family history of breast cancer 07/08/2020  . Malignant neoplasm of overlapping sites of right breast in female, estrogen receptor positive ([facility]) 07/01/2020  . Murmur 03/09/2014  . Bradycardia 03/09/2014  . Genital herpes    Teresa K. Brown,  PT  Brown, Teresa Krall 09/20/2020, 5:25 PM  [facility] [address] [place], , [address] Phone: [phone]   Fax:  [phone]  Name: Jeanette Asano MRN: [id number] Date of Birth: 11/12/1971  

## 2020-09-22 ENCOUNTER — Ambulatory Visit: Payer: 59 | Admitting: Oncology

## 2020-09-22 ENCOUNTER — Other Ambulatory Visit: Payer: Self-pay

## 2020-09-22 ENCOUNTER — Ambulatory Visit: Payer: 59 | Admitting: Physical Therapy

## 2020-09-22 DIAGNOSIS — Z483 Aftercare following surgery for neoplasm: Secondary | ICD-10-CM

## 2020-09-22 DIAGNOSIS — M25611 Stiffness of right shoulder, not elsewhere classified: Secondary | ICD-10-CM

## 2020-09-22 DIAGNOSIS — M79601 Pain in right arm: Secondary | ICD-10-CM

## 2020-09-22 NOTE — Therapy (Signed)
[place], [place], [address] Phone: [phone]   Fax:  [phone]  Physical Therapy Treatment  Patient Details  Name: [name] [name] MRN: [id number] Date of Birth: 1971/10/30 Referring Provider (PT): Dr. Autumn Messing   Encounter Date: 09/22/2020   PT End of Session - 09/22/20 1715    Visit Number 2    Number of Visits 9    Date for PT Re-Evaluation 10/20/20    PT Start Time 1500    PT Stop Time 1550    PT Time Calculation (min) 50 min    Activity Tolerance Patient tolerated treatment well    Behavior During Therapy [facility] for tasks assessed/performed           Past Medical History:  Diagnosis Date  . Asthma   . Cancer ([place])   . Family history of breast cancer 07/08/2020  . Family history of ovarian cancer 07/08/2020  . Genital herpes     Past Surgical History:  Procedure Laterality Date  . BREAST LUMPECTOMY WITH RADIOACTIVE SEED AND SENTINEL LYMPH NODE BIOPSY Right 07/28/2020   Procedure: RIGHT BREAST LUMPECTOMY X 3  WITH RADIOACTIVE SEED AND SENTINEL LYMPH NODE BIOPSY;  Surgeon: Jovita Kussmaul, MD;  Location: [facility];  Service: General;  Laterality: Right;  PEC BLOCK  . FOOT SURGERY Right   . MOUTH SURGERY Right     There were no vitals filed for this visit.   Subjective Assessment - 09/22/20 1502    Subjective Pt states that she wore her tg soft the rest of that day but hasnt had it on since.  She says that she thinks she can raise her arm more with less pain, but she does not want to get dependent on it.  She has worn her chip pack today while she was working on the computer. 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 It is ER/PR positive and HER2 negative with a Ki67 of 2%. Patient reports she underwent a right lumpectomy and sentinel node biopsy (4 negative nodes) on 07/28/2020. She developed  a seroma in her right axilla that has been drained and now has cording in her right axilla. She is undergoing chemotherapy and is not sure if she will have radiation.    Patient Stated Goals to get rid of  the pulling in her arm and make sure it is ok    Currently in Pain? No/denies              [facility] PT Assessment - 09/22/20 0001      Assessment   Medical Diagnosis s/p right lumpectomy and SLNB    Referring Provider (PT) Dr. Autumn Messing    Onset Date/Surgical Date 07/28/20      Prior Function   Level of Independence Independent                         [facility] Adult PT Treatment/Exercise - 09/22/20 0001      Exercises   Exercises Shoulder      Shoulder Exercises: Supine   Protraction AROM;Right;5 reps      Shoulder Exercises: Seated   Other Seated Exercises on rep of slow neck ROM in all directions with breath, upper thoracic ROM in all directions       Shoulder Exercises: Sidelying   ABduction AROM;Right;Both    Other Sidelying  Exercises small circles with hand pointed to ceiling with pt hand gently on seroma       Manual Therapy   Manual Therapy Edema management;Soft tissue mobilization;Manual Lymphatic Drainage (MLD);Passive ROM    Manual therapy comments continued education about the lymph system and instructed pt in skin stretch at abdomen below right breast in supine and also in sidelying for decongestion of the scar area with use of mirror so pt could see the effect of stretch     Soft tissue mobilization skin mobilizaion to cords with skin bending .     Manual Lymphatic Drainage (MLD) short neck , superficial and deep abdominals, right inguinal nodes, right axillary inguinal anastamosis,  right abdomen below the breast,  left axillary nodes anterior interaxillary anastamosis  then to sidelyin for more work on lateral anastamosis and back for postrerior anastamosis, right upper arm to axilla and repeated trunk     Passive ROM PROM with shoulder with elbow  bent to extreme of ROM,  Pt still with cording evident but not as much pain below elbow with it bent                   PT Education - 09/22/20 1600    Education Details encouraged pt to put chip pack inside sports bra at scar during the day    Person(s) Educated Patient    Methods Explanation    Comprehension Verbalized understanding               PT Long Term Goals - 09/22/20 1716      PT LONG TERM GOAL #1   Title Pt will have 150 degrees of right shoulder abduction with no pulling from axillary cording.    Time 4    Period Weeks    Status On-going      PT LONG TERM GOAL #2   Title Pt will know how to manage the fullness and pain from cording in her right axilla with MLD, compression and exercise    Period Weeks    Status On-going                 Plan - 09/22/20 1601    Clinical Impression Statement Pt has had some initial response from compression and did well with intial session of manual work.  Pt reported she felt better at end of session and palpable softening at breast scar and seroma was felt.  Cording still present.  Pt concouraed to do self skin stretch and wear compression until next session    Personal Factors and Comorbidities Comorbidity 2    Comorbidities previous lumpectomy and 4 nodes removed, Ongoing chemotherapy    Stability/Clinical Decision Making Stable/Uncomplicated    Clinical Decision Making Low    Rehab Potential Excellent    PT Frequency 2x / week    PT Duration 4 weeks    PT Treatment/Interventions ADLs/Self Care Home Management;Therapeutic exercise;Patient/family education;Manual techniques;Manual lymph drainage;Compression bandaging;Scar mobilization;Passive range of motion    PT Next Visit Plan how was tg soft ?chip pack? Continue MLD and stretching,  to cording in axilla  PROM and soft tissue work, myofasciial release?? need to get compression sleeve for exercise    PT Home Exercise Plan Post op shoulder ROM HEP and closed  chain flexion and abduction    Consulted and Agree with Plan of Care Patient           Patient will benefit from skilled therapeutic intervention in order to improve the following  deficits and impairments:  Postural dysfunction, Decreased knowledge of precautions, Impaired UE functional use, Pain, Decreased range of motion  Visit Diagnosis: Aftercare following surgery for neoplasm - Plan: PT plan of care cert/re-cert  Pain in right arm - Plan: PT plan of care cert/re-cert  Stiffness of right shoulder joint - Plan: PT plan of care cert/re-cert     Problem List Patient Active Problem List   Diagnosis Date Noted  . Family history of ovarian cancer 07/08/2020  . Family history of breast cancer 07/08/2020  . Malignant neoplasm of overlapping sites of right breast in female, estrogen receptor positive ([place]) 07/01/2020  . Murmur 03/09/2014  . Bradycardia 03/09/2014  . Genital herpes    Donato Heinz. Owens Shark PT  Norwood Levo 09/22/2020, 5:26 PM  [place], [place], [address] Phone: [phone]   Fax:  [phone]  Name: Lexandra Rettke MRN: [id number] Date of Birth: [birth year]

## 2020-09-26 ENCOUNTER — Ambulatory Visit: Payer: 59

## 2020-09-26 ENCOUNTER — Other Ambulatory Visit: Payer: Self-pay

## 2020-09-26 DIAGNOSIS — Z483 Aftercare following surgery for neoplasm: Secondary | ICD-10-CM | POA: Diagnosis not present

## 2020-09-26 DIAGNOSIS — Z170 Estrogen receptor positive status [ER+]: Secondary | ICD-10-CM

## 2020-09-26 DIAGNOSIS — C50511 Malignant neoplasm of lower-outer quadrant of right female breast: Secondary | ICD-10-CM

## 2020-09-26 DIAGNOSIS — M25611 Stiffness of right shoulder, not elsewhere classified: Secondary | ICD-10-CM

## 2020-09-26 DIAGNOSIS — M79601 Pain in right arm: Secondary | ICD-10-CM

## 2020-09-26 NOTE — Therapy (Signed)
[place], [name], [address] Phone: [phone]   Fax:  [phone]  Physical Therapy Treatment  Patient Details  Name: [name] [name] MRN: [id number] Date of Birth: [birth year] Referring Provider (PT): Dr. Autumn Messing   Encounter Date: 09/26/2020   PT End of Session - 09/26/20 1408    Visit Number 3    Number of Visits 9    Date for PT Re-Evaluation 10/20/20    PT Start Time 5374    PT Stop Time 1408    PT Time Calculation (min) 51 min    Activity Tolerance Patient tolerated treatment well    Behavior During Therapy [facility] for tasks assessed/performed           Past Medical History:  Diagnosis Date  . Asthma   . Cancer ([place])   . Family history of breast cancer 07/08/2020  . Family history of ovarian cancer 07/08/2020  . Genital herpes     Past Surgical History:  Procedure Laterality Date  . BREAST LUMPECTOMY WITH RADIOACTIVE SEED AND SENTINEL LYMPH NODE BIOPSY Right 07/28/2020   Procedure: RIGHT BREAST LUMPECTOMY X 3  WITH RADIOACTIVE SEED AND SENTINEL LYMPH NODE BIOPSY;  Surgeon: Jovita Kussmaul, MD;  Location: [facility];  Service: General;  Laterality: Right;  PEC BLOCK  . FOOT SURGERY Right   . MOUTH SURGERY Right     There were no vitals filed for this visit.   Subjective Assessment - 09/26/20 1323    Subjective I'm sorry we're starting late, I had some questions about my bill. My cording is worse in the morning. I've been doing the stretches Helene Kelp showed me and I can tell the cord isn't as tight. . I've worn the soft sleeve (TG soft)  Helene Kelp gave me last time and I don't know if I can tell a difference from that yet.    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 It is ER/PR positive and HER2 negative with a Ki67 of 2%. Patient reports she underwent a right lumpectomy and sentinel node biopsy (4 negative nodes) on 07/28/2020.  She developed a seroma in her right axilla that has been drained and now has cording in her right axilla. She is undergoing chemotherapy and is not sure if she will have radiation.    Patient Stated Goals to get rid of  the pulling in her arm and make sure it is ok    Currently in Pain? No/denies                             [facility] Adult PT Treatment/Exercise - 09/26/20 0001      Manual Therapy   Manual Therapy Myofascial release;Passive ROM    Myofascial Release To Rt axilla, upper arm and antecubital fossa at area of cords. 2 pops were felt by therapist and pt heard 1    Passive ROM In supine to Rt shoulder into flexion, abduction and D2 incorporating MFR, pt with some improved end motion by end of session and after cords pop                       PT Long Term Goals - 09/22/20 1716      PT LONG TERM GOAL #1   Title Pt will have 150 degrees of right shoulder abduction with no pulling from axillary cording.  Time 4    Period Weeks    Status On-going      PT LONG TERM GOAL #2   Title Pt will know how to manage the fullness and pain from cording in her right axilla with MLD, compression and exercise    Period Weeks    Status On-going                 Plan - 09/26/20 1729    Clinical Impression Statement Pt arrived on time for treatment but spoke with Asencion Partridge for awhile with questions about her charges. Once she was educated about her coverage she agrees to continue treatment so started late today. Focused on manual therapy working to decrease fascial tightness at multiple areas of cording along Rt UE. Also incorporated P/ROM which improved by end of session. Pt asked alot of pertinent questions about cording and how to continue working on this at home. She repotrs feeling encouraged by session today and that her motion had improved. Also instructed her how to perform self MFR with end ROM stretching in doorway. Also encouraged her to bring husband to  learn as she reports he could help her with this. She did not wear TG soft but only once, so encouraged her to try to wear this more often to see if it will lessen severity of cording. Pt verbalized understanding.    Personal Factors and Comorbidities Comorbidity 2    Comorbidities previous lumpectomy and 4 nodes removed, Ongoing chemotherapy    Stability/Clinical Decision Making Stable/Uncomplicated    Rehab Potential Excellent    PT Frequency 2x / week    PT Duration 4 weeks    PT Treatment/Interventions ADLs/Self Care Home Management;Therapeutic exercise;Patient/family education;Manual techniques;Manual lymph drainage;Compression bandaging;Scar mobilization;Passive range of motion    PT Next Visit Plan how was tg soft ?chip pack? Continue MLD (instruct pt in this and issue handout) and MFR  to cording in axilla (instruct husband in this when he can come) PROM and soft tissue work; need to get compression sleeve for exercise    PT Home Exercise Plan Post op shoulder ROM HEP and closed chain flexion and abduction; end ROM stretching in doorway with self MFR to cording    Consulted and Agree with Plan of Care Patient           Patient will benefit from skilled therapeutic intervention in order to improve the following deficits and impairments:  Postural dysfunction, Decreased knowledge of precautions, Impaired UE functional use, Pain, Decreased range of motion  Visit Diagnosis: Aftercare following surgery for neoplasm  Pain in right arm  Stiffness of right shoulder joint  Malignant neoplasm of lower-outer quadrant of right breast of female, estrogen receptor positive ([place])     Problem List Patient Active Problem List   Diagnosis Date Noted  . Family history of ovarian cancer 07/08/2020  . Family history of breast cancer 07/08/2020  . Malignant neoplasm of overlapping sites of right breast in female, estrogen receptor positive ([place]) 07/01/2020  . Murmur 03/09/2014  . Bradycardia  03/09/2014  . Genital herpes     [name] [name], PTA 09/26/2020, 5:35 PM  [name], [address] Phone: [phone]   Fax:  [phone]  Name: [name] [name] MRN: [id number] Date of Birth: [birth year]

## 2020-09-29 ENCOUNTER — Encounter: Payer: Self-pay | Admitting: Physical Therapy

## 2020-09-29 ENCOUNTER — Ambulatory Visit: Payer: 59 | Admitting: Physical Therapy

## 2020-09-29 ENCOUNTER — Other Ambulatory Visit: Payer: Self-pay

## 2020-09-29 DIAGNOSIS — M79601 Pain in right arm: Secondary | ICD-10-CM

## 2020-09-29 DIAGNOSIS — M25611 Stiffness of right shoulder, not elsewhere classified: Secondary | ICD-10-CM

## 2020-09-29 DIAGNOSIS — Z483 Aftercare following surgery for neoplasm: Secondary | ICD-10-CM

## 2020-09-29 NOTE — Therapy (Signed)
[place], [place], [address] Phone: [phone]   Fax:  [phone]  Physical Therapy Treatment  Patient Details  Name: [name] [name] MRN: [id number] Date of Birth: 1971-07-20 Referring Provider (PT): Dr. Autumn Messing   Encounter Date: 09/29/2020   PT End of Session - 09/29/20 1613    Visit Number 4    Number of Visits 9    Date for PT Re-Evaluation 10/20/20    PT Start Time 1500    PT Stop Time 1600    PT Time Calculation (min) 60 min    Activity Tolerance Patient tolerated treatment well    Behavior During Therapy [facility] for tasks assessed/performed           Past Medical History:  Diagnosis Date  . Asthma   . Cancer ([place])   . Family history of breast cancer 07/08/2020  . Family history of ovarian cancer 07/08/2020  . Genital herpes     Past Surgical History:  Procedure Laterality Date  . BREAST LUMPECTOMY WITH RADIOACTIVE SEED AND SENTINEL LYMPH NODE BIOPSY Right 07/28/2020   Procedure: RIGHT BREAST LUMPECTOMY X 3  WITH RADIOACTIVE SEED AND SENTINEL LYMPH NODE BIOPSY;  Surgeon: Jovita Kussmaul, MD;  Location: [facility];  Service: General;  Laterality: Right;  PEC BLOCK  . FOOT SURGERY Right   . MOUTH SURGERY Right     There were no vitals filed for this visit.   Subjective Assessment - 09/29/20 1606    Subjective Pressure in her breast that was temporary.  Pt had some numbness and tingling in her arm after taking of the tg soft  and has not been wearing it. 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 It is ER/PR positive and HER2 negative with a Ki67 of 2%. Patient reports she underwent a right lumpectomy and sentinel node biopsy (4 negative nodes) on 07/28/2020. She developed a seroma in  her right axilla that has been drained and now has cording in her right axilla. She is undergoing chemotherapy and is not sure if she will have radiation.    Patient Stated Goals to get rid of  the pulling in her arm and make sure it is ok    Currently in Pain? No/denies                             [facility] Adult PT Treatment/Exercise - 09/29/20 0001      Shoulder Exercises: Sidelying   ABduction AROM;Right;5 reps   left hand on right axilla full area gently    Other Sidelying Exercises small circles with hand pointed to ceiling with pt hand gently on seroma       Manual Therapy   Manual Therapy Soft tissue mobilization;Myofascial release;Manual Lymphatic Drainage (MLD)    Manual therapy comments instructed husband in basics of lymphatic system and techniques of MLD, myofascial skin strech to cords, C stroke and S stroke soft tissue work around cord.     Myofascial Release to right axilla     Manual Lymphatic Drainage (MLD) short neck , superficial and deep abdominals, right inguinal nodes, right axillary inguinal anastamosis,  right abdomen below the breast,  left axillary nodes anterior interaxillary anastamosis  then to sidelyin for more work on lateral anastamosis and back for postrerior anastamosis, right upper arm to axilla and repeated trunk    gentle pressure on seroma druing lateral trunk work                       PT Long Term Goals - 09/22/20 1716      PT LONG TERM GOAL #1   Title Pt will have 150 degrees of right shoulder abduction with no pulling from axillary cording.    Time 4    Period Weeks    Status On-going      PT LONG TERM GOAL #2   Title Pt will know how to manage the fullness and pain from cording in her right axilla with MLD, compression and exercise    Period Weeks    Status On-going                 Plan - 09/29/20 1613    Clinical Impression Statement Husband here for education and was able to perform MLD and soft tissue  work with good technique after instruction.  Pt continues with cording in her arm that is more pronounced in sidelying.  Made pt another chip pack and encoraged her to get a compression bra to help hold it in place.  Pt felt better at end of session    Personal Factors and Comorbidities Comorbidity 2    Comorbidities previous lumpectomy and 4 nodes removed, Ongoing chemotherapy    Stability/Clinical Decision Making Stable/Uncomplicated    Rehab Potential Excellent    PT Frequency 2x / week    PT Duration 4 weeks    PT Treatment/Interventions ADLs/Self Care Home Management;Therapeutic exercise;Patient/family education;Manual techniques;Manual lymph drainage;Compression bandaging;Scar mobilization;Passive range of motion    PT Next Visit Plan how was tg soft ?chip pack? Continue MLD  and MFR  to cording in axilla, neural glide exercise   PROM and soft tissue work;?? need to get compression sleeve for exercise    PT Home Exercise Plan Post op shoulder ROM HEP and closed chain flexion and abduction; end ROM stretching in doorway with self MFR to cording    Consulted and Agree with Plan of Care Family member/caregiver;Patient    Family Member Consulted husband           Patient will benefit from skilled therapeutic intervention in order to improve the following deficits and impairments:  Postural dysfunction, Decreased knowledge of precautions, Impaired UE functional use, Pain, Decreased range of motion  Visit Diagnosis: Aftercare following surgery for neoplasm  Pain in right arm  Stiffness of right shoulder joint     Problem List Patient Active Problem List   Diagnosis Date Noted  . Family history of ovarian cancer 07/08/2020  . Family history of breast cancer 07/08/2020  . Malignant neoplasm of overlapping sites of right breast in female, estrogen receptor positive ([place]) 07/01/2020  . Murmur 03/09/2014  . Bradycardia 03/09/2014  . Genital herpes    Donato Heinz. Owens Shark PT  Norwood Levo 09/29/2020, 4:17 PM  [place], [place], [address] Phone: [phone]   Fax:  [phone]  Name: [name] [name] MRN: [id number] Date of Birth: [birth year]

## 2020-09-29 NOTE — Patient Instructions (Signed)
Lie on your left side with right arm on top . Reach arm overhead to stretch. Rest left hand on right full area, reach with diagonals to twist arm and then do small circles  Massage techniques.     Self manual lymph drainage: Perform this sequence once a day.  Only give enough pressure no your skin to make the skin move.  Diaphragmatic - Supine   Inhale through nose making navel move out toward hands. Exhale through puckered lips, hands follow navel in. Repeat _5__ times. Rest _10__ seconds between repeats.   Copyright  VHI. All rights reserved.  Hug yourself.  Do circles at your neck just above your collarbones.  Repeat this 10 times.      Copyright  VHI. All rights reserved.  LEG: Inguinal Nodes Stimulation   With small finger side of hand against hip crease on involved side, gently perform circles at the crease. Repeat __10_ times.   Copyright  VHI. All rights reserved.  1) Axilla to Inguinal Nodes - Sweep   On involved side, sweep _4__ times from armpit along side of trunk to hip crease.  Now gently stretch skin from the involved side to the uninvolved side across the chest at the shoulder line.  Repeat that 4 times.  Draw an imaginary diagonal line from upper outer breast through the nipple area toward lower inner breast.  Direct fluid upward and inward from this line toward the pathway across your upper chest .  Do this in three rows to treat all of the upper inner breast tissue, and do each row 3-4x.      Direct fluid to treat all of lower outer breast tissue downward and outward toward      pathway that is aimed at the left groin.  Finish by doing the pathways as described above going from your involved armpit to the same side groin and going across your upper chest from the involved shoulder to the uninvolved shoulder.  Repeat the steps above where you do circles in your left groin and right armpit. Copyright  VHI. All rights reserved.   Go across the back   Skin strech to release skin by the cord C stroke S stoke

## 2020-10-03 ENCOUNTER — Ambulatory Visit: Payer: 59

## 2020-10-03 ENCOUNTER — Other Ambulatory Visit: Payer: Self-pay

## 2020-10-03 DIAGNOSIS — M25611 Stiffness of right shoulder, not elsewhere classified: Secondary | ICD-10-CM

## 2020-10-03 DIAGNOSIS — Z483 Aftercare following surgery for neoplasm: Secondary | ICD-10-CM

## 2020-10-03 DIAGNOSIS — C50511 Malignant neoplasm of lower-outer quadrant of right female breast: Secondary | ICD-10-CM

## 2020-10-03 DIAGNOSIS — M79601 Pain in right arm: Secondary | ICD-10-CM

## 2020-10-03 DIAGNOSIS — Z170 Estrogen receptor positive status [ER+]: Secondary | ICD-10-CM

## 2020-10-03 NOTE — Patient Instructions (Signed)

## 2020-10-03 NOTE — Therapy (Signed)
[place], [place], [address] Phone: [phone]   Fax:  [phone]  Physical Therapy Treatment  Patient Details  Name: [name] [name] MRN: [id number] Date of Birth: 1971/08/07 Referring Provider (PT): Dr. Autumn Messing   Encounter Date: 10/03/2020   PT End of Session - 10/03/20 1610    Visit Number 5    Number of Visits 9    Date for PT Re-Evaluation 10/20/20    PT Start Time 1510    PT Stop Time 1609    PT Time Calculation (min) 59 min    Activity Tolerance Patient tolerated treatment well    Behavior During Therapy [facility] for tasks assessed/performed           Past Medical History:  Diagnosis Date  . Asthma   . Cancer ([place])   . Family history of breast cancer 07/08/2020  . Family history of ovarian cancer 07/08/2020  . Genital herpes     Past Surgical History:  Procedure Laterality Date  . BREAST LUMPECTOMY WITH RADIOACTIVE SEED AND SENTINEL LYMPH NODE BIOPSY Right 07/28/2020   Procedure: RIGHT BREAST LUMPECTOMY X 3  WITH RADIOACTIVE SEED AND SENTINEL LYMPH NODE BIOPSY;  Surgeon: Jovita Kussmaul, MD;  Location: [facility];  Service: General;  Laterality: Right;  PEC BLOCK  . FOOT SURGERY Right   . MOUTH SURGERY Right     There were no vitals filed for this visit.   Subjective Assessment - 10/03/20 1512    Subjective My husband came last time and learned the MLD and he has been doing great with that. And I'm just feeling good, I can tell I am getting better. Been wearing my compression sleeve during the day and the chip pack. I haven't had the tingling again and my Rt axilla doesn't feel as tight.    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 It is ER/PR positive and HER2 negative with a Ki67 of 2%. Patient reports she underwent a right lumpectomy and sentinel node biopsy (4 negative nodes) on 07/28/2020. She developed a seroma in  her right axilla that has been drained and now has cording in her right axilla. She is undergoing chemotherapy and is not sure if she will have radiation.    Patient Stated Goals to get rid of  the pulling in her arm and make sure it is ok    Currently in Pain? No/denies                             [facility] Adult PT Treatment/Exercise - 10/03/20 0001      Manual Therapy   Soft tissue mobilization Briefly in Lt S/L for STM to tight muscles at Rt lateral trunk inferior to axilla    Myofascial Release to right axilla     Manual Lymphatic Drainage (MLD) short neck , superficial and deep abdominals, right inguinal nodes, right axillary inguinal anastamosis,  right abdomen below the breast,  left axillary nodes anterior interaxillary anastamosis  then to sidelyin for more work on lateral anastamosis and back for postrerior anastamosis, right upper arm to axilla and repeated trunk     Passive ROM In supine to Rt shoulder into flexion, abduction and D2 incorporating MFR, pt with some improved end motion by end of session                  PT  Education - 10/03/20 1606    Education Details Bil UE 3 way raises and instructed in "low and slow" progression    Person(s) Educated Patient    Methods Explanation;Demonstration;Handout    Comprehension Verbalized understanding               PT Long Term Goals - 09/22/20 1716      PT LONG TERM GOAL #1   Title Pt will have 150 degrees of right shoulder abduction with no pulling from axillary cording.    Time 4    Period Weeks    Status On-going      PT LONG TERM GOAL #2   Title Pt will know how to manage the fullness and pain from cording in her right axilla with MLD, compression and exercise    Period Weeks    Status On-going                 Plan - 10/03/20 1728    Clinical Impression Statement Pt returns to physical therapy today reporting noting some improvement since here last week. Her husband has been  performing manual lymph drainage on Rt upper quadrant and pt reports this has been beneficial. Also she has been using the chip pack over her seroma and tolerated this without increased tingling and this morning, after wearing chip pack all day, her axilla was softer. Her Rt UE P/ROM was improved with increased motion noted, though her cording is still very visible, palpable and limiting to her full end motions. Pt continues to note improvement with reduction of tightness at end of sessions.    Personal Factors and Comorbidities Comorbidity 2    Comorbidities previous lumpectomy and 4 nodes removed, Ongoing chemotherapy    Stability/Clinical Decision Making Stable/Uncomplicated    Rehab Potential Excellent    PT Frequency 2x / week    PT Duration 4 weeks    PT Treatment/Interventions ADLs/Self Care Home Management;Therapeutic exercise;Patient/family education;Manual techniques;Manual lymph drainage;Compression bandaging;Scar mobilization;Passive range of motion    PT Next Visit Plan Continue MLD  and MFR  to cording in axilla, neural glide exercise   PROM and soft tissue work    PT Home Exercise Plan Post op shoulder ROM HEP and closed chain flexion and abduction; end ROM stretching in doorway with self MFR to cording    Consulted and Agree with Plan of Care Patient           Patient will benefit from skilled therapeutic intervention in order to improve the following deficits and impairments:  Postural dysfunction, Decreased knowledge of precautions, Impaired UE functional use, Pain, Decreased range of motion  Visit Diagnosis: Aftercare following surgery for neoplasm  Pain in right arm  Stiffness of right shoulder joint  Malignant neoplasm of lower-outer quadrant of right breast of female, estrogen receptor positive ([place])     Problem List Patient Active Problem List   Diagnosis Date Noted  . Family history of ovarian cancer 07/08/2020  . Family history of breast cancer 07/08/2020   . Malignant neoplasm of overlapping sites of right breast in female, estrogen receptor positive ([place]) 07/01/2020  . Murmur 03/09/2014  . Bradycardia 03/09/2014  . Genital herpes     [name], PTA 10/03/2020, 5:33 PM  [place], [place], [address] Phone: [phone]   Fax:  [phone]  Name: Cerinity Zynda MRN: [id number] Date of Birth: 1971/06/28

## 2020-10-07 ENCOUNTER — Ambulatory Visit: Payer: 59 | Admitting: Physical Therapy

## 2020-10-07 ENCOUNTER — Other Ambulatory Visit: Payer: Self-pay

## 2020-10-07 ENCOUNTER — Encounter: Payer: Self-pay | Admitting: Physical Therapy

## 2020-10-07 DIAGNOSIS — Z483 Aftercare following surgery for neoplasm: Secondary | ICD-10-CM

## 2020-10-07 DIAGNOSIS — M25611 Stiffness of right shoulder, not elsewhere classified: Secondary | ICD-10-CM

## 2020-10-07 DIAGNOSIS — M79601 Pain in right arm: Secondary | ICD-10-CM

## 2020-10-07 NOTE — Therapy (Signed)
[place], [place], [address] Phone: [phone]   Fax:  [phone]  Physical Therapy Treatment  Patient Details  Name: [name] [name] MRN: [id number] Date of Birth: 04-20-1971 Referring Provider (PT): Dr. Autumn Messing   Encounter Date: 10/07/2020   PT End of Session - 10/07/20 1013    Visit Number 6    Number of Visits 9    Date for PT Re-Evaluation 10/20/20    PT Start Time 0905    PT Stop Time 0955    PT Time Calculation (min) 50 min    Activity Tolerance Patient tolerated treatment well    Behavior During Therapy [facility] for tasks assessed/performed           Past Medical History:  Diagnosis Date  . Asthma   . Cancer ([place])   . Family history of breast cancer 07/08/2020  . Family history of ovarian cancer 07/08/2020  . Genital herpes     Past Surgical History:  Procedure Laterality Date  . BREAST LUMPECTOMY WITH RADIOACTIVE SEED AND SENTINEL LYMPH NODE BIOPSY Right 07/28/2020   Procedure: RIGHT BREAST LUMPECTOMY X 3  WITH RADIOACTIVE SEED AND SENTINEL LYMPH NODE BIOPSY;  Surgeon: Jovita Kussmaul, MD;  Location: [place];  Service: General;  Laterality: Right;  PEC BLOCK  . FOOT SURGERY Right   . MOUTH SURGERY Right     There were no vitals filed for this visit.   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 It is ER/PR positive and HER2 negative with a Ki67 of 2%. Patient reports she underwent a right lumpectomy and sentinel node biopsy (4 negative nodes) on 07/28/2020. She developed a seroma in her right axilla that has been drained and now has cording in her right axilla. She is undergoing chemotherapy and is not sure if she will have radiation.    Patient Stated Goals to get rid of  the pulling in her arm and make sure it is ok    Currently in  Pain? No/denies                             [facility] Adult PT Treatment/Exercise - 10/07/20 0001      Shoulder Exercises: Supine   Other Supine Exercises over purple ball a thoracic spine with double pillow under head for scapular retraction and protraction, shoulder flexion, horizontal abduction and dead but . same exercises over half form roller       Manual Therapy   Manual Therapy Edema management;Myofascial release;Manual Lymphatic Drainage (MLD)    Edema Management pt agreed to get compression sleeve from [facility]     Myofascial Release to right axilla     Manual Lymphatic Drainage (MLD) short neck , superficial and deep abdominals, right inguinal nodes, right axillary inguinal anastamosis,  right abdomen below the breast,  left axillary nodes anterior interaxillary anastamosis  then to sidelyin for more work on lateral anastamosis and back for postrerior anastamosis, right upper arm to axilla and repeated trunk     Passive ROM In supine to Rt shoulder into flexion, abduction and D2 incorporating MFR, pt with some improved end motion by end of session  PT Long Term Goals - 09/22/20 1716      PT LONG TERM GOAL #1   Title Pt will have 150 degrees of right shoulder abduction with no pulling from axillary cording.    Time 4    Period Weeks    Status On-going      PT LONG TERM GOAL #2   Title Pt will know how to manage the fullness and pain from cording in her right axilla with MLD, compression and exercise    Period Weeks    Status On-going                 Plan - 10/07/20 1014    Clinical Impression Statement Pt is improving in her range of motion but continues ot have cording evident in axilla to forearm.  The fullness and firmness in her axilla is decreased by more than 50% Pt agrees to get a compression sleeve from [facility] and will be measured on Monday    Personal Factors and Comorbidities Comorbidity 3+    Comorbidities  previous lumpectomy and 4 nodes removed, Ongoing chemotherapy    Stability/Clinical Decision Making Stable/Uncomplicated    Rehab Potential Excellent    PT Frequency 2x / week    PT Duration 4 weeks    PT Next Visit Plan Continue MLD  and MFR  to cording in axilla, neural glide exercise   PROM and soft tissue work    PT Home Exercise Plan Post op shoulder ROM HEP and closed chain flexion and abduction; end ROM stretching in doorway with self MFR to cording    Recommended Other Services demographics sent to [employer] and Agree with Plan of Care Patient    Family Member Consulted husband           Patient will benefit from skilled therapeutic intervention in order to improve the following deficits and impairments:  Postural dysfunction, Decreased knowledge of precautions, Impaired UE functional use, Pain, Decreased range of motion  Visit Diagnosis: Aftercare following surgery for neoplasm  Pain in right arm  Stiffness of right shoulder joint     Problem List Patient Active Problem List   Diagnosis Date Noted  . Family history of ovarian cancer 07/08/2020  . Family history of breast cancer 07/08/2020  . Malignant neoplasm of overlapping sites of right breast in female, estrogen receptor positive ([place]) 07/01/2020  . Murmur 03/09/2014  . Bradycardia 03/09/2014  . Genital herpes    Donato Heinz. Owens Shark PT  Norwood Levo 10/07/2020, 10:25 AM  [place], [place], [address] Phone: [phone]   Fax:  [phone]  Name: [name] [name] MRN: [id number] Date of Birth: 1971/10/23

## 2020-10-11 ENCOUNTER — Ambulatory Visit: Payer: 59 | Admitting: Physical Therapy

## 2020-10-11 ENCOUNTER — Encounter: Payer: Self-pay | Admitting: Physical Therapy

## 2020-10-11 ENCOUNTER — Other Ambulatory Visit: Payer: Self-pay

## 2020-10-11 DIAGNOSIS — Z483 Aftercare following surgery for neoplasm: Secondary | ICD-10-CM

## 2020-10-11 DIAGNOSIS — M79601 Pain in right arm: Secondary | ICD-10-CM

## 2020-10-11 DIAGNOSIS — M25611 Stiffness of right shoulder, not elsewhere classified: Secondary | ICD-10-CM

## 2020-10-11 NOTE — Therapy (Addendum)
[place], [place], [address] Phone: [phone]   Fax:  [phone]  Physical Therapy Treatment  Patient Details  Name: [name] [name] MRN: [id number] Date of Birth: 1971/06/28 Referring Provider (PT): Dr. Autumn Messing   Encounter Date: 10/11/2020   PT End of Session - 10/11/20 1706    Visit Number 7    Number of Visits 9    Date for PT Re-Evaluation 10/20/20    PT Start Time 1500    PT Stop Time 1545    PT Time Calculation (min) 45 min    Activity Tolerance Patient tolerated treatment well    Behavior During Therapy [facility] for tasks assessed/performed           Past Medical History:  Diagnosis Date  . Asthma   . Cancer ([place])   . Family history of breast cancer 07/08/2020  . Family history of ovarian cancer 07/08/2020  . Genital herpes     Past Surgical History:  Procedure Laterality Date  . BREAST LUMPECTOMY WITH RADIOACTIVE SEED AND SENTINEL LYMPH NODE BIOPSY Right 07/28/2020   Procedure: RIGHT BREAST LUMPECTOMY X 3  WITH RADIOACTIVE SEED AND SENTINEL LYMPH NODE BIOPSY;  Surgeon: Jovita Kussmaul, MD;  Location: [facility];  Service: General;  Laterality: Right;  PEC BLOCK  . FOOT SURGERY Right   . MOUTH SURGERY Right     There were no vitals filed for this visit.   Subjective Assessment - 10/11/20 1506    Subjective Pt says that she is feeling the pulling down into her wrist today.  It is uncomfortale. It has never been been this far before.  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 It is ER/PR positive and HER2 negative with a Ki67 of 2%. Patient reports she underwent a right lumpectomy and sentinel node biopsy (4 negative nodes) on 07/28/2020. She developed a seroma in her right axilla that has been drained and now has cording in her right axilla. She is  undergoing chemotherapy and is not sure if she will have radiation.    Patient Stated Goals to get rid of  the pulling in her arm and make sure it is ok    Currently in Pain? Yes   discomfort   Pain Score 7    wtih stretch                            [facility] Adult PT Treatment/Exercise - 10/11/20 0001      Shoulder Exercises: Pulleys   Flexion 2 minutes   with breath at the top    ABduction 2 minutes      Shoulder Exercises: Therapy Ball   Flexion Both;5 reps      Shoulder Exercises: ROM/Strengthening   Other ROM/Strengthening Exercises modified downward dog stretch on the wall       Modalities   Modalities Moist Heat      Moist Heat Therapy   Number Minutes Moist Heat 10 Minutes    Moist Heat Location --   to right arm at areas of tightness with close monitoring      Manual Therapy   Myofascial Release to right axilla     Manual Lymphatic Drainage (MLD) short neck , superficial and deep abdominals, right inguinal nodes, right axillary inguinal anastamosis,  right abdomen below  the breast,  left axillary nodes anterior interaxillary anastamosis  then to sidelyin for more work on lateral anastamosis and back for postrerior anastamosis, right upper arm to axilla and repeated trunk    less cording in forearm after gentle heat   Passive ROM In supine to Rt shoulder into flexion, abduction and D2 incorporating MFR, pt with some improved end motion by end of session                  PT Education - 10/11/20 1705    Education Details OK to use heating pad on low setting for no more than 7 minutes to tight areas of cording in right lower arm  monitoring skin and for swelling    Person(s) Educated Patient    Methods Explanation    Comprehension Verbalized understanding               PT Long Term Goals - 09/22/20 1716      PT LONG TERM GOAL #1   Title Pt will have 150 degrees of right shoulder abduction with no pulling from axillary cording.    Time 4     Period Weeks    Status On-going      PT LONG TERM GOAL #2   Title Pt will know how to manage the fullness and pain from cording in her right axilla with MLD, compression and exercise    Period Weeks    Status On-going                 Plan - 10/11/20 1706    Clinical Impression Statement Pt got good relief from discomfort and pulling into lower arm with brief gently moist heat, manual work and exercise today    Personal Factors and Comorbidities Comorbidity 3+    Comorbidities previous lumpectomy and 4 nodes removed, Ongoing chemotherapy    Stability/Clinical Decision Making Stable/Uncomplicated    Rehab Potential Excellent    PT Frequency 2x / week    PT Duration 4 weeks    PT Treatment/Interventions ADLs/Self Care Home Management;Therapeutic exercise;Patient/family education;Manual techniques;Manual lymph drainage;Compression bandaging;Scar mobilization;Passive range of motion    PT Next Visit Plan Continue MLD  and MFR  to cording in axilla, neural glide exercise   PROM and soft tissue work use moist heat carefully to help with cording    PT Home Exercise Plan Post op shoulder ROM HEP and closed chain flexion and abduction; end ROM stretching in doorway with self MFR to cording    Consulted and Agree with Plan of Care Patient           Patient will benefit from skilled therapeutic intervention in order to improve the following deficits and impairments:  Postural dysfunction, Decreased knowledge of precautions, Impaired UE functional use, Pain, Decreased range of motion  Visit Diagnosis: Aftercare following surgery for neoplasm  Pain in right arm  Stiffness of right shoulder joint     Problem List Patient Active Problem List   Diagnosis Date Noted  . Family history of ovarian cancer 07/08/2020  . Family history of breast cancer 07/08/2020  . Malignant neoplasm of overlapping sites of right breast in female, estrogen receptor positive ([place]) 07/01/2020  . Murmur  03/09/2014  . Bradycardia 03/09/2014  . Genital herpes    Donato Heinz. Owens Shark PT  Norwood Levo 10/11/2020, 5:18 PM  [place], [place], [address] Phone: [phone]   Fax:  [phone]  Name: [name] [name]  MRN: [id number] Date of Birth: 05/17/1971

## 2020-10-18 ENCOUNTER — Ambulatory Visit: Payer: 59 | Admitting: Physical Therapy

## 2020-10-19 ENCOUNTER — Encounter: Payer: Self-pay | Admitting: General Practice

## 2020-10-19 NOTE — Progress Notes (Signed)
[place] Spiritual Care Note  Spoke with Jeanett Schlein by phone, bearing witness to how she has approached her healing process and what she has learned about herself through this extended period of being out of work during treatment. She notes that one significant realization is just how valuable people are as themselves--not because of the work they do. We plan to follow up in a couple of weeks (before the holidays and going back to work) for another Therapist, occupational.   [place], [place], [facility] Pager [phone] Voicemail [phone]

## 2020-10-20 ENCOUNTER — Ambulatory Visit: Payer: 59 | Attending: General Surgery | Admitting: Physical Therapy

## 2020-10-20 ENCOUNTER — Other Ambulatory Visit (HOSPITAL_COMMUNITY): Payer: Self-pay | Admitting: General Surgery

## 2020-10-20 ENCOUNTER — Other Ambulatory Visit: Payer: Self-pay

## 2020-10-20 ENCOUNTER — Encounter: Payer: Self-pay | Admitting: Physical Therapy

## 2020-10-20 DIAGNOSIS — I890 Lymphedema, not elsewhere classified: Secondary | ICD-10-CM | POA: Diagnosis present

## 2020-10-20 DIAGNOSIS — M25611 Stiffness of right shoulder, not elsewhere classified: Secondary | ICD-10-CM | POA: Diagnosis present

## 2020-10-20 DIAGNOSIS — Z170 Estrogen receptor positive status [ER+]: Secondary | ICD-10-CM | POA: Diagnosis present

## 2020-10-20 DIAGNOSIS — C50511 Malignant neoplasm of lower-outer quadrant of right female breast: Secondary | ICD-10-CM | POA: Diagnosis present

## 2020-10-20 DIAGNOSIS — Z483 Aftercare following surgery for neoplasm: Secondary | ICD-10-CM | POA: Insufficient documentation

## 2020-10-20 DIAGNOSIS — M79601 Pain in right arm: Secondary | ICD-10-CM

## 2020-10-20 DIAGNOSIS — M7989 Other specified soft tissue disorders: Secondary | ICD-10-CM

## 2020-10-20 NOTE — Therapy (Signed)
[place], [place], [address] Phone: [phone]   Fax:  [phone]  Physical Therapy Treatment  Patient Details  Name: [name] [name] MRN: [id number] Date of Birth: May 23, 1971 Referring Provider (PT): Dr. Autumn Messing   Encounter Date: 10/20/2020   PT End of Session - 10/20/20 1442    Visit Number 8    Number of Visits 9    Date for PT Re-Evaluation 10/20/20    PT Start Time 4854    PT Stop Time 1430    PT Time Calculation (min) 25 min           Past Medical History:  Diagnosis Date  . Asthma   . Cancer ([place])   . Family history of breast cancer 07/08/2020  . Family history of ovarian cancer 07/08/2020  . Genital herpes     Past Surgical History:  Procedure Laterality Date  . BREAST LUMPECTOMY WITH RADIOACTIVE SEED AND SENTINEL LYMPH NODE BIOPSY Right 07/28/2020   Procedure: RIGHT BREAST LUMPECTOMY X 3  WITH RADIOACTIVE SEED AND SENTINEL LYMPH NODE BIOPSY;  Surgeon: Jovita Kussmaul, MD;  Location: [facility];  Service: General;  Laterality: Right;  PEC BLOCK  . FOOT SURGERY Right   . MOUTH SURGERY Right     There were no vitals filed for this visit.   Subjective Assessment - 10/20/20 1440    Subjective Pt says that she is not feeling the pulling down into her forearm today but she has firmness, redness and warmth in her upper arm.    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 It is ER/PR positive and HER2 negative with a Ki67 of 2%. Patient reports she underwent a right lumpectomy and sentinel node biopsy (4 negative nodes) on 07/28/2020. She developed a seroma in her right axilla that has been drained and now has cording in her right axilla. She is undergoing chemotherapy and is not sure if she will have radiation.    Patient Stated Goals to get rid of  the pulling in her arm and make sure it is ok    Currently in Pain? Yes     Pain Score --   did not rate                            [facility] Adult PT Treatment/Exercise - 10/20/20 0001      Self-Care   Self-Care Other Self-Care Comments    Other Self-Care Comments  assessed arm, discussed concerns with patinet, asked [name] [name], PT to assess pt also, called Dr. office to initate further evaluation of upper arm       Manual Therapy   Manual therapy comments measured left arm for prophylactic sleeve and feel that pt needs a size II                       PT Long Term Goals - 09/22/20 1716      PT LONG TERM GOAL #1   Title Pt will have 150 degrees of right shoulder abduction with no pulling from axillary cording.    Time 4    Period Weeks    Status On-going      PT LONG TERM GOAL #2   Title Pt will know how to manage the fullness and pain from cording in her right axilla with MLD, compression and exercise  Period Weeks    Status On-going                 Plan - 10/20/20 1442    Clinical Impression Statement Pt with concerning warmness, tenderness and firnmess in right upper arm. Phone call to [facility] and they will contact pt to have a doppler of right arm and follow up with them. Measured left arm for prophylactic sleeve that was not issued today. Will issue when  right upper arm symptoms resolve    Comorbidities previous lumpectomy and 4 nodes removed, Ongoing chemotherapy    Stability/Clinical Decision Making Stable/Uncomplicated    Rehab Potential Excellent    PT Frequency 2x / week    PT Duration 4 weeks    PT Treatment/Interventions ADLs/Self Care Home Management;Therapeutic exercise;Patient/family education;Manual techniques;Manual lymph drainage;Compression bandaging;Scar mobilization;Passive range of motion    PT Next Visit Plan check to see results from doppler, reassess goals and recert for more treatment.  Issue prophylactic sleeve and gauntlet from Ldex supply Continue MLD  and MFR  to cording in axilla, neural  glide exercise   PROM and soft tissue work use moist heat carefully to help with cording    Consulted and Agree with Plan of Care Patient           Patient will benefit from skilled therapeutic intervention in order to improve the following deficits and impairments:  Postural dysfunction, Decreased knowledge of precautions, Impaired UE functional use, Pain, Decreased range of motion  Visit Diagnosis: Aftercare following surgery for neoplasm  Pain in right arm  Stiffness of right shoulder joint     Problem List Patient Active Problem List   Diagnosis Date Noted  . Family history of ovarian cancer 07/08/2020  . Family history of breast cancer 07/08/2020  . Malignant neoplasm of overlapping sites of right breast in female, estrogen receptor positive ([place]) 07/01/2020  . Murmur 03/09/2014  . Bradycardia 03/09/2014  . Genital herpes     [name] [name]. Owens Shark PT  [name] [name] 10/20/2020, 3:02 PM  [place], [place], [address] Phone: [phone]   Fax:  [phone]  Name: [name] [name] MRN: [id number] Date of Birth: 04/18/1971

## 2020-10-21 ENCOUNTER — Encounter: Payer: Self-pay | Admitting: *Deleted

## 2020-10-21 ENCOUNTER — Ambulatory Visit (HOSPITAL_COMMUNITY)
Admission: RE | Admit: 2020-10-21 | Discharge: 2020-10-21 | Disposition: A | Discharge status: Home / Self Care | Payer: 59 | Source: Ambulatory Visit | Attending: General Surgery | Admitting: General Surgery

## 2020-10-21 DIAGNOSIS — M79601 Pain in right arm: Secondary | ICD-10-CM | POA: Diagnosis not present

## 2020-10-21 DIAGNOSIS — M7989 Other specified soft tissue disorders: Secondary | ICD-10-CM | POA: Diagnosis not present

## 2020-10-25 ENCOUNTER — Ambulatory Visit: Payer: 59 | Admitting: Plastic Surgery

## 2020-10-31 ENCOUNTER — Other Ambulatory Visit: Payer: Self-pay

## 2020-10-31 ENCOUNTER — Ambulatory Visit: Payer: 59

## 2020-10-31 DIAGNOSIS — Z483 Aftercare following surgery for neoplasm: Secondary | ICD-10-CM

## 2020-10-31 NOTE — Therapy (Addendum)
[place], [place], [address] Phone: [phone]   Fax:  [phone]  Physical Therapy Treatment  Patient Details  Name: [name] [name] MRN: [id number] Date of Birth: 13-Jul-1971 Referring Provider (PT): Dr. Autumn Messing   Encounter Date: 10/31/2020   PT End of Session - 10/31/20 0843    Visit Number 8   # unchanged due to screen   Number of Visits 9    Date for PT Re-Evaluation 10/20/20    PT Start Time 0815    PT Stop Time 0258    PT Time Calculation (min) 29 min    Activity Tolerance Patient tolerated treatment well    Behavior During Therapy [facility] for tasks assessed/performed           Past Medical History:  Diagnosis Date  . Asthma   . Cancer ([place])   . Family history of breast cancer 07/08/2020  . Family history of ovarian cancer 07/08/2020  . Genital herpes     Past Surgical History:  Procedure Laterality Date  . BREAST LUMPECTOMY WITH RADIOACTIVE SEED AND SENTINEL LYMPH NODE BIOPSY Right 07/28/2020   Procedure: RIGHT BREAST LUMPECTOMY X 3  WITH RADIOACTIVE SEED AND SENTINEL LYMPH NODE BIOPSY;  Surgeon: Jovita Kussmaul, MD;  Location: [facility];  Service: General;  Laterality: Right;  PEC BLOCK  . FOOT SURGERY Right   . MOUTH SURGERY Right     There were no vitals filed for this visit.   Subjective Assessment - 10/31/20 0820    Subjective Pt returns for her 3 month L-dex screen.    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 It is ER/PR positive and HER2 negative with a Ki67 of 2%. Patient reports she underwent a right lumpectomy and sentinel node biopsy (4 negative nodes) on 07/28/2020. She developed a seroma in her right axilla that has been drained and now has cording in her right axilla. She is undergoing chemotherapy and is not sure if she will have radiation.                  L-DEX FLOWSHEETS - 10/31/20 0800       L-DEX LYMPHEDEMA SCREENING   Measurement Type Unilateral    L-DEX MEASUREMENT EXTREMITY Upper Extremity    POSITION  Standing    DOMINANT SIDE Right    At Risk Side Right    BASELINE SCORE (UNILATERAL) -0.2    L-DEX SCORE (UNILATERAL) 10.5    VALUE CHANGE (UNILAT) 10.7                                  PT Long Term Goals - 09/22/20 1716      PT LONG TERM GOAL #1   Title Pt will have 150 degrees of right shoulder abduction with no pulling from axillary cording.    Time 4    Period Weeks    Status On-going      PT LONG TERM GOAL #2   Title Pt will know how to manage the fullness and pain from cording in her right axilla with MLD, compression and exercise    Period Weeks    Status On-going                 Plan - 10/31/20 0844    Clinical Impression Statement Pt returns for 3 month L-Dex screen. Her change  from baseline of 10.7 is not WNLs so measured pt for and issued a size 1 (caramel color) 20-30 mmHg compression sleeve. Instructed her to wear this 10 hrs/day for next 4 weeks and schedule appt to return for remeasure of her L-Dex score. Pt agreeable to all. All her questions were answered regarding this including ok to exercise as long as she is wearing sleeve and conts with "low and slow" progression she has been instructed in during ABC class. Pt verbalizes understanding all and has this therapists email if she has any other questions.    PT Next Visit Plan Pt to return in 4 weeks for L-Dex screen to reassess change from baseline after wearing compression sleeve daily x4 weeks.    Consulted and Agree with Plan of Care Patient           Patient will benefit from skilled therapeutic intervention in order to improve the following deficits and impairments:     Visit Diagnosis: Aftercare following surgery for neoplasm     Problem List Patient Active Problem List   Diagnosis Date Noted  . Family history of ovarian cancer 07/08/2020  . Family  history of breast cancer 07/08/2020  . Malignant neoplasm of overlapping sites of right breast in female, estrogen receptor positive ([place]) 07/01/2020  . Murmur 03/09/2014  . Bradycardia 03/09/2014  . Genital herpes     [name], PTA 10/31/2020, 4:50 PM  [place], [place], [address] Phone: [phone]   Fax:  [phone]  Name: Jerelyn Trimarco MRN: [id number] Date of Birth: 31-Jul-1971

## 2020-11-02 ENCOUNTER — Encounter: Payer: Self-pay | Admitting: General Practice

## 2020-11-02 ENCOUNTER — Ambulatory Visit: Payer: 59 | Admitting: Physical Therapy

## 2020-11-02 ENCOUNTER — Other Ambulatory Visit: Payer: Self-pay

## 2020-11-02 DIAGNOSIS — Z483 Aftercare following surgery for neoplasm: Secondary | ICD-10-CM

## 2020-11-02 DIAGNOSIS — M79601 Pain in right arm: Secondary | ICD-10-CM

## 2020-11-02 DIAGNOSIS — M25611 Stiffness of right shoulder, not elsewhere classified: Secondary | ICD-10-CM

## 2020-11-02 DIAGNOSIS — I890 Lymphedema, not elsewhere classified: Secondary | ICD-10-CM

## 2020-11-02 NOTE — Progress Notes (Signed)
[facility] Spiritual Care Note  [name] [name] by phone for follow-up support as planned. She is so appreciative of pastoral check-in calls: "Please don't stop!" She makes good use of the opportunity each time. Today she is excited about returning to work in the new year (despite covid apprehensions) and eager to "put 2021 in the rear view" in order to have a fresh start with new opportunities and insights. [name] [name] is currently reflecting on what and how much she will want to share about her cancer experience with various people (clients and others), balancing her desire for privacy with her desire to be helpful to and supportive of others who may benefit from hearing some of her story. We plan to follow up by phone in the new year.   [place], [place], [facility] Pager [phone] Voicemail [phone]

## 2020-11-02 NOTE — Patient Instructions (Signed)
First of all, check with your insurance company to see if provider is in [place] (for wigs and compression sleeves / gloves/gauntlets )  [place], [place] [address] [phone]  Will file some insurances --- call for appointment   Second to [facility] (for mastectomy prosthetics and garments) [place], [place] [address] [phone] Will file some insurances --- call for appointment  [facility]  [address]  [place], [place] [address] [phone] Lower extremity garments  [facility] [place], [place]  [address] [place] and Prosthetics (for compression garments, especilly for lower extremities) [address], [place], [place]  [address] [phone] Call for appointment    Jerrol Banana ,certified fitter [place]  [phone]  [employer] (for mastectomy supplies and garments) [place] [place], [place] [address] [phone]  Other Resources: National Lymphedema Network:  www.lymphnet.org www.Klosetraining.com for patient articles and self manual lymph drainage information www.lymphedemablog.com has informative articles.  DishTag.[email or web address].com www.lymphedemaproducts.com Www.brightlifedirect.com    Access Code: [email or web address] URL: [email or web address] Date: 11/02/2020 Prepared by: Maudry Diego  Exercises Sidelying Thoracic Rotation with Open Book - 1 x daily - 7 x weekly - 3 sets - 10 reps Supine Chest Stretch with Elbows Bent - 1 x daily - 7 x weekly - 3 sets - 10 reps Standing Median Nerve Glide - 1 x daily - 7 x weekly - 3 sets - 10 reps Standing Ulnar Nerve Glide - 1 x daily - 7 x weekly - 3 reps Standing Radial Nerve Glide - 1 x daily - 7 x weekly - 3 sets - 10 reps Single Arm Doorway Pec Stretch at 90 Degrees Abduction - 1 x daily - 7 x weekly - 3 sets - 10  reps

## 2020-11-02 NOTE — Therapy (Signed)
[place], [place], [address] Phone: [phone]   Fax:  [phone]  Physical Therapy Treatment  Patient Details  Name: [name] [name] MRN: [id number] Date of Birth: [birth year] Referring Provider (PT): Dr. Autumn Messing   Encounter Date: 11/02/2020    Past Medical History:  Diagnosis Date  . Asthma   . Cancer ([place])   . Family history of breast cancer 07/08/2020  . Family history of ovarian cancer 07/08/2020  . Genital herpes     Past Surgical History:  Procedure Laterality Date  . BREAST LUMPECTOMY WITH RADIOACTIVE SEED AND SENTINEL LYMPH NODE BIOPSY Right 07/28/2020   Procedure: RIGHT BREAST LUMPECTOMY X 3  WITH RADIOACTIVE SEED AND SENTINEL LYMPH NODE BIOPSY;  Surgeon: Jovita Kussmaul, MD;  Location: [facility];  Service: General;  Laterality: Right;  PEC BLOCK  . FOOT SURGERY Right   . MOUTH SURGERY Right     There were no vitals filed for this visit.   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 It is ER/PR positive and HER2 negative with a Ki67 of 2%. Patient reports she underwent a right lumpectomy and sentinel node biopsy (4 negative nodes) on 07/28/2020. She developed a seroma in her right axilla that has been drained and now has cording in her right axilla. She is undergoing chemotherapy and is not sure if she will have radiation.                                               PT Long Term Goals - 09/22/20 1716      PT LONG TERM GOAL #1   Title Pt will have 150 degrees of right shoulder abduction with no pulling from axillary cording.    Time 4    Period Weeks    Status On-going      PT LONG TERM GOAL #2   Title Pt will know how to manage the fullness and pain from cording in her right axilla with MLD, compression and exercise    Period Weeks     Status On-going                  Patient will benefit from skilled therapeutic intervention in order to improve the following deficits and impairments:     Visit Diagnosis: No diagnosis found.     Problem List Patient Active Problem List   Diagnosis Date Noted  . Family history of ovarian cancer 07/08/2020  . Family history of breast cancer 07/08/2020  . Malignant neoplasm of overlapping sites of right breast in female, estrogen receptor positive ([place]) 07/01/2020  . Murmur 03/09/2014  . Bradycardia 03/09/2014  . Genital herpes     Norwood Levo 11/02/2020, 3:33 PM  [place], [place], [address] Phone: [phone]   Fax:  [phone]  Name: [name] [name] MRN: [id number] Date of Birth: December 13, 1970

## 2020-11-02 NOTE — Therapy (Signed)
[place], [place], [address] Phone: [phone]   Fax:  [phone]  Physical Therapy Treatment  Patient Details  Name: [name] [name] MRN: [id number] Date of Birth: 24-Dec-1970 Referring Provider (PT): Dr. Autumn [name]   Encounter Date: 11/02/2020   PT End of Session - 11/02/20 1551    Visit Number 9    Number of Visits 25    Date for PT Re-Evaluation 01/03/21    PT Start Time 1200    PT Stop Time 1300    PT Time Calculation (min) 60 min    Activity Tolerance Patient tolerated treatment well    Behavior During Therapy [facility] for tasks assessed/performed           Past Medical History:  Diagnosis Date  . Asthma   . Cancer ([place])   . Family history of breast cancer 07/08/2020  . Family history of ovarian cancer 07/08/2020  . Genital herpes     Past Surgical History:  Procedure Laterality Date  . BREAST LUMPECTOMY WITH RADIOACTIVE SEED AND SENTINEL LYMPH NODE BIOPSY Right 07/28/2020   Procedure: RIGHT BREAST LUMPECTOMY X 3  WITH RADIOACTIVE SEED AND SENTINEL LYMPH NODE BIOPSY;  Surgeon: [name] Kussmaul, MD;  Location: [facility];  Service: General;  Laterality: Right;  PEC BLOCK  . FOOT SURGERY Right   . MOUTH SURGERY Right     There were no vitals filed for this visit.   Subjective Assessment - 11/02/20 1534    Subjective Pt went for the doppler that was negative.  She did see Dr. Marlou [name] and she was put on an antibiotic.  She feels that her arm is better, is not red or hard any more. She also thinks the cording is a little better and she does not feel it down into her wrist but she still has it to her elbow.  She has an elevation in you L-dex on the SOZO screen and so is wearing a compression sleeve for 10 hours every day.    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 It is ER/PR positive and HER2 negative with a Ki67 of 2%. Patient  reports she underwent a right lumpectomy and sentinel node biopsy (4 negative nodes) on 07/28/2020. She developed a seroma in her right axilla that has been drained and now has cording in her right axilla. She is undergoing chemotherapy and is not sure if she will have radiation.    Patient Stated Goals to get rid of  the pulling in her arm and make sure it is ok    Currently in Pain? No/denies                  [facility] PT Assessment - 11/02/20 0001      Assessment   Referring Provider (PT) Dr. Autumn [name]                         [facility] Adult PT Treatment/Exercise - 11/02/20 0001      Exercises   Exercises Shoulder;Other Exercises    Other Exercises  Pt continues to go the gym, doing 3 sets of 30 shoulder exercises with 5 pounds in addition to leg work.Encouraged her not to increase reps or weights at this time and continue to wear her compression while exercising      Shoulder Exercises: Stretch   Other Shoulder Stretches Medbridge program for pec stretches and  nerve glides      Manual Therapy   Manual Therapy Manual Lymphatic Drainage (MLD);Soft tissue mobilization;Edema management    Manual therapy comments Visible and palpable cording at right antecubital fossa with less at axilla.Seroma at axilla much softer and reduced    Edema Management Pt comes in wearing her compression sleeve that appears to fit her well. She appears to have mild fullness in dorsal aspect of hand at thenar eminence so she was given directions to get a gauntlet from [employer] to add while she is wearing her sleeve.    Manual Lymphatic Drainage (MLD) short neck , superficial and deep abdominals, right inguinal nodes, right axillary inguinal anastamosis,  right abdomen below the breast,  left axillary nodes anterior interaxillary anastamosis  then to sid[place]in for more work on lateral anastamosis and back for postrerior anastamosis, right upper arm to axilla and repeated trunk                    PT Education - 11/02/20 1548    Education Details nerve glides and pec stretches    Person(s) Educated Patient    Methods Explanation;Demonstration;Handout;Other (comment)   Medbridge   Comprehension Verbalized understanding               PT Long Term Goals - 11/02/20 1558      PT LONG TERM GOAL #1   Title Pt will have 150 degrees of right shoulder abduction with no pulling from axillary cording.    Time 8    Period Weeks    Status On-going      PT LONG TERM GOAL #2   Title Pt will know how to manage the fullness and pain from cording in her right axilla with MLD, compression and exercise    Time 8    Period Weeks    Status On-going      PT LONG TERM GOAL #3   Title Pt will have return of L-Dex score to baseling warranting no further treatment for lymphedema    Period Weeks    Status New                 Plan - 11/02/20 1552    Clinical Impression Statement [name] [name] continues to have visible and palpable in her antecubital fossa and axilla with some tissue disortion in forearm and upper arm though she does not feel the pulling as much in those areas. Her breast and axillary seroma are much improved and responding to compression.  She has a detectable change on her L-dex sore so is wearing a compression sleeve and a gauntlet is recommended for possible fullness in right thenar web space.  She was istructed in more stretching exercises and nerve glides today Renewal sent for 8 more weeks to continue to work on decreasing cording and fullness in arm    Personal Factors and Comorbidities Comorbidity 3+    Comorbidities previous lumpectomy and 4 nodes removed, Ongoing chemotherapy    Stability/Clinical Decision Making Stable/Uncomplicated    Rehab Potential Excellent    PT Frequency 2x / week    PT Duration 8 weeks    PT Treatment/Interventions ADLs/Self Care Home Management;Therapeutic exercise;Patient/family education;Manual techniques;Manual lymph drainage;Compression  bandaging;Scar mobilization;Passive range of motion    PT Next Visit Plan Continue with manual work and exercise to decrease cording Pt to return in 4 weeks for L-Dex screen to reassess change from baseline after wearing compression sleeve daily x4 weeks.    PT Home Exercise  Plan Post op shoulder ROM HEP and closed chain flexion and abduction; end ROM stretching in doorway with self MFR to cording    Consulted and Agree with Plan of Care Patient    Family Member Consulted husband           Patient will benefit from skilled therapeutic intervention in order to improve the following deficits and impairments:     Visit Diagnosis: Aftercare following surgery for neoplasm - Plan: PT plan of care cert/re-cert  Pain in right arm - Plan: PT plan of care cert/re-cert  Stiffness of right shoulder joint - Plan: PT plan of care cert/re-cert  Lymphedema, not elsewhere classified - Plan: PT plan of care cert/re-cert     Problem List Patient Active Problem List   Diagnosis Date Noted  . Family history of ovarian cancer 07/08/2020  . Family history of breast cancer 07/08/2020  . Malignant neoplasm of overlapping sites of right breast in female, estrogen receptor positive ([place]) 07/01/2020  . Murmur 03/09/2014  . Bradycardia 03/09/2014  . Genital herpes    Donato Heinz. Owens Shark PT  [place] [facility] 11/02/2020, [place], [place], [address] Phone: [phone]   Fax:  [phone]  Name: [name] [name] MRN: [id number] Date of Birth: 23-Feb-1971

## 2020-11-07 ENCOUNTER — Other Ambulatory Visit: Payer: Self-pay | Admitting: Oncology

## 2020-11-08 ENCOUNTER — Encounter: Payer: Self-pay | Admitting: *Deleted

## 2020-11-08 ENCOUNTER — Other Ambulatory Visit: Payer: Self-pay

## 2020-11-08 ENCOUNTER — Telehealth: Payer: Self-pay | Admitting: *Deleted

## 2020-11-08 ENCOUNTER — Ambulatory Visit: Payer: 59

## 2020-11-08 DIAGNOSIS — C50511 Malignant neoplasm of lower-outer quadrant of right female breast: Secondary | ICD-10-CM

## 2020-11-08 DIAGNOSIS — M79601 Pain in right arm: Secondary | ICD-10-CM

## 2020-11-08 DIAGNOSIS — Z483 Aftercare following surgery for neoplasm: Secondary | ICD-10-CM

## 2020-11-08 DIAGNOSIS — I890 Lymphedema, not elsewhere classified: Secondary | ICD-10-CM

## 2020-11-08 DIAGNOSIS — C50811 Malignant neoplasm of overlapping sites of right female breast: Secondary | ICD-10-CM

## 2020-11-08 DIAGNOSIS — Z170 Estrogen receptor positive status [ER+]: Secondary | ICD-10-CM

## 2020-11-08 DIAGNOSIS — M25611 Stiffness of right shoulder, not elsewhere classified: Secondary | ICD-10-CM

## 2020-11-08 NOTE — Therapy (Signed)
[facility] [address] [place], [place], [address] Phone: [phone]   Fax:  [phone]  Physical Therapy Treatment  Patient Details  Name: [name] [name] MRN: [id number] Date of Birth: 02/03/1971 Referring Provider (PT): Dr. Paul Toth   Encounter Date: 11/08/2020   PT End of Session - 11/08/20 1336    Visit Number 10    Number of Visits 25    Date for PT Re-Evaluation 01/03/21    PT Start Time 0901    PT Stop Time 1006    PT Time Calculation (min) 65 min    Activity Tolerance Patient tolerated treatment well    Behavior During Therapy WFL for tasks assessed/performed           Past Medical History:  Diagnosis Date  . Asthma   . Cancer ([facility])   . Family history of breast cancer 07/08/2020  . Family history of ovarian cancer 07/08/2020  . Genital herpes     Past Surgical History:  Procedure Laterality Date  . BREAST LUMPECTOMY WITH RADIOACTIVE SEED AND SENTINEL LYMPH NODE BIOPSY Right 07/28/2020   Procedure: RIGHT BREAST LUMPECTOMY X 3  WITH RADIOACTIVE SEED AND SENTINEL LYMPH NODE BIOPSY;  Surgeon: Toth, Paul III, MD;  Location: [facility];  Service: General;  Laterality: Right;  PEC BLOCK  . FOOT SURGERY Right   . MOUTH SURGERY Right     There were no vitals filed for this visit.   Subjective Assessment - 11/08/20 0907    Subjective I think that my cording is getting better. I've been using the chip pack in my Rt axilla and wearing my compression sleeve and since then I've noticed the cording in my axilla is less restriting to my end ROM reaching. It's most limiting now where it's still visible at my elbow (antecubital fossa). I haven't gotten by to [facility] yet for the ganutlet but my hand swelling has gone away in the past few days.    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 It is ER/PR positive and HER2 negative with a Ki67 of 2%.  Patient reports she underwent a right lumpectomy and sentinel node biopsy (4 negative nodes) on 07/28/2020. She developed a seroma in her right axilla that has been drained and now has cording in her right axilla. She is undergoing chemotherapy and is not sure if she will have radiation.    Patient Stated Goals to get rid of  the pulling in her arm and make sure it is ok    Currently in Pain? No/denies                             [facility] Adult PT Treatment/Exercise - 11/08/20 0001      Manual Therapy   Soft tissue mobilization --    Myofascial Release To Rt UE focusing at visible cording at antecubital fossa and into forearm    Manual Lymphatic Drainage (MLD) short neck , superficial and deep abdominals instructing pt in correct diaphragmatic breaths, right inguinal nodes, right axillary-inguinal anastamosis, left axillary nodes, anterior inter-axillary anastamosis beginning to instruct pt while performing thn having her return demo of each sequence.    Passive ROM In supine to Rt shoulder into flexion, abduction and D2 incorporating MFR, pt conts with improved end motion in all directions by end of session                    PT Education - 11/08/20 0919    Education Details Self MLD    Person(s) Educated Patient    Methods Explanation;Demonstration;Handout    Comprehension Verbalized understanding;Returned demonstration;Need further instruction               PT Long Term Goals - 11/02/20 1558      PT LONG TERM GOAL #1   Title Pt will have 150 degrees of right shoulder abduction with no pulling from axillary cording.    Time 8    Period Weeks    Status On-going      PT LONG TERM GOAL #2   Title Pt will know how to manage the fullness and pain from cording in her right axilla with MLD, compression and exercise    Time 8    Period Weeks    Status On-going      PT LONG TERM GOAL #3   Title Pt will have return of L-Dex score to baseling warranting no  further treatment for lymphedema    Period Weeks    Status New                 Plan - 11/08/20 1337    Clinical Impression Statement Pt returns to physical therapy reporting hre cording is seeming to improve some and her end A/ROM is also some improved with ADLs. She has yet to get measured for a new compression gauntlet due to new thenar swelling noted at last session however pt had noticed over past few days this seemed to have resolved. Educated her that okay to wait for now, especially as she was instructed in self manual lymph drainage today, but also cautioned her that if she notes hand swelling returning she needs to get compression for her hand ASAP. Pt verbalized understanding this. Focus today, along with MFR and P/ROM for cording, was to instruct pt in self MLD. She did very well with this returning good demo with light pressure after hand over hand cuing for correct pressure and skin stretch, not slide. Cording is still very visible and palpable at antecubital fossa, but less so now at axilla.    Personal Factors and Comorbidities Comorbidity 3+    Comorbidities previous lumpectomy and 4 nodes removed, Ongoing chemotherapy    Stability/Clinical Decision Making Stable/Uncomplicated    Rehab Potential Excellent    PT Frequency 2x / week    PT Duration 8 weeks    PT Treatment/Interventions ADLs/Self Care Home Management;Therapeutic exercise;Patient/family education;Manual techniques;Manual lymph drainage;Compression bandaging;Scar mobilization;Passive range of motion    PT Next Visit Plan Continue with manual work and exercise to decrease cording; progress to AA/ROM exs; Pt to return in 4 weeks for L-Dex screen to reassess change from baseline after wearing compression sleeve daily x4 weeks.    PT Home Exercise Plan Post op shoulder ROM HEP and closed chain flexion and abduction; end ROM stretching in doorway with self MFR to cording    Consulted and Agree with Plan of Care Patient            Patient will benefit from skilled therapeutic intervention in order to improve the following deficits and impairments:  Postural dysfunction,Decreased knowledge of precautions,Impaired UE functional use,Pain,Decreased range of motion  Visit Diagnosis: Aftercare following surgery for neoplasm  Pain in right arm  Stiffness of right shoulder joint  Lymphedema, not elsewhere classified  Malignant neoplasm of lower-outer quadrant of right breast of female, estrogen receptor positive ([place])     Problem List Patient Active  Problem List   Diagnosis Date Noted  . Family history of ovarian cancer 07/08/2020  . Family history of breast cancer 07/08/2020  . Malignant neoplasm of overlapping sites of right breast in female, estrogen receptor positive ([facility]) 07/01/2020  . Murmur 03/09/2014  . Bradycardia 03/09/2014  . Genital herpes     Rosenberger, Valerie Ann, PTA 11/08/2020, 1:43 PM  [facility] [address] [place], [place], [address] Phone: [phone]   Fax:  [phone]  Name: [name] Cumba MRN: [id number] Date of Birth: 05/02/1971   

## 2020-11-08 NOTE — Patient Instructions (Signed)
Start with circles near neck above collarbones 10 times each side.    Cancer Rehab [phone]  Deep Effective Breath   Standing, sitting, or laying down, place both hands on the belly. Take a deep breath IN, expanding the belly; then breath OUT, contracting the belly. Repeat __5__ times. Do __2-3__ sessions per day and before your self massage.   Axilla to Axilla - Sweep   On uninvolved side make 5 circles in the armpit, then pump _5__ times from involved armpit across chest to uninvolved armpit, making a pathway. Do _1__ time per day.  Copyright  VHI. All rights reserved.  Axilla to Inguinal Nodes - Sweep   On involved side, make 5 circles at groin at panty line, then pump _5__ times from armpit along side of trunk to outer hip, making your other pathway. Do __1_ time per day.  Copyright  VHI. All rights reserved.  Arm Posterior: Elbow to Shoulder - Sweep   Pump _5__ times from back of elbow to top of shoulder. Then inner to outer upper arm _5_ times, then outer arm again _5_ times. Then back to the pathways _2-3_ times. Do _1__ time per day.  Copyright  VHI. All rights reserved.  ARM: [place] Wrist to Elbow - Sweep   Pump or stationary circles _5__ times from wrist to elbow making sure to do both sides of the forearm. Then retrace your steps to the outer arm, and the pathways _2-3_ times each. Do _1__ time per day.  Copyright  VHI. All rights reserved.  ARM: Dorsum of Hand to Shoulder - Sweep   Pump or stationary circles _5__ times on back of hand including knuckle spaces and individual fingers if needed working up towards the wrist, then retrace all your steps working back up the forearm, doing both sides; upper outer arm and back to your pathways _2-3_ times each. Then do 5 circles again at uninvolved armpit and involved groin where you started! Good job!! Do __1_ time per day.  Copyright  VHI. All rights reserved.

## 2020-11-16 ENCOUNTER — Other Ambulatory Visit: Payer: Self-pay

## 2020-11-16 ENCOUNTER — Emergency Department (HOSPITAL_COMMUNITY)
Admission: EM | Admit: 2020-11-16 | Discharge: 2020-11-16 | Disposition: A | Discharge status: Home / Self Care | Payer: 59 | Attending: Emergency Medicine | Admitting: Emergency Medicine

## 2020-11-16 ENCOUNTER — Encounter (HOSPITAL_COMMUNITY): Payer: Self-pay

## 2020-11-16 ENCOUNTER — Emergency Department (HOSPITAL_COMMUNITY): Payer: 59

## 2020-11-16 DIAGNOSIS — L299 Pruritus, unspecified: Secondary | ICD-10-CM | POA: Diagnosis not present

## 2020-11-16 DIAGNOSIS — R0602 Shortness of breath: Secondary | ICD-10-CM

## 2020-11-16 DIAGNOSIS — Z20822 Contact with and (suspected) exposure to covid-19: Secondary | ICD-10-CM | POA: Diagnosis not present

## 2020-11-16 DIAGNOSIS — Z853 Personal history of malignant neoplasm of breast: Secondary | ICD-10-CM | POA: Diagnosis not present

## 2020-11-16 DIAGNOSIS — L509 Urticaria, unspecified: Secondary | ICD-10-CM | POA: Diagnosis not present

## 2020-11-16 DIAGNOSIS — R509 Fever, unspecified: Secondary | ICD-10-CM | POA: Diagnosis not present

## 2020-11-16 DIAGNOSIS — J45909 Unspecified asthma, uncomplicated: Secondary | ICD-10-CM | POA: Insufficient documentation

## 2020-11-16 DIAGNOSIS — R21 Rash and other nonspecific skin eruption: Secondary | ICD-10-CM | POA: Diagnosis present

## 2020-11-16 LAB — COMPREHENSIVE METABOLIC PANEL
ALT: 19 U/L (ref 0–44)
AST: 21 U/L (ref 15–41)
Albumin: 3.8 g/dL (ref 3.5–5.0)
Alkaline Phosphatase: 88 U/L (ref 38–126)
Anion gap: 8 (ref 5–15)
BUN: 14 mg/dL (ref 6–20)
CO2: 24 mmol/L (ref 22–32)
Calcium: 9.1 mg/dL (ref 8.9–10.3)
Chloride: 106 mmol/L (ref 98–111)
Creatinine, Ser: 1.05 mg/dL — ABNORMAL HIGH (ref 0.44–1.00)
GFR, Estimated: >60 mL/min (ref >60)
Glucose, Bld: 130 mg/dL — ABNORMAL HIGH (ref 70–99)
Potassium: 4.2 mmol/L (ref 3.5–5.1)
Sodium: 138 mmol/L (ref 135–145)
Total Bilirubin: 0.6 mg/dL (ref 0.3–1.2)
Total Protein: 7 g/dL (ref 6.5–8.1)

## 2020-11-16 LAB — CBC WITH DIFFERENTIAL/PLATELET
Abs Immature Granulocytes: 0.21 10*3/uL — ABNORMAL HIGH (ref 0.00–0.07)
Basophils Absolute: 0 10*3/uL (ref 0.0–0.1)
Basophils Relative: 0 %
Eosinophils Absolute: 0 10*3/uL (ref 0.0–0.5)
Eosinophils Relative: 0 %
HCT: 33.2 % — ABNORMAL LOW (ref 36.0–46.0)
Hemoglobin: 10.8 g/dL — ABNORMAL LOW (ref 12.0–15.0)
Immature Granulocytes: 2 %
Lymphocytes Relative: 4 %
Lymphs Abs: 0.5 10*3/uL — ABNORMAL LOW (ref 0.7–4.0)
MCH: 30.9 pg (ref 26.0–34.0)
MCHC: 32.5 g/dL (ref 30.0–36.0)
MCV: 94.9 fL (ref 80.0–100.0)
Monocytes Absolute: 0.3 10*3/uL (ref 0.1–1.0)
Monocytes Relative: 2 %
Neutro Abs: 11.9 10*3/uL — ABNORMAL HIGH (ref 1.7–7.7)
Neutrophils Relative %: 92 %
Platelets: 272 10*3/uL (ref 150–400)
RBC: 3.5 MIL/uL — ABNORMAL LOW (ref 3.87–5.11)
RDW: 14.9 % (ref 11.5–15.5)
WBC: 12.9 10*3/uL — ABNORMAL HIGH (ref 4.0–10.5)
nRBC: 0 % (ref 0.0–0.2)

## 2020-11-16 LAB — RESP PANEL BY RT-PCR (FLU A&B, COVID) ARPGX2
Influenza A by PCR: NEGATIVE
Influenza B by PCR: NEGATIVE
SARS Coronavirus 2 by RT PCR: NEGATIVE

## 2020-11-16 LAB — URINALYSIS, ROUTINE W REFLEX MICROSCOPIC
Bacteria, UA: NONE SEEN
Bilirubin Urine: NEGATIVE
Glucose, UA: NEGATIVE mg/dL
Hgb urine dipstick: NEGATIVE
Ketones, ur: NEGATIVE mg/dL
Nitrite: NEGATIVE
Protein, ur: NEGATIVE mg/dL
Specific Gravity, Urine: 1.013 (ref 1.005–1.030)
pH: 7 (ref 5.0–8.0)

## 2020-11-16 LAB — LACTIC ACID, PLASMA: Lactic Acid, Venous: 1.1 mmol/L (ref 0.5–1.9)

## 2020-11-16 MED ORDER — LEVOFLOXACIN 500 MG PO TABS: 500.0000 mg | ORAL_TABLET | Freq: Every day | ORAL | 0 refills | Status: DC

## 2020-11-16 MED ORDER — ACETAMINOPHEN 325 MG PO TABS
650.0000 mg | ORAL_TABLET | Freq: Once | ORAL | Status: AC
Start: 2020-11-16 — End: 2020-11-16
  Administered 2020-11-16: 650 mg via ORAL
  Filled 2020-11-16: qty 2

## 2020-11-16 MED ORDER — SODIUM CHLORIDE 0.9 % IV BOLUS
1000.0000 mL | Freq: Once | INTRAVENOUS | Status: AC
  Administered 2020-11-16: 1000 mL via INTRAVENOUS

## 2020-11-16 NOTE — Discharge Instructions (Signed)
Call your Oncologist tomorrow morning.  Return immediately back to the ER if:  Your symptoms worsen within the next 12-24 hours. You develop new symptoms such as new fevers, persistent vomiting, new pain, shortness of breath, or new weakness or numbness, or if you have any other concerns.

## 2020-11-16 NOTE — ED Notes (Signed)
An After Visit Summary was printed and given to the patient. Discharge instructions given and no further questions at this time.  

## 2020-11-16 NOTE — ED Triage Notes (Signed)
Pt reports she recently stopped chemo treatments, but was put on another medication for infection. Pt reports having a episode of SHOB a few days ago that went away, but then she developed a rash on her left forearm and then developed a fever today.

## 2020-11-16 NOTE — ED Provider Notes (Signed)
[facility] DEPT Provider Note   CSN: [id number] Arrival date & time: 11/16/20  1747     History Chief Complaint  Patient presents with  . Rash  . Fever    [name] [name] is a 49 y.o. female.  Patient presents with chief complaint of rash.  She has a history of breast cancer and has undergone her last chemotherapy treatment about 10 days ago.  Afterwards a few days later she noticed a lesion on her left hand where the IV had been placed.  She then noticed some increased redness around that wrist.  Her doctor started her on an antibiotic Keflex.  She then started to develop increased redness and itchiness across her body.  She subsequently stopped the Keflex.  Her doctor started her on Benadryl and prednisone but she has not seen significant improvement.  She also noted fever starting today 1 day.  Otherwise denies any cough no sore throat no runny nose.  No vomiting diarrhea no chest pain no abdominal pain.        Past Medical History:  Diagnosis Date  . Asthma   . Cancer ([place])   . Family history of breast cancer 07/08/2020  . Family history of ovarian cancer 07/08/2020  . Genital herpes     Patient Active Problem List   Diagnosis Date Noted  . Family history of ovarian cancer 07/08/2020  . Family history of breast cancer 07/08/2020  . Malignant neoplasm of overlapping sites of right breast in female, estrogen receptor positive ([place]) 07/01/2020  . Murmur 03/09/2014  . Bradycardia 03/09/2014  . Genital herpes     Past Surgical History:  Procedure Laterality Date  . BREAST LUMPECTOMY WITH RADIOACTIVE SEED AND SENTINEL LYMPH NODE BIOPSY Right 07/28/2020   Procedure: RIGHT BREAST LUMPECTOMY X 3  WITH RADIOACTIVE SEED AND SENTINEL LYMPH NODE BIOPSY;  Surgeon: Jovita Kussmaul, MD;  Location: [facility];  Service: General;  Laterality: Right;  PEC BLOCK  . FOOT SURGERY Right   . MOUTH SURGERY Right      OB History   No obstetric history on file.      Family History  Problem Relation Age of Onset  . CVA Other   . Ovarian cancer Sister 61  . Breast cancer Other        maternal grandmother's sister, dx 74s, d. 38s    Social History   Tobacco Use  . Smoking status: Never Smoker  . Smokeless tobacco: Never Used  Vaping Use  . Vaping Use: Never used  Substance Use Topics  . Alcohol use: Yes    Comment: social  . Drug use: No    Home Medications Prior to Admission medications   Medication Sig Start Date End Date Taking? Authorizing Provider  levofloxacin (LEVAQUIN) 500 MG tablet Take 1 tablet (500 mg total) by mouth daily. 11/16/20  Yes Luna Fuse, MD  BLACK CURRANT SEED OIL PO Take 1 Dose by mouth every Wednesday. Liquid  Patient not taking: Reported on 11/08/2020    [provider]  cholecalciferol (VITAMIN D3) 25 MCG (1000 UNIT) tablet Take 2,000 Units by mouth every Tuesday.  Patient not taking: Reported on 11/08/2020    [provider]  Coenzyme Q10 (COQ-10) 100 MG CAPS Take 100 mg by mouth every Friday.     [provider]  HYDROcodone-acetaminophen (NORCO/VICODIN) 5-325 MG tablet Take 1-2 tablets by mouth every 6 (six) hours as needed for moderate pain or severe pain. Patient not taking: Reported  on 11/08/2020 07/28/20   [name], MD  ibuprofen (ADVIL) 200 MG tablet Take 200-400 mg by mouth every 6 (six) hours as needed for headache or moderate pain.     [provider]  Omega 3-6-9 Fatty Acids (OMEGA 3-6-9 COMPLEX PO) Take 1 capsule by mouth every Sunday.  Patient not taking: Reported on 11/08/2020    [provider]  OVER THE COUNTER MEDICATION Take 2 capsules by mouth daily. The [facility] Choice Healthy Hair otc supplement    [provider]  Prenatal Vit-Fe Fumarate-FA (PRENATAL MULTIVITAMIN) TABS tablet Take 1 tablet by mouth every Monday.  Patient not taking: Reported on 11/08/2020    [provider]  Tetrahydrozoline HCl (VISINE OP) Place 1 drop  into both eyes daily as needed (redness).     [provider]  vitamin E 180 MG (400 UNITS) capsule Take 400 Units by mouth every Thursday.  Patient not taking: Reported on 11/08/2020    [provider]    Allergies    Shellfish allergy  Review of Systems   Review of Systems  Constitutional: Positive for fever.  HENT: Negative for ear pain.   Eyes: Negative for pain.  Respiratory: Negative for cough.   Cardiovascular: Negative for chest pain.  Gastrointestinal: Negative for abdominal pain.  Genitourinary: Negative for flank pain.  Musculoskeletal: Negative for back pain.  Skin: Positive for rash.  Neurological: Negative for headaches.    Physical Exam Updated Vital Signs BP 104/71 (BP Location: Left Arm)   Pulse 95   Temp 98.6 F (37 C) (Oral)   Resp 16   Ht 5\' 1"  (1.549 m)   Wt 65.8 kg   SpO2 99%   BMI 27.40 kg/m   Physical Exam Constitutional:      General: She is not in acute distress.    Appearance: Normal appearance.  HENT:     Head: Normocephalic.     Nose: Nose normal.  Eyes:     Extraocular Movements: Extraocular movements intact.  Cardiovascular:     Rate and Rhythm: Normal rate.  Pulmonary:     Effort: Pulmonary effort is normal.  Abdominal:     Palpations: Abdomen is soft.     Tenderness: There is no abdominal tenderness.  Musculoskeletal:        General: Normal range of motion.     Cervical back: Normal range of motion.  Skin:    General: Skin is warm.     Comments: utricarial rash seen on left and right upper extremities.  No ulcerative lesions noted.  No purulent drainage noted.  Neurological:     General: No focal deficit present.     Mental Status: She is alert.     ED Results / Procedures / Treatments   Labs (all labs ordered are listed, but only abnormal results are displayed) Labs Reviewed  CBC WITH DIFFERENTIAL/PLATELET - Abnormal; Notable for the following components:      Result Value   WBC 12.9 (*)    RBC  3.50 (*)    Hemoglobin 10.8 (*)    HCT 33.2 (*)    Neutro Abs 11.9 (*)    Lymphs Abs 0.5 (*)    Abs Immature Granulocytes 0.21 (*)    All other components within normal limits  COMPREHENSIVE METABOLIC PANEL - Abnormal; Notable for the following components:   Glucose, Bld 130 (*)    Creatinine, Ser 1.05 (*)    All other components within normal limits  URINALYSIS, ROUTINE W  REFLEX MICROSCOPIC - Abnormal; Notable for the following components:   APPearance HAZY (*)    Leukocytes,Ua TRACE (*)    All other components within normal limits  CULTURE, BLOOD (ROUTINE X 2)  CULTURE, BLOOD (ROUTINE X 2)  RESP PANEL BY RT-PCR (FLU A&B, COVID) ARPGX2  LACTIC ACID, PLASMA  LACTIC ACID, PLASMA    EKG None  Radiology DG Chest Port 1 View  Result Date: 11/16/2020 CLINICAL DATA:  Shortness of breath EXAM: PORTABLE CHEST 1 VIEW COMPARISON:  CT 07/18/2020 FINDINGS: The heart size and mediastinal contours are within normal limits. Both lungs are clear. The visualized skeletal structures are unremarkable. IMPRESSION: No active disease. Electronically Signed   By: Jasmine Pang M.D.   On: 11/16/2020 19:18    Procedures Procedures (including critical care time)  Medications Ordered in ED Medications  acetaminophen (TYLENOL) tablet 650 mg (650 mg Oral Given 11/16/20 1957)  sodium chloride 0.9 % bolus 1,000 mL (1,000 mLs Intravenous New Bag/Given 11/16/20 1944)    ED Course  I have reviewed the triage vital signs and the nursing notes.  Pertinent labs & imaging results that were available during my care of the patient were reviewed by me and considered in my medical decision making (see chart for details).    MDM Rules/Calculators/A&P                          Labs sent and unremarkable white count normal chemistry normal.  Urinalysis shows no evidence of bacteremia.  Covid test sent and pending final result.  White count is normal.  Given unclear cause of her fever discussed with patient  starting empiric antibiotics.  However her white count is normal she does not appear neutropenic.  Will recommend close follow-up with her allergist tomorrow via phone call.  Advised immediate return if she has persistent fevers worsening rash swelling or any additional concerns.   Final Clinical Impression(s) / ED Diagnoses Final diagnoses:  Febrile illness  Rash    Rx / DC Orders ED Discharge Orders         Ordered    levofloxacin (LEVAQUIN) 500 MG tablet  Daily        11/16/20 2045           Cheryll Cockayne, MD 11/16/20 2045

## 2020-11-21 ENCOUNTER — Encounter: Payer: Self-pay | Admitting: Radiation Oncology

## 2020-11-21 ENCOUNTER — Ambulatory Visit
Admission: RE | Admit: 2020-11-21 | Discharge: 2020-11-21 | Disposition: A | Discharge status: Home / Self Care | Payer: 59 | Source: Ambulatory Visit | Attending: Radiation Oncology | Admitting: Radiation Oncology

## 2020-11-21 ENCOUNTER — Other Ambulatory Visit: Payer: Self-pay

## 2020-11-21 DIAGNOSIS — R21 Rash and other nonspecific skin eruption: Secondary | ICD-10-CM | POA: Insufficient documentation

## 2020-11-21 DIAGNOSIS — R0602 Shortness of breath: Secondary | ICD-10-CM | POA: Diagnosis not present

## 2020-11-21 DIAGNOSIS — Z79899 Other long term (current) drug therapy: Secondary | ICD-10-CM | POA: Diagnosis not present

## 2020-11-21 DIAGNOSIS — R509 Fever, unspecified: Secondary | ICD-10-CM | POA: Diagnosis not present

## 2020-11-21 DIAGNOSIS — Z170 Estrogen receptor positive status [ER+]: Secondary | ICD-10-CM | POA: Diagnosis not present

## 2020-11-21 DIAGNOSIS — I890 Lymphedema, not elsewhere classified: Secondary | ICD-10-CM | POA: Insufficient documentation

## 2020-11-21 DIAGNOSIS — C50811 Malignant neoplasm of overlapping sites of right female breast: Secondary | ICD-10-CM

## 2020-11-21 LAB — CULTURE, BLOOD (ROUTINE X 2)
Culture: NO GROWTH
Special Requests: ADEQUATE

## 2020-11-21 NOTE — Progress Notes (Signed)
Radiation Oncology         [phone] ________________________________  Name: [name] [name] MRN: [id number]  Date: 11/21/2020  DOB: Jan 21, 1971  Re-Evaluation Note  CC: [name], [name], MD  Magrinat, Virgie Dad, MD    ICD-10-CM   1. Malignant neoplasm of overlapping sites of right breast in female, estrogen receptor positive ([place])  C50.811    Z17.0     Diagnosis:  Stage IA (mpT1c, pN0) Right Breast LOQ, Invasive Ductal Carcinoma with DCIS, ER+ / PR+ / Her2-, Grade 2  Narrative:  The patient returns today to discuss radiation treatment options. She was seen in the multidisciplinary breast clinic on 07/06/2020. At that time, it was recommended that she proceed with genetic testing, MRI, chest CT scan, right lumpectomy with sentinel lymph node biopsy, oncotype, adjuvant radiation therapy, and anti-estrogen therapy.  MRI of bilateral breasts on 07/13/2020 showed the known sites of malignancy in the lateral portion of the right breast that measured 1.8 and 2.5 cm. There was also a suspicious mass in the posterior lower outer quadrant of the right breast that warranted tissue diagnosis. There were post-biopsy changes in the right axilla. Left breast was negative.  Chest CT scan on 07/18/2020 showed an 11 mm soft tissue nodule in the inferior right breast with localization marker, likely site of primary disease. There were also borderline enlarged right axillary lymph nodes that were indeterminate. There was no other evidence for metastatic disease in the thorax.  Biopsy of the posterior LOQ of the right breast on 07/22/2020 showed ductal carcinoma in situ with atypical lobular hyperplasia. No invasive carcinoma was identified.  The patient underwent a right breast lumpectomy x3 with sentinel lymph node biopsy on 07/28/2020 under the care of Dr. Marlou Starks. Pathology from the procedure revealed grade 2 multifocal invasive ductal carcinoma with intermediate grade ductal carcinoma in situ. Closest  margin to invasive carcinoma was superior at less than 1 mm. Closet margin to ductal carcinoma was superior at less than 1 mm. Four right axillary sentinel lymph nodes were biopsied, all of which were negative for carcinoma. Right breast additional superior and medial margins were both negative for carcinoma.  Oncotype DX was obtained on the final surgical sample and the recurrence score of 28 predicted a risk of recurrence outside the breast over the next 9 years of 17%, if the patient's only systemic therapy was an antiestrogen for 5 years. It also predicted a significant benefit from chemotherapy.  The patient received a second opinion at [facility] and received adjuvant chemotherapy there. She underwent four cycles of TC with Neulasta support from 09/02/2020 - 11/04/2020. She tolerated treatment relatively well with the exception of constipation, occasional numbness and tingling in her right foot, and hair loss on the back and sides of her scalp.  Of note, the patient was seen in the ED on 11/16/2020 for evaluation of fever, shortness of breath, and rash. Chest x-ray at that time was unremarkable. Labs were also unremarkable. Given unclear cause of fever, she was empirically started on Levaquin. Close follow-up with her allergist was recommended.  On review of systems, the patient reports lymphedema for which she is receiving PT. She denies breast pain and any other symptoms.    Allergies:  is allergic to keflex [cephalexin] and shellfish allergy.  Meds: Current Outpatient Medications  Medication Sig Dispense Refill  . BLACK CURRANT SEED OIL PO Take 1 Dose by mouth every Wednesday. Liquid  (Patient not taking: Reported on 11/08/2020)    .  cholecalciferol (VITAMIN D3) 25 MCG (1000 UNIT) tablet Take 2,000 Units by mouth every Tuesday.  (Patient not taking: Reported on 11/08/2020)    . Coenzyme Q10 (COQ-10) 100 MG CAPS Take 100 mg by mouth every Friday.     Marland Kitchen  HYDROcodone-acetaminophen (NORCO/VICODIN) 5-325 MG tablet Take 1-2 tablets by mouth every 6 (six) hours as needed for moderate pain or severe pain. (Patient not taking: Reported on 11/08/2020) 20 tablet 0  . ibuprofen (ADVIL) 200 MG tablet Take 200-400 mg by mouth every 6 (six) hours as needed for headache or moderate pain.     Marland Kitchen levofloxacin (LEVAQUIN) 500 MG tablet Take 1 tablet (500 mg total) by mouth daily. 7 tablet 0  . Omega 3-6-9 Fatty Acids (OMEGA 3-6-9 COMPLEX PO) Take 1 capsule by mouth every Sunday.  (Patient not taking: Reported on 11/08/2020)    . OVER THE COUNTER MEDICATION Take 2 capsules by mouth daily. [facility] Choice Healthy Hair otc supplement    . Prenatal Vit-Fe Fumarate-FA (PRENATAL MULTIVITAMIN) TABS tablet Take 1 tablet by mouth every Monday.  (Patient not taking: Reported on 11/08/2020)    . Tetrahydrozoline HCl (VISINE OP) Place 1 drop into both eyes daily as needed (redness).     . vitamin E 180 MG (400 UNITS) capsule Take 400 Units by mouth every Thursday.  (Patient not taking: Reported on 11/08/2020)     No current facility-administered medications for this encounter.    Physical Findings: The patient is in no acute distress. Patient is alert and oriented.  height is '5\' 1"'  (1.549 m) and weight is 149 lb 6 oz (67.8 kg). Her temporal temperature is 97.7 F (36.5 C). Her blood pressure is 108/74 and her pulse is 93. Her respiration is 18 and oxygen saturation is 99%.  No significant changes. Lungs are clear to auscultation bilaterally. Heart has regular rate and rhythm. No palpable cervical, supraclavicular, or axillary adenopathy. Abdomen soft, non-tender, normal bowel sounds. Right breast: Well-healed long scar in the lateral aspect of the breast from which 3 lumpectomies were performed.  Separate scar in the axillary region.  Some induration noted underneath the lumpectomy scar but no dominant mass appreciated breast nipple discharge or bleeding  Lab Findings: Lab  Results  Component Value Date   WBC 12.9 (H) 11/16/2020   HGB 10.8 (L) 11/16/2020   HCT 33.2 (L) 11/16/2020   MCV 94.9 11/16/2020   PLT 272 11/16/2020    Radiographic Findings: DG Chest Port 1 View  Result Date: 11/16/2020 CLINICAL DATA:  Shortness of breath EXAM: PORTABLE CHEST 1 VIEW COMPARISON:  CT 07/18/2020 FINDINGS: The heart size and mediastinal contours are within normal limits. Both lungs are clear. The visualized skeletal structures are unremarkable. IMPRESSION: No active disease. Electronically Signed   By: Donavan Foil M.D.   On: 11/16/2020 19:18    Impression: Stage IA (mpT1c, pN0) Right Breast LOQ, Invasive Ductal Carcinoma with DCIS, ER+ / PR+ / Her2-, Grade 2  Patient has completed her adjuvant chemotherapy and is now ready to proceed with radiation therapy as breast conserving treatment.  I discussed the overall treatment course side effects and potential toxicities of radiation therapy in this situation with the patient and her husband.  She appears to understand and wishes to proceed with planned course of treatment.  Patient would be a candidate for hypofractionated accelerated radiation therapy over approximately 4 weeks.  She will make a determination concerning the fractionation scheme at the time of simulation tomorrow.  Plan:  Patient  is scheduled for CT simulation January 4.  With treatments to begin approximately 1 week later.  Anticipate between 4 and 6-1/2 weeks of radiation therapy depending on fractionation scheme  Total time spent in this encounter was 35 minutes which included reviewing the patient's most recent breast MRI, chest CT scan, biopsies, lumpectomy, pathology reports, oncotype, chemotherapy, consultations, follow-ups, ED visit, physical examination, and documentation.  -----------------------------------  Blair Promise, PhD, MD  This document serves as a record of services personally performed by Gery Pray, MD. It was created on his  behalf by Clerance Lav, a trained medical scribe. The creation of this record is based on the scribe's personal observations and the provider's statements to them. This document has been checked and approved by the attending provider.

## 2020-11-21 NOTE — Progress Notes (Signed)
[place]  [phone] Fax:([phone]) [phone]     ID: [name] [name] DOB: 21-Nov-1970  MR#: [id number]  HGD#:[id number]  Patient Care Team: Aretta Nip, MD as PCP - General (Family Medicine) Mauro Kaufmann, RN as Oncology Nurse Navigator Rockwell Germany, RN as Oncology Nurse Navigator Jovita Kussmaul, MD as Consulting Physician (General Surgery) Parilee Hally, Virgie Dad, MD as Consulting Physician (Oncology) Gery Pray, MD as Consulting Physician (Radiation Oncology) Janyth Pupa, DO as Consulting Physician (Obstetrics and Gynecology) Claris Pong, MD as Referring Physician (Hematology and Oncology) Chauncey Cruel, MD OTHER MD:  CHIEF COMPLAINT: Estrogen receptor positive breast cancer  CURRENT TREATMENT: Adjuvant radiation pending   INTERVAL HISTORY: [name] [name] returns today for follow-up of her estrogen positive breast cancer accompanied by her husband [name] [name].    She sought a second opinion regarding adjuvant chemotherapy at [facility].  She met with Dr. Donnita Falls 09/23/2020.  Dr. Dollene Cleveland suggested docetaxel and cyclophosphamide every 3 weeks x 4 cycles, and discussed in detail the possible toxicities side effects and complications.  The patient underwent four cycles of TC with Neulasta support from 09/02/2020 - 11/04/2020. She tolerated treatment relatively well with the exception of constipation, occasional numbness and tingling in her right foot, and hair loss on the back and sides of her scalp (despite the DignaCap).  Overall she feels she did very well with the treatment  She saw Dr. Sondra Come yesterday, 11/21/2020, to discuss radiation therapy. She will undergo CT simulation later this morning in anticipation of beginning treatment in the near future, with 4 weeks of treatment anticipated.  [name] [name] tells me that at her last treatment at [facility] the nurse accessing a vein in the dorsum of her left wrist noted or did something strange and  after that she had significant inflammation there.  She had some Keflex leftover from an earlier episode of mild cellulitis and took that.  However she developed a rash and fever and was seen in the emergency room 11/16/2020.She was started on Benadryl and prednisone without improvement.  Follow-up with an allergist was recommended.   REVIEW OF SYSTEMS: [name] [name] is very concerned about the irritated area on the dorsum of her left wrist, which is consistent with a resolving phlebitis.  It does appear there will be some scarring.  She is pleased that she did not lose all her hair, which had been a major concern to her.  She has no neuropathy symptoms in her hand.  She says she has some numbness and tingling particularly at night, sometimes and during the day in her toes bilaterally.  This does not affect her walking or balance.  Her last menstrual period was August 2021.  Aside from these issues a detailed review of systems today was stable.   COVID 19 VACCINATION STATUS: s/p Pfizer x 2, last dose July   HISTORY OF CURRENT ILLNESS: From the original intake note:  [name] [name] herself noted a change in her right breast when exercising.  She thinks she first noticed this sometime in April 2021.  It was like an indentation when she AB duct at the right upper extremity.  Screening mammogram from 04/15/2020 was negative, with breast density category C.  However the problem became more of a palpable nontender lump and after evaluation by her primary care physician she underwent right breast ultrasonography at [facility] on 06/23/2020 showing: 1.6 cm mass in right breast at 8 o'clock; 1.2 cm mass in right breast at 9 o'clock; the masses are  1.5 cm apart; single indeterminate mildly abnormal node in right axilla.  Accordingly on 06/27/2020 she proceeded to biopsy of the right breast areas in question. The pathology from this procedure ([id number]) showed: invasive and in situ mammary carcinoma, e-cadherin positive,  grade 1-2. Both masses showed similar morphology. Prognostic indicators significant for: estrogen receptor, 100% positive with strong staining intensity and progesterone receptor, 90% positive with moderate-weak staining intensity. Proliferation marker Ki67 at 2%. HER2 equivocal by immunohistochemistry (2+), but negative by fluorescent in situ hybridization with a signals ratio 1.47 and number per cell 2.5.  The patient's subsequent history is as detailed below.    PAST MEDICAL HISTORY: Past Medical History:  Diagnosis Date  . Asthma   . Cancer ([place])   . Family history of breast cancer 07/08/2020  . Family history of ovarian cancer 07/08/2020  . Genital herpes     PAST SURGICAL HISTORY: Past Surgical History:  Procedure Laterality Date  . BREAST LUMPECTOMY WITH RADIOACTIVE SEED AND SENTINEL LYMPH NODE BIOPSY Right 07/28/2020   Procedure: RIGHT BREAST LUMPECTOMY X 3  WITH RADIOACTIVE SEED AND SENTINEL LYMPH NODE BIOPSY;  Surgeon: Jovita Kussmaul, MD;  Location: [place];  Service: General;  Laterality: Right;  PEC BLOCK  . FOOT SURGERY Right   . MOUTH SURGERY Right     FAMILY HISTORY: Family History  Problem Relation Age of Onset  . CVA Other   . Ovarian cancer Sister 67  . Breast cancer Other        maternal grandmother's sister, dx 27s, d. 16s   Her father is 75, and her mother 38 as of 06/2020. [name] [name] has 3 brothers and 4 sisters. She reports ovarian cancer in her sister at age 60 (sister is doing well now) and breast cancer in a maternal great-aunt (post-menopausal).   GYNECOLOGIC HISTORY:   Last menstrual period August 2021  Menarche: 50 years old Age at first live birth: 50 years old [place] P 1 LMP periods are irregular Contraceptive: never used HRT n/a  Hysterectomy? no BSO? no   SOCIAL HISTORY: (updated 06/2020)  [name] [name] work as a Dentist. Husband [name] [name] "[name] [name]" is a Airline pilot. She lives at home with [name] [name]. Daughter [name] [name], age 2, is a Writer here in [place]. [name] [name] attends [employer].    ADVANCED DIRECTIVES: In the absence of any documentation to the contrary, the patient's spouse is their HCPOA.    HEALTH MAINTENANCE: Social History   Tobacco Use  . Smoking status: Never Smoker  . Smokeless tobacco: Never Used  Vaping Use  . Vaping Use: Never used  Substance Use Topics  . Alcohol use: Yes    Comment: social  . Drug use: No     Colonoscopy: n/a (age)  PAP: 01/2020  Bone density: n/a (age)   Allergies  Allergen Reactions  . Keflex [Cephalexin] Itching  . Shellfish Allergy     Throat and ear itching     Current Outpatient Medications  Medication Sig Dispense Refill  . BLACK CURRANT SEED OIL PO Take 1 Dose by mouth every Wednesday. Liquid  (Patient not taking: Reported on 11/08/2020)    . cholecalciferol (VITAMIN D3) 25 MCG (1000 UNIT) tablet Take 2,000 Units by mouth every Tuesday.  (Patient not taking: Reported on 11/08/2020)    . Coenzyme Q10 (COQ-10) 100 MG CAPS Take 100 mg by mouth every Friday.     Marland Kitchen HYDROcodone-acetaminophen (NORCO/VICODIN) 5-325 MG tablet Take 1-2 tablets by mouth every 6 (six) hours as needed  for moderate pain or severe pain. (Patient not taking: Reported on 11/08/2020) 20 tablet 0  . ibuprofen (ADVIL) 200 MG tablet Take 200-400 mg by mouth every 6 (six) hours as needed for headache or moderate pain.     Marland Kitchen levofloxacin (LEVAQUIN) 500 MG tablet Take 1 tablet (500 mg total) by mouth daily. 7 tablet 0  . Omega 3-6-9 Fatty Acids (OMEGA 3-6-9 COMPLEX PO) Take 1 capsule by mouth every Sunday.  (Patient not taking: Reported on 11/08/2020)    . OVER THE COUNTER MEDICATION Take 2 capsules by mouth daily. [facility] Choice Healthy Hair otc supplement    . Prenatal Vit-Fe Fumarate-FA (PRENATAL MULTIVITAMIN) TABS tablet Take 1 tablet by mouth every Monday.  (Patient not taking: Reported on 11/08/2020)    . Tetrahydrozoline HCl (VISINE OP) Place 1 drop into both eyes daily as needed  (redness).     . vitamin E 180 MG (400 UNITS) capsule Take 400 Units by mouth every Thursday.  (Patient not taking: Reported on 11/08/2020)     No current facility-administered medications for this visit.    OBJECTIVE: African-American woman who appears younger than stated age  94:   11/22/20 1023  BP: 99/66  Pulse: 95  Resp: 18  Temp: 98.8 F (37.1 C)  SpO2: 100%     Body mass index is 28.13 kg/m.   Wt Readings from Last 3 Encounters:  11/22/20 148 lb 14.4 oz (67.5 kg)  11/21/20 149 lb 6 oz (67.8 kg)  11/16/20 145 lb (65.8 kg)      ECOG FS:1 - Symptomatic but completely ambulatory  Sclerae unicteric, EOMs intact Wearing a mask No cervical or supraclavicular adenopathy Lungs no rales or rhonchi Heart regular rate and rhythm Abd soft, nontender, positive bowel sounds MSK no focal spinal tenderness, no upper extremity lymphedema Neuro: nonfocal, well oriented, appropriate affect Breasts: The right breast is status post lumpectomy.  The cosmetic result is good.  There is no evidence of residual recurrent disease.  The left breast is benign.  Both axillae are benign  LAB RESULTS:  CMP     Component Value Date/Time   NA 138 11/16/2020 1923   K 4.2 11/16/2020 1923   CL 106 11/16/2020 1923   CO2 24 11/16/2020 1923   GLUCOSE 130 (H) 11/16/2020 1923   BUN 14 11/16/2020 1923   CREATININE 1.05 (H) 11/16/2020 1923   CREATININE 0.91 08/03/2020 1536   CALCIUM 9.1 11/16/2020 1923   PROT 7.0 11/16/2020 1923   ALBUMIN 3.8 11/16/2020 1923   AST 21 11/16/2020 1923   AST 47 (H) 08/03/2020 1536   ALT 19 11/16/2020 1923   ALT 58 (H) 08/03/2020 1536   ALKPHOS 88 11/16/2020 1923   BILITOT 0.6 11/16/2020 1923   BILITOT 0.4 08/03/2020 1536   GFRNONAA >60 11/16/2020 1923   GFRNONAA >60 08/03/2020 1536   GFRAA >60 08/03/2020 1536    No results found for: TOTALPROTELP, ALBUMINELP, A1GS, A2GS, BETS, BETA2SER, GAMS, MSPIKE, SPEI  Lab Results  Component Value Date   WBC 12.9  (H) 11/16/2020   NEUTROABS 11.9 (H) 11/16/2020   HGB 10.8 (L) 11/16/2020   HCT 33.2 (L) 11/16/2020   MCV 94.9 11/16/2020   PLT 272 11/16/2020    No results found for: LABCA2  No components found for: GQBVQX450  No results for input(s): INR in the last 168 hours.  No results found for: LABCA2  No results found for: TUU828  No results found for: MKL491  No results found for:  OEU235  No results found for: CA2729  No components found for: HGQUANT  No results found for: CEA1 / No results found for: CEA1   No results found for: AFPTUMOR  No results found for: CHROMOGRNA  No results found for: KPAFRELGTCHN, LAMBDASER, KAPLAMBRATIO (kappa/lambda light chains)  No results found for: HGBA, HGBA2QUANT, HGBFQUANT, HGBSQUAN (Hemoglobinopathy evaluation)   No results found for: LDH  No results found for: IRON, TIBC, IRONPCTSAT (Iron and TIBC)  No results found for: FERRITIN  Urinalysis    Component Value Date/Time   COLORURINE YELLOW 11/16/2020 1923   APPEARANCEUR HAZY (A) 11/16/2020 1923   LABSPEC 1.013 11/16/2020 1923   PHURINE 7.0 11/16/2020 1923   GLUCOSEU NEGATIVE 11/16/2020 1923   HGBUR NEGATIVE 11/16/2020 1923   BILIRUBINUR NEGATIVE 11/16/2020 [place] NEGATIVE 11/16/2020 1923   PROTEINUR NEGATIVE 11/16/2020 1923   NITRITE NEGATIVE 11/16/2020 1923   LEUKOCYTESUR TRACE (A) 11/16/2020 1923     STUDIES: DG Chest Port 1 View  Result Date: 11/16/2020 CLINICAL DATA:  Shortness of breath EXAM: PORTABLE CHEST 1 VIEW COMPARISON:  CT 07/18/2020 FINDINGS: The heart size and mediastinal contours are within normal limits. Both lungs are clear. The visualized skeletal structures are unremarkable. IMPRESSION: No active disease. Electronically Signed   By: Donavan Foil M.D.   On: 11/16/2020 19:18      ELIGIBLE FOR AVAILABLE RESEARCH PROTOCOL: AET  ASSESSMENT: 50 y.o. [place] woman status post right breast biopsy x2 on 06/27/2020 for an mT1c N0, stage IA  invasive ductal carcinoma, grade 1 or 2, estrogen and progesterone receptor positive, HER-2 not amplified, with an MIB-1 of 2%.  (a) and equivocal right axillary lymph node was biopsied 06/27/2020 and was benign  (b) chest CT scan 07/08/2020 showed no evidence of metastatic disease  (1) genetics testing recommended on 07/07/2020 but declined by patient  (2)  S/p right lumpectomy 07/28/2020 for an  mpT1c pN0, stage IA invasive ductal carcinoma, grade 2, with negative margins   (a) a total of 4 right axillary lymph nodes removed, all clear  (3) Oncotype score of 28 predicts a risk of this breast cancer recurring outside the breast within the next 9 years of 17% if the patient's only systemic therapy is antiestrogens for 5 years.  It also predicts a significant benefit from chemotherapy.  (4) adjuvant chemotherapy consisting of docetaxel and cyclophosphamide given every 21 days x 4 started 09/02/2020, completed 11/04/2020 [facility])  (5) adjuvant radiation pending  (6) to start tamoxifen at the completion of local treatment  (a) consider goserelin/anastrozole if menstruation resumes post chemo   PLAN: Marykathryn did remarkably well with her chemotherapy. She was able to keep most of her hair. 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 Hopefully this will improve with time  She is wearing her right upper extremity compression sleeve appropriately and we discussed that. She continues to have some itching of her palms. She requested some oral prednisone for this but we discussed the possible side effects of that medication and basically were going to watch that for now since it seems to be getting better.  She does have some reflux and we suggested Prilosec 20 mg at bedtime for a few days. She of course is reluctant to take any unnecessary medicines so she will consider that. She is very concerned about the phlebitis in the dorsum of her left wrist. I think she will end up with some  scarring there but for the time being she can use  over-the-counter Cortaid twice daily and see if that helps.  She has not had a period since August 2021 as noted above.  She is having significant hot flashes.  I think she has about a one third or 40% chance of her periods resuming and if so I have asked her to let us know so we can discuss goserelin.  I will check an [place] and estradiol level at her next visit  Otherwise I am delighted at how well she is doing. She will start radiation likely next week. I'm going to see her the second half of February and at that time we will likely initiate tamoxifen.  Total encounter time 35 minutes.Sarajane Jews C. Neomi Laidler, MD 11/22/20 10:32 AM Medical Oncology and Hematology [facility] [place],  [address] Tel. [phone]    Fax. [phone]   I, Wilburn Mylar, am acting as scribe for Dr. Virgie Dad. Xzavier Swinger.  I, Lurline Del MD, have reviewed the above documentation for accuracy and completeness, and I agree with the above.   *Total Encounter Time as defined by the Centers for Medicare and Medicaid Services includes, in addition to the face-to-face time of a patient visit (documented in the note above) non-face-to-face time: obtaining and reviewing outside history, ordering and reviewing medications, tests or procedures, care coordination (communications with other health care professionals or caregivers) and documentation in the medical record.

## 2020-11-21 NOTE — Progress Notes (Signed)
Patient here for a consult with Dr. Sondra Come.  Location of Breast Cancer: Right breast  Histology per Pathology Report: Invasive ductal with DCIS  Receptor Status: ER(+), PR (+), Her2-neu, Grade 1-2  Did patient present with symptoms: Patient had a right breast indentation and palpable lump July 2021. 06/23/2020 breast ultrasound.  Past/Anticipated interventions by surgeon, if any:07/28/2020 right lumpectomy  Past/Anticipated interventions by medical oncology, if any: chemo  Lymphedema issues, if any: yes, receiving PT  Pain issues, if any:  no  SAFETY ISSUES:  Prior radiation? no  Pacemaker/ICD? no  Possible current pregnancy? no  Is the patient on methotrexate? no  Current Complaints / other details:  Patient is accompanied by her husband.  BP 108/74 (BP Location: Left Arm, Patient Position: Sitting)   Pulse 93   Temp 97.7 F (36.5 C) (Temporal)   Resp 18   Ht 5' 1" (1.549 m)   Wt 149 lb 6 oz (67.8 kg)   SpO2 99%   BMI 28.22 kg/m   Wt Readings from Last 3 Encounters:  11/21/20 149 lb 6 oz (67.8 kg)  11/16/20 145 lb (65.8 kg)  08/22/20 146 lb 12.8 oz (66.6 kg)     wt    De Burrs, RN 11/21/2020,8:48 AM

## 2020-11-22 ENCOUNTER — Other Ambulatory Visit: Payer: Self-pay

## 2020-11-22 ENCOUNTER — Inpatient Hospital Stay: Payer: 59 | Attending: Oncology | Admitting: Oncology

## 2020-11-22 ENCOUNTER — Ambulatory Visit
Admission: RE | Admit: 2020-11-22 | Discharge: 2020-11-22 | Disposition: A | Discharge status: Home / Self Care | Payer: 59 | Source: Ambulatory Visit | Attending: Radiation Oncology | Admitting: Radiation Oncology

## 2020-11-22 VITALS — BP 99/66 | HR 95 | Temp 98.8°F | Resp 18 | Ht 61.0 in | Wt 148.9 lb

## 2020-11-22 DIAGNOSIS — C50811 Malignant neoplasm of overlapping sites of right female breast: Secondary | ICD-10-CM | POA: Insufficient documentation

## 2020-11-22 DIAGNOSIS — N951 Menopausal and female climacteric states: Secondary | ICD-10-CM | POA: Diagnosis not present

## 2020-11-22 DIAGNOSIS — Z170 Estrogen receptor positive status [ER+]: Secondary | ICD-10-CM

## 2020-11-22 DIAGNOSIS — Z510 Encounter for antineoplastic radiation therapy: Secondary | ICD-10-CM | POA: Insufficient documentation

## 2020-11-22 DIAGNOSIS — G629 Polyneuropathy, unspecified: Secondary | ICD-10-CM | POA: Diagnosis not present

## 2020-11-24 ENCOUNTER — Other Ambulatory Visit: Payer: Self-pay

## 2020-11-24 ENCOUNTER — Ambulatory Visit: Payer: 59 | Attending: General Surgery | Admitting: Physical Therapy

## 2020-11-24 ENCOUNTER — Encounter: Payer: Self-pay | Admitting: General Practice

## 2020-11-24 ENCOUNTER — Encounter: Payer: Self-pay | Admitting: Physical Therapy

## 2020-11-24 ENCOUNTER — Encounter: Payer: Self-pay | Admitting: Oncology

## 2020-11-24 DIAGNOSIS — I890 Lymphedema, not elsewhere classified: Secondary | ICD-10-CM

## 2020-11-24 DIAGNOSIS — Z483 Aftercare following surgery for neoplasm: Secondary | ICD-10-CM | POA: Diagnosis not present

## 2020-11-24 NOTE — Therapy (Signed)
[place], [place], [address] Phone: [phone]   Fax:  [phone]  Physical Therapy Treatment  Patient Details  Name: Kenidee Cregan MRN: [id number] Date of Birth: 01/08/1971 Referring Provider (PT): Dr. Autumn Messing   Encounter Date: 11/24/2020   PT End of Session - 11/24/20 1732    Visit Number 11    Number of Visits 25    Date for PT Re-Evaluation 01/03/21    PT Start Time 1400    PT Stop Time 1455    PT Time Calculation (min) 55 min    Activity Tolerance Patient tolerated treatment well    Behavior During Therapy [facility] for tasks assessed/performed           Past Medical History:  Diagnosis Date  . Asthma   . Cancer ([place])   . Family history of breast cancer 07/08/2020  . Family history of ovarian cancer 07/08/2020  . Genital herpes     Past Surgical History:  Procedure Laterality Date  . BREAST LUMPECTOMY WITH RADIOACTIVE SEED AND SENTINEL LYMPH NODE BIOPSY Right 07/28/2020   Procedure: RIGHT BREAST LUMPECTOMY X 3  WITH RADIOACTIVE SEED AND SENTINEL LYMPH NODE BIOPSY;  Surgeon: Jovita Kussmaul, MD;  Location: [place];  Service: General;  Laterality: Right;  PEC BLOCK  . FOOT SURGERY Right   . MOUTH SURGERY Right     There were no vitals filed for this visit.   Subjective Assessment - 11/24/20 1718    Subjective Pt says that she has had some trouble since her last chemo.  She is had a spot come up on the back of her left hand about 10 days after infusion and has had strage symptoms if itchiness. She is going to the allergist tomorrow.  She has been wearing her compression sleeve daily.  She got the gauntlet, but found that it made her hand swell up more so she has not been wearing it. Visible edema on thumb side of hand is present today. She will contact [facility] to get a different gauntlet or glove to keep the swelling down in her hand when she is wearing the sleeve.  She says that there is  no swelling in her hand when she wakes up in the morning.  She is pleased  that the swelling in her axilla is reduced but she still has visible and palpable cording in her antecubital fossa.    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 It is ER/PR positive and HER2 negative with a Ki67 of 2%. Patient reports she underwent a right lumpectomy and sentinel node biopsy (4 negative nodes) on 07/28/2020. She developed a seroma in her right axilla that has been drained and now has cording in her right axilla. She is undergoing chemotherapy and is not sure if she will have radiation.    Patient Stated Goals to get rid of  the pulling in her arm and make sure it is ok    Currently in Pain? No/denies              [facility] PT Assessment - 11/24/20 0001      AROM   Right Shoulder Flexion 174 Degrees    Right Shoulder ABduction 165 Degrees                         [facility] Adult PT Treatment/Exercise - 11/24/20 0001  Manual Therapy   Myofascial Release To Rt UE focusing at visible cording at antecubital fossa and into forearm    Manual Lymphatic Drainage (MLD) with hand elevated to right upper arm. lower arm and hand. Swelling visibly reduced in right hand at end of session    Passive ROM In supine to Rt shoulder into flexion, abduction and D2 incorporating MFR, pt conts with improved end motion in all directions by end of session                       PT Long Term Goals - 11/24/20 1738      PT LONG TERM GOAL #1   Title Pt will have 150 degrees of right shoulder abduction with no pulling from axillary cording.    Status Achieved      PT LONG TERM GOAL #2   Title Pt will know how to manage the fullness and pain from cording in her right axilla with MLD, compression and exercise    Status Achieved      PT LONG TERM GOAL #3   Title Pt will have return of L-Dex score to baseling warranting no further  treatment for lymphedema    Status On-going                 Plan - 11/24/20 1733    Clinical Impression Statement Pt has met her goals for right shoulder ROM and continues to exercise. She will be starting radiation next week and goes back to work on Monday.  She continues to have persistent cording in her antecubital fossa that has been resistant to manual techniques and use of compression. However, she does not feel the pulling down into her forearm so she feels the cording is lessening.  Hopefully it will continue to improve and she will continue to work on it on her own at home as she will not have time for continued PT with return to work and radiation.  Pt will get a new gauntlet or glove to control her hand swelling and return for SOZO on Jan 24.  She will come back to PT 2 weeks after radiation is complete for reassessment    Personal Factors and Comorbidities Comorbidity 3+    Comorbidities previous lumpectomy and 4 nodes removed, Ongoing chemotherapy    Stability/Clinical Decision Making Stable/Uncomplicated    Rehab Potential Excellent    PT Frequency 2x / week    PT Treatment/Interventions ADLs/Self Care Home Management;Therapeutic exercise;Patient/family education;Manual techniques;Manual lymph drainage;Compression bandaging;Scar mobilization;Passive range of motion    PT Next Visit Plan SOZO recheck.  reassessment and PT intervention as needed    Consulted and Agree with Plan of Care Patient           Patient will benefit from skilled therapeutic intervention in order to improve the following deficits and impairments:  Postural dysfunction,Decreased knowledge of precautions,Impaired UE functional use,Pain,Decreased range of motion  Visit Diagnosis: Aftercare following surgery for neoplasm  Lymphedema, not elsewhere classified     Problem List Patient Active Problem List   Diagnosis Date Noted  . Family history of ovarian cancer 07/08/2020  . Family history of  breast cancer 07/08/2020  . Malignant neoplasm of overlapping sites of right breast in female, estrogen receptor positive ([place]) 07/01/2020  . Murmur 03/09/2014  . Bradycardia 03/09/2014  . Genital herpes    Donato Heinz. Owens Shark, PT  [place] 11/24/2020, 5:39 PM  [place]  [place], [place], [address] Phone: [phone]   Fax:  [phone]  Name: Meredyth Hornung MRN: [id number] Date of Birth: December 13, 1970

## 2020-11-24 NOTE — Progress Notes (Signed)
[facility] Spiritual Care Note  Followed up with [facility] by phone as planned. She is taking care to rest through the last bit of her medical leave before she goes back to work and starts radiation. She is also monitoring a rash-like reaction that she has been sharing with her physicians, especially because she is concerned about how radiation may affect that area. Encouraged her always to report her symptoms and questions. Provided empathic listening, pastoral reflection, and emotional support. Reflective listening continues to be very meaningful for [name] [name] as she processes her cancer experience, so she requests continued pastoral check-ins. Plan to phone again in ca two weeks.   [address] [place], [place], [facility] Pager [phone] Voicemail [phone]

## 2020-11-25 ENCOUNTER — Encounter: Payer: Self-pay | Admitting: Allergy

## 2020-11-25 ENCOUNTER — Ambulatory Visit: Payer: 59 | Admitting: Allergy

## 2020-11-25 VITALS — BP 104/66 | HR 110 | Temp 97.2°F | Resp 18 | Ht 61.0 in | Wt 145.8 lb

## 2020-11-25 DIAGNOSIS — T7800XD Anaphylactic reaction due to unspecified food, subsequent encounter: Secondary | ICD-10-CM | POA: Diagnosis not present

## 2020-11-25 DIAGNOSIS — T50905D Adverse effect of unspecified drugs, medicaments and biological substances, subsequent encounter: Secondary | ICD-10-CM | POA: Diagnosis not present

## 2020-11-25 DIAGNOSIS — T783XXD Angioneurotic edema, subsequent encounter: Secondary | ICD-10-CM | POA: Diagnosis not present

## 2020-11-25 DIAGNOSIS — L509 Urticaria, unspecified: Secondary | ICD-10-CM

## 2020-11-25 MED ORDER — EPINEPHRINE 0.3 MG/0.3ML IJ SOAJ: 0.3000 mg | INTRAMUSCULAR | 1 refills | Status: DC | PRN

## 2020-11-25 NOTE — Progress Notes (Signed)
New Patient Note  RE: [name] [name] MRN: [id number] DOB: 1971-03-21 Date of Office Visit: 11/25/2020  Referring provider: Aretta Nip, MD Primary care provider: Aretta Nip, MD  Chief Complaint: Hives, reaction  History of present illness: [name] [name] is a 50 y.o. female presenting today for consultation for possible medication reaction, urticaria. She has history of breast cancer and is underwent chemotherapy.  She finished her chemotherapy on 11/04/20.    She developed a lesion/rash on the back of her hand where she had an IV placement from her most recent chemotherapy.  She states she noted it look like it was bruised on 11/13/20.  She states the lesion on hand was itchy.   She decided to take Keflex in the evening of 11/13/2020 for this as she had some from a previous use as below.   She also states she also resumed taking a hair supplement 11/13/20 morning. She had been taking these previously without issue.   She states she had Keflex for a previous rash she had and used that medication before from 10/27/20-12/14 with no issues. this was the first time she had Keflex to her recollection.   She developed itchy, swollen hands and welts on her body on Monday 11/14/20 morning.  She states she woke up with the rash.   After a day she states she continued to have itchy rash.  She showed the rash to her [facility] physician. She was prescribed prednisone for 4 days started on 11/17/20.   She states the rash did improved but didn't subside.   She started to take benadryl which also hasn't helped much.  She has continued to have some itch and some welts however it is milder. She states her food on 11/14/20 would have been her normal foods as she states her taste buds are off after having chemotherapy.  She believes she would have had noodles, leftover meal from christmas that would have consisted of salmon, meatballs (beef), mac and cheese, green bean cassarole, [place].  She  states she likely may have had some wine.   She recalls watching football and denies any extrenous activity on 11/14/20.   She went to the ED on 12/29 after developing a fever to 100.5.  She per her report normal blood work at that time.  There is no identifiable source of this elevated temperature which had resolved that she was discharged.  She states she is a rash may have been related to medication allergy.  She is set to start radiation next Wednesday.    She states 2 days ago she started noting ear itch and throat itch.   She has seen an allergist in the past; over 10 years ago due to shellfish allergy.    Review of systems: Review of Systems  Constitutional: Positive for fever.  HENT: Negative.   Eyes: Negative.   Respiratory: Negative.   Cardiovascular: Negative.   Gastrointestinal: Negative.   Musculoskeletal: Negative.   Skin: Positive for itching and rash.  Neurological: Negative.     All other systems negative unless noted above in HPI  Past medical history: Past Medical History:  Diagnosis Date  . Asthma   . Cancer ([place])   . Family history of breast cancer 07/08/2020  . Family history of ovarian cancer 07/08/2020  . Genital herpes     Past surgical history: Past Surgical History:  Procedure Laterality Date  . BREAST LUMPECTOMY WITH RADIOACTIVE SEED AND SENTINEL LYMPH NODE BIOPSY Right 07/28/2020  Procedure: RIGHT BREAST LUMPECTOMY X 3  WITH RADIOACTIVE SEED AND SENTINEL LYMPH NODE BIOPSY;  Surgeon: Jovita Kussmaul, MD;  Location: [facility];  Service: General;  Laterality: Right;  PEC BLOCK  . FOOT SURGERY Right   . MOUTH SURGERY Right     Family history:  Family History  Problem Relation Age of Onset  . CVA Other   . Ovarian cancer Sister 57  . Breast cancer Other        maternal grandmother's sister, dx 20s, d. 35s    Social history: Lives in a home with carpeting in the bedroom with electric heating and central cooling.  No pets in the home.  There is no  concern for water damage, mildew or roaches in the home.  She is a Customer service manager.  She denies a smoking history.  Medication List: Current Outpatient Medications  Medication Sig Dispense Refill  . EPINEPHrine 0.3 mg/0.3 mL IJ SOAJ injection Inject 0.3 mg into the muscle as needed for anaphylaxis. 1 each 1  . ibuprofen (ADVIL) 200 MG tablet Take 200-400 mg by mouth every 6 (six) hours as needed for headache or moderate pain.     . Multiple Vitamins-Minerals (HAIR SKIN AND NAILS FORMULA) TABS See admin instructions.    Marland Kitchen OVER THE COUNTER MEDICATION Take 2 capsules by mouth daily. [facility] Choice Healthy Hair otc supplement    . Tetrahydrozoline HCl (VISINE OP) Place 1 drop into both eyes daily as needed (redness).     Marland Kitchen levofloxacin (LEVAQUIN) 500 MG tablet Take 1 tablet (500 mg total) by mouth daily. (Patient not taking: Reported on 11/25/2020) 7 tablet 0   No current facility-administered medications for this visit.    Known medication allergies: Allergies  Allergen Reactions  . Keflex [Cephalexin] Itching  . Shellfish Allergy     Throat and ear itching      Physical examination: Blood pressure 104/66, pulse (!) 110, temperature (!) 97.2 F (36.2 C), temperature source Temporal, resp. rate 18, height _0  (1.549 m), weight 145 lb 12.8 oz (66.1 kg), SpO2 96 %.  General: Alert, interactive, in no acute distress. HEENT: PERRLA, TMs pearly gray, turbinates non-edematous without discharge, post-pharynx non erythematous. Neck: Supple without lymphadenopathy. Lungs: Clear to auscultation without wheezing, rhonchi or rales. {no increased work of breathing. CV: Normal S1, S2 without murmurs. Abdomen: Nondistended, nontender. Skin: Warm and dry, without lesions or rashes. Extremities:  No clubbing, cyanosis or edema. Neuro:   Grossly intact.  Diagnositics/Labs: Labs:  Component     Latest Ref Rng & Units 11/16/2020  WBC     4.0 - 10.5 K/uL 12.9 (H)  RBC     3.87 - 5.11 MIL/uL 3.50 (L)   Hemoglobin     12.0 - 15.0 g/dL 10.8 (L)  HCT     36.0 - 46.0 % 33.2 (L)  MCV     80.0 - 100.0 fL 94.9  MCH     26.0 - 34.0 pg 30.9  MCHC     30.0 - 36.0 g/dL 32.5  RDW     11.5 - 15.5 % 14.9  Platelets     150 - 400 K/uL 272  nRBC     0.0 - 0.2 % 0.0  Neutrophils     % 92  NEUT#     1.7 - 7.7 K/uL 11.9 (H)  Lymphocytes     % 4  Lymphocyte #     0.7 - 4.0 K/uL 0.5 (L)  Monocytes Relative     %  2  Monocyte #     0.1 - 1.0 K/uL 0.3  Eosinophil     % 0  Eosinophils Absolute     0.0 - 0.5 K/uL 0.0  Basophil     % 0  Basophils Absolute     0.0 - 0.1 K/uL 0.0  Immature Granulocytes     % 2  Abs Immature Granulocytes     0.00 - 0.07 K/uL 0.21 (H)  Sodium     135 - 145 mmol/L 138  Potassium     3.5 - 5.1 mmol/L 4.2  Chloride     98 - 111 mmol/L 106  CO2     22 - 32 mmol/L 24  Glucose     70 - 99 mg/dL 130 (H)  BUN     6 - 20 mg/dL 14  Creatinine     0.44 - 1.00 mg/dL 1.05 (H)  Calcium     8.9 - 10.3 mg/dL 9.1  Total Protein     6.5 - 8.1 g/dL 7.0  Albumin     3.5 - 5.0 g/dL 3.8  AST     15 - 41 U/L 21  ALT     0 - 44 U/L 19  Alkaline Phosphatase     38 - 126 U/L 88  Total Bilirubin     0.3 - 1.2 mg/dL 0.6  GFR, Estimated     >60 mL/min >60  Anion gap     5 - 15 8  Color, Urine     YELLOW YELLOW  Appearance     CLEAR HAZY (A)  Specific Gravity, Urine     1.005 - 1.030 1.013  pH     5.0 - 8.0 7.0  Glucose, UA     NEGATIVE mg/dL NEGATIVE  Hgb urine dipstick     NEGATIVE NEGATIVE  Bilirubin Urine     NEGATIVE NEGATIVE  Ketones, ur     NEGATIVE mg/dL NEGATIVE  Protein     NEGATIVE mg/dL NEGATIVE  Nitrite     NEGATIVE NEGATIVE  Leukocytes,Ua     NEGATIVE TRACE (A)  RBC / HPF     0 - 5 RBC/hpf 0-5  WBC, UA     0 - 5 WBC/hpf 11-20  Bacteria, UA     NONE SEEN NONE SEEN  Squamous Epithelial / LPF     0 - 5 6-10  Special Requests      BOTTLES DRAWN AEROBIC AND ANAEROBIC Blood Culture adequate volume . . .  Culture      NO GROWTH  5 DAYS . . .  Report Status      11/21/2020 FINAL  SARS Coronavirus 2 by RT PCR     NEGATIVE NEGATIVE  Influenza A By PCR     NEGATIVE NEGATIVE  Influenza B By PCR     NEGATIVE NEGATIVE  Lactic Acid, Venous     0.5 - 1.9 mmol/L 1.1    Assessment and plan: Urticaria/angioedema Possible medication adverse effects    -At this time etiology of hives and swelling is unknown.  However it is possible that it could have been triggered by use of Keflex.  This was her second time he taking this medication that she has had a sensitizing event.  At this time would recommend she avoid Keflex and other first generation cephalosporins that contain the same R-side chain group.  She should be able to tolerate the newer generations if needing a cephalosporin.  I do not believe the  rash and symptoms are related to the hair vitamin.  There is some concern that it may have been triggered by the red meat ingestion she have the night before onset of symptoms and will evaluate for red meat allergy and the alpha gal panel.  -Hives can be caused by a variety of different triggers including illness/infection, foods, medications, stings, exercise, pressure, vibrations, extremes of temperature to name a few however majority of the time there is no identifiable trigger.  Will obtain labwork to evaluate: tryptase, hive panel, environmental panel, alpha-gal panel.    -Recent CBC does show slight elevation in white blood cell count however she had been on prednisone prior to this draw.  She is having repeat CBC later today.  CMP was normal.  -For hive management recommend taking Claritin 10 mg 1 tablet once a day with Pepcid 20 mg 1 tablet once a day at this time.  Advised if hive free for about a week or 2 then she can stop his medications  -Discussed today that we can evaluate further into the Keflex issue by performing a graded challenge in the future  -Recommend she have access to an epinephrine device in case of allergic  reaction requiring use  -Should significant symptoms recur or new symptoms occur, a journal is to be kept recording any foods eaten, beverages consumed, medications taken, activities performed, and environmental conditions within a 6 hour time period prior to the onset of symptoms. For any symptoms concerning for anaphylaxis, epinephrine is to be administered and 911 is to be called immediately.   Anaphylaxis due to food  -Ccontinue avoidance of shellfish.  Also recommend to avoid red meat until the labs return above  -Have access to self-injectable epinephrine (Epipen or AuviQ) 0.41m at all times  -Will provide with emergency action plan to follow in case of allergic reaction  Follow-up in 2 to 3 months or sooner if needed  I appreciate the opportunity to take part in Caleesi's care. Please do not hesitate to contact me with questions.  Sincerely,   SPrudy Feeler MD Allergy/Immunology [facility] [place]

## 2020-11-25 NOTE — Patient Instructions (Addendum)
  -  At this time etiology of hives and swelling is unknown.  However it is possible that it could have been triggered by use of Keflex.  This was her second time he taking this medication that she has had a sensitizing event.  At this time would recommend she avoid Keflex and other first generation cephalosporins that contain the same R-side chain group.  She should be able to tolerate the newer generations if needing a cephalosporin.  I do not believe the rash and symptoms are related to the hair vitamin.  There is some concern that it may have been triggered by the red meat ingestion she have the night before onset of symptoms and will evaluate for red meat allergy and the alpha gal panel.  -Hives can be caused by a variety of different triggers including illness/infection, foods, medications, stings, exercise, pressure, vibrations, extremes of temperature to name a few however majority of the time there is no identifiable trigger.  Will obtain labwork to evaluate: tryptase, hive panel, environmental panel, alpha-gal panel.    -Recent CBC does show slight elevation in white blood cell count however she had been on prednisone prior to this draw.  She is having repeat CBC later today.  CMP was normal.  -For hive management recommend taking Claritin 10 mg 1 tablet once a day with Pepcid 20 mg 1 tablet once a day at this time.  Advised if hive free for about a week or 2 then she can stop his medications  -Discussed today that we can evaluate further into the Keflex issue by performing a graded challenge in the future  -Recommend she have access to an epinephrine device in case of allergic reaction requiring use  -Should significant symptoms recur or new symptoms occur, a journal is to be kept recording any foods eaten, beverages consumed, medications taken, activities performed, and environmental conditions within a 6 hour time period prior to the onset of symptoms. For any symptoms concerning for anaphylaxis,  epinephrine is to be administered and 911 is to be called immediately.    -Ccontinue avoidance of shellfish.  Also recommend to avoid red meat until the labs return above  -Have access to self-injectable epinephrine (Epipen or AuviQ) 0.3mg  at all times  -Will provide with emergency action plan to follow in case of allergic reaction  Follow-up in 2 to 3 months or sooner if needed

## 2020-11-27 DIAGNOSIS — Z510 Encounter for antineoplastic radiation therapy: Secondary | ICD-10-CM | POA: Diagnosis not present

## 2020-11-28 ENCOUNTER — Encounter: Payer: Self-pay | Admitting: *Deleted

## 2020-11-28 NOTE — Telephone Encounter (Signed)
LM for Ms [name] [name]. Dr Jana Hakim just wanted to follow up to make she was doing OK with the itching and rash. Noted that she saw [facility] [place]

## 2020-11-30 ENCOUNTER — Other Ambulatory Visit: Payer: Self-pay

## 2020-11-30 ENCOUNTER — Ambulatory Visit
Admission: RE | Admit: 2020-11-30 | Discharge: 2020-11-30 | Disposition: A | Discharge status: Home / Self Care | Payer: 59 | Source: Ambulatory Visit | Attending: Radiation Oncology | Admitting: Radiation Oncology

## 2020-11-30 DIAGNOSIS — Z170 Estrogen receptor positive status [ER+]: Secondary | ICD-10-CM

## 2020-11-30 DIAGNOSIS — C50811 Malignant neoplasm of overlapping sites of right female breast: Secondary | ICD-10-CM

## 2020-11-30 DIAGNOSIS — Z510 Encounter for antineoplastic radiation therapy: Secondary | ICD-10-CM | POA: Diagnosis not present

## 2020-11-30 MED ORDER — RADIAPLEXRX EX GEL: Freq: Once | CUTANEOUS | Status: AC

## 2020-11-30 MED ORDER — ALRA NON-METALLIC DEODORANT (RAD-ONC)
1.0000 "application " | Freq: Once | TOPICAL | Status: AC
  Administered 2020-11-30: 1 via TOPICAL

## 2020-12-01 ENCOUNTER — Ambulatory Visit
Admission: RE | Admit: 2020-12-01 | Discharge: 2020-12-01 | Disposition: A | Discharge status: Home / Self Care | Payer: 59 | Source: Ambulatory Visit | Attending: Radiation Oncology | Admitting: Radiation Oncology

## 2020-12-01 ENCOUNTER — Other Ambulatory Visit: Payer: Self-pay

## 2020-12-01 DIAGNOSIS — Z510 Encounter for antineoplastic radiation therapy: Secondary | ICD-10-CM | POA: Diagnosis not present

## 2020-12-01 NOTE — Progress Notes (Signed)
Pt here for patient teaching.  Pt given Radiation and You booklet, skin care instructions, Alra deodorant and Radiaplex gel.  Reviewed areas of pertinence such as fatigue, skin changes, breast tenderness and breast swelling . Pt able to give teach back of to pat skin and use unscented/gentle soap,apply Radiaplex bid, avoid applying anything to skin within 4 hours of treatment, avoid wearing an under wire bra and to use an electric razor if they must shave. Pt verbalized understanding and radiplex and alra was given   Http://rtanswers.org/treatmentinformation/whattoexpect/index

## 2020-12-02 ENCOUNTER — Ambulatory Visit
Admission: RE | Admit: 2020-12-02 | Discharge: 2020-12-02 | Disposition: A | Discharge status: Home / Self Care | Payer: 59 | Source: Ambulatory Visit | Attending: Radiation Oncology | Admitting: Radiation Oncology

## 2020-12-02 ENCOUNTER — Other Ambulatory Visit: Payer: Self-pay

## 2020-12-02 DIAGNOSIS — Z510 Encounter for antineoplastic radiation therapy: Secondary | ICD-10-CM | POA: Diagnosis not present

## 2020-12-05 ENCOUNTER — Ambulatory Visit: Payer: 59

## 2020-12-06 ENCOUNTER — Other Ambulatory Visit: Payer: Self-pay

## 2020-12-06 ENCOUNTER — Ambulatory Visit: Payer: 59 | Admitting: Physical Therapy

## 2020-12-06 ENCOUNTER — Ambulatory Visit: Payer: 59

## 2020-12-06 DIAGNOSIS — Z483 Aftercare following surgery for neoplasm: Secondary | ICD-10-CM

## 2020-12-06 NOTE — Therapy (Signed)
[place], [place], [address] Phone: [phone]   Fax:  [phone]  Physical Therapy Treatment  Patient Details  Name: [name] [name] MRN: [id number] Date of Birth: 05-22-1971 Referring Provider (PT): Dr. Autumn Messing   Encounter Date: 12/06/2020   PT End of Session - 12/06/20 1306    Visit Number 11   visit count not changed due to L dex screening   Number of Visits 25    Date for PT Re-Evaluation 01/03/21    PT Start Time 1254    PT Stop Time 1306    PT Time Calculation (min) 12 min    Activity Tolerance Patient tolerated treatment well    Behavior During Therapy [facility] for tasks assessed/performed           Past Medical History:  Diagnosis Date  . Asthma   . Cancer ([place])   . Family history of breast cancer 07/08/2020  . Family history of ovarian cancer 07/08/2020  . Genital herpes     Past Surgical History:  Procedure Laterality Date  . BREAST LUMPECTOMY WITH RADIOACTIVE SEED AND SENTINEL LYMPH NODE BIOPSY Right 07/28/2020   Procedure: RIGHT BREAST LUMPECTOMY X 3  WITH RADIOACTIVE SEED AND SENTINEL LYMPH NODE BIOPSY;  Surgeon: Jovita Kussmaul, MD;  Location: [facility];  Service: General;  Laterality: Right;  PEC BLOCK  . FOOT SURGERY Right   . MOUTH SURGERY Right     There were no vitals filed for this visit.   Subjective Assessment - 12/06/20 1305    Subjective Pt returns for 1 month L dex screening after she had a signficant change from baseline in mid Dec.    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 It is ER/PR positive and HER2 negative with a Ki67 of 2%. Patient reports she underwent a right lumpectomy and sentinel node biopsy (4 negative nodes) on 07/28/2020. She developed a seroma in her right axilla that has been drained and now has cording in her right axilla. She is undergoing chemotherapy and is not sure if she will  have radiation.                                          PT Long Term Goals - 11/24/20 1738      PT LONG TERM GOAL #1   Title Pt will have 150 degrees of right shoulder abduction with no pulling from axillary cording.    Status Achieved      PT LONG TERM GOAL #2   Title Pt will know how to manage the fullness and pain from cording in her right axilla with MLD, compression and exercise    Status Achieved      PT LONG TERM GOAL #3   Title Pt will have return of L-Dex score to baseling warranting no further treatment for lymphedema    Status On-going                 Plan - 12/06/20 1307    Clinical Impression Statement Pt returns for her L-Dex screening. She was last screened one month ago and at that time she demonstrated a signficant change from baseline. She was issued a sleeve and a glove to wear 12 hours a day for 4 weeks. She was remeasured today using SOZO and she decreased from  10.5 on 10/31/20 to 3.5 on 12/06/20. She was educated that she no longer has to wear her sleeve but pt feels more comfortable to continue wearing her sleeve throughout radiation. She was scheduled for another L-dex screening in 3 months.    PT Next Visit Plan pt to resume every 3 month SOZO screens    Consulted and Agree with Plan of Care Patient           Patient will benefit from skilled therapeutic intervention in order to improve the following deficits and impairments:     Visit Diagnosis: Aftercare following surgery for neoplasm     Problem List Patient Active Problem List   Diagnosis Date Noted  . Family history of ovarian cancer 07/08/2020  . Family history of breast cancer 07/08/2020  . Malignant neoplasm of overlapping sites of right breast in female, estrogen receptor positive ([place]) 07/01/2020  . Murmur 03/09/2014  . Bradycardia 03/09/2014  . Genital herpes     Allyson Sabal [facility] 12/06/2020, 1:11 PM  [place], [place], [address] Phone: [phone]   Fax:  [phone]  Name: [name] [name] MRN: [id number] Date of Birth: 01-17-1971  Manus Gunning, PT 12/06/20 1:11 PM

## 2020-12-07 ENCOUNTER — Ambulatory Visit
Admission: RE | Admit: 2020-12-07 | Discharge: 2020-12-07 | Disposition: A | Discharge status: Home / Self Care | Payer: 59 | Source: Ambulatory Visit | Attending: Radiation Oncology | Admitting: Radiation Oncology

## 2020-12-07 ENCOUNTER — Other Ambulatory Visit: Payer: Self-pay

## 2020-12-07 DIAGNOSIS — Z510 Encounter for antineoplastic radiation therapy: Secondary | ICD-10-CM | POA: Diagnosis not present

## 2020-12-07 LAB — ALLERGENS W/TOTAL IGE AREA 2
Alternaria Alternata IgE: <0.1 kU/L
Aspergillus Fumigatus IgE: <0.1 kU/L
Bermuda Grass IgE: <0.1 kU/L
Cat Dander IgE: <0.1 kU/L
Cedar, Mountain IgE: 0.11 kU/L — AB
Cladosporium Herbarum IgE: <0.1 kU/L
Cockroach, German IgE: 1.33 kU/L — AB
Common Silver Birch IgE: <0.1 kU/L
Cottonwood IgE: <0.1 kU/L
D Farinae IgE: 2.57 kU/L — AB
D Pteronyssinus IgE: 2.07 kU/L — AB
Dog Dander IgE: <0.1 kU/L
Elm, American IgE: <0.1 kU/L
Johnson Grass IgE: <0.1 kU/L
Maple/Box Elder IgE: <0.1 kU/L
Mouse Urine IgE: <0.1 kU/L
Oak, White IgE: <0.1 kU/L
Pecan, Hickory IgE: <0.1 kU/L
Penicillium Chrysogen IgE: <0.1 kU/L
Pigweed, Rough IgE: <0.1 kU/L
Ragweed, Short IgE: 0.31 kU/L — AB
Sheep Sorrel IgE Qn: <0.1 kU/L
Timothy Grass IgE: <0.1 kU/L
White Mulberry IgE: <0.1 kU/L

## 2020-12-07 LAB — ALLERGEN PROFILE, SHELLFISH
Clam IgE: 0.23 kU/L — AB
F023-IgE Crab: 3.37 kU/L — AB
F080-IgE Lobster: 2.48 kU/L — AB
F290-IgE Oyster: <0.1 kU/L
Scallop IgE: 0.3 kU/L — AB
Shrimp IgE: 4.35 kU/L — AB

## 2020-12-07 LAB — ALPHA-GAL PANEL
Allergen Lamb IgE: <0.1 kU/L
Beef IgE: <0.1 kU/L
IgE (Immunoglobulin E), Serum: 23 IU/mL (ref 6–495)
O215-IgE Alpha-Gal: <0.1 kU/L
Pork IgE: <0.1 kU/L

## 2020-12-07 LAB — CHRONIC URTICARIA: cu index: 2.9 (ref <10)

## 2020-12-07 LAB — TRYPTASE: Tryptase: 2.3 ug/L (ref 2.2–13.2)

## 2020-12-08 ENCOUNTER — Ambulatory Visit
Admission: RE | Admit: 2020-12-08 | Discharge: 2020-12-08 | Disposition: A | Discharge status: Home / Self Care | Payer: 59 | Source: Ambulatory Visit | Attending: Radiation Oncology | Admitting: Radiation Oncology

## 2020-12-08 ENCOUNTER — Encounter: Payer: Self-pay | Admitting: General Practice

## 2020-12-08 DIAGNOSIS — Z510 Encounter for antineoplastic radiation therapy: Secondary | ICD-10-CM | POA: Diagnosis not present

## 2020-12-08 NOTE — Progress Notes (Signed)
[facility] Note  Followed up with [name] [name] by phone at an inconvenient time, so she plans to return call later.   [place], [place], [facility] Pager [phone] Voicemail [phone]

## 2020-12-09 ENCOUNTER — Ambulatory Visit
Admission: RE | Admit: 2020-12-09 | Discharge: 2020-12-09 | Disposition: A | Discharge status: Home / Self Care | Payer: 59 | Source: Ambulatory Visit | Attending: Radiation Oncology | Admitting: Radiation Oncology

## 2020-12-09 ENCOUNTER — Other Ambulatory Visit: Payer: Self-pay

## 2020-12-09 DIAGNOSIS — Z510 Encounter for antineoplastic radiation therapy: Secondary | ICD-10-CM | POA: Diagnosis not present

## 2020-12-11 DIAGNOSIS — Z510 Encounter for antineoplastic radiation therapy: Secondary | ICD-10-CM | POA: Diagnosis not present

## 2020-12-12 ENCOUNTER — Ambulatory Visit: Payer: 59

## 2020-12-12 ENCOUNTER — Other Ambulatory Visit: Payer: Self-pay

## 2020-12-12 ENCOUNTER — Ambulatory Visit
Admission: RE | Admit: 2020-12-12 | Discharge: 2020-12-12 | Disposition: A | Discharge status: Home / Self Care | Payer: 59 | Source: Ambulatory Visit | Attending: Radiation Oncology | Admitting: Radiation Oncology

## 2020-12-12 DIAGNOSIS — Z510 Encounter for antineoplastic radiation therapy: Secondary | ICD-10-CM | POA: Diagnosis not present

## 2020-12-13 ENCOUNTER — Ambulatory Visit
Admission: RE | Admit: 2020-12-13 | Discharge: 2020-12-13 | Disposition: A | Discharge status: Home / Self Care | Payer: 59 | Source: Ambulatory Visit | Attending: Radiation Oncology | Admitting: Radiation Oncology

## 2020-12-13 ENCOUNTER — Ambulatory Visit: Payer: 59 | Admitting: Radiation Oncology

## 2020-12-13 ENCOUNTER — Other Ambulatory Visit: Payer: Self-pay

## 2020-12-13 DIAGNOSIS — Z510 Encounter for antineoplastic radiation therapy: Secondary | ICD-10-CM | POA: Diagnosis not present

## 2020-12-14 ENCOUNTER — Ambulatory Visit
Admission: RE | Admit: 2020-12-14 | Discharge: 2020-12-14 | Disposition: A | Discharge status: Home / Self Care | Payer: 59 | Source: Ambulatory Visit | Attending: Radiation Oncology | Admitting: Radiation Oncology

## 2020-12-14 DIAGNOSIS — Z510 Encounter for antineoplastic radiation therapy: Secondary | ICD-10-CM | POA: Diagnosis not present

## 2020-12-15 ENCOUNTER — Ambulatory Visit
Admission: RE | Admit: 2020-12-15 | Discharge: 2020-12-15 | Disposition: A | Discharge status: Home / Self Care | Payer: 59 | Source: Ambulatory Visit | Attending: Radiation Oncology | Admitting: Radiation Oncology

## 2020-12-15 ENCOUNTER — Other Ambulatory Visit: Payer: Self-pay

## 2020-12-15 DIAGNOSIS — Z510 Encounter for antineoplastic radiation therapy: Secondary | ICD-10-CM | POA: Diagnosis not present

## 2020-12-16 ENCOUNTER — Other Ambulatory Visit: Payer: Self-pay

## 2020-12-16 ENCOUNTER — Ambulatory Visit
Admission: RE | Admit: 2020-12-16 | Discharge: 2020-12-16 | Disposition: A | Discharge status: Home / Self Care | Payer: 59 | Source: Ambulatory Visit | Attending: Radiation Oncology | Admitting: Radiation Oncology

## 2020-12-16 DIAGNOSIS — Z510 Encounter for antineoplastic radiation therapy: Secondary | ICD-10-CM | POA: Diagnosis not present

## 2020-12-19 ENCOUNTER — Ambulatory Visit
Admission: RE | Admit: 2020-12-19 | Discharge: 2020-12-19 | Disposition: A | Discharge status: Home / Self Care | Payer: 59 | Source: Ambulatory Visit | Attending: Radiation Oncology | Admitting: Radiation Oncology

## 2020-12-19 ENCOUNTER — Other Ambulatory Visit: Payer: Self-pay

## 2020-12-19 DIAGNOSIS — Z510 Encounter for antineoplastic radiation therapy: Secondary | ICD-10-CM | POA: Diagnosis not present

## 2020-12-20 ENCOUNTER — Ambulatory Visit
Admission: RE | Admit: 2020-12-20 | Discharge: 2020-12-20 | Disposition: A | Discharge status: Home / Self Care | Payer: 59 | Source: Ambulatory Visit | Attending: Radiation Oncology | Admitting: Radiation Oncology

## 2020-12-20 DIAGNOSIS — Z170 Estrogen receptor positive status [ER+]: Secondary | ICD-10-CM | POA: Diagnosis present

## 2020-12-20 DIAGNOSIS — C50811 Malignant neoplasm of overlapping sites of right female breast: Secondary | ICD-10-CM | POA: Insufficient documentation

## 2020-12-21 ENCOUNTER — Ambulatory Visit
Admission: RE | Admit: 2020-12-21 | Discharge: 2020-12-21 | Disposition: A | Discharge status: Home / Self Care | Payer: 59 | Source: Ambulatory Visit | Attending: Radiation Oncology | Admitting: Radiation Oncology

## 2020-12-21 ENCOUNTER — Ambulatory Visit: Payer: 59

## 2020-12-21 DIAGNOSIS — C50811 Malignant neoplasm of overlapping sites of right female breast: Secondary | ICD-10-CM | POA: Diagnosis not present

## 2020-12-22 ENCOUNTER — Other Ambulatory Visit: Payer: Self-pay

## 2020-12-22 ENCOUNTER — Ambulatory Visit
Admission: RE | Admit: 2020-12-22 | Discharge: 2020-12-22 | Disposition: A | Discharge status: Home / Self Care | Payer: 59 | Source: Ambulatory Visit | Attending: Radiation Oncology | Admitting: Radiation Oncology

## 2020-12-22 ENCOUNTER — Ambulatory Visit: Payer: 59

## 2020-12-22 DIAGNOSIS — C50811 Malignant neoplasm of overlapping sites of right female breast: Secondary | ICD-10-CM | POA: Diagnosis not present

## 2020-12-23 ENCOUNTER — Encounter: Payer: Self-pay | Admitting: *Deleted

## 2020-12-23 ENCOUNTER — Ambulatory Visit
Admission: RE | Admit: 2020-12-23 | Discharge: 2020-12-23 | Disposition: A | Discharge status: Home / Self Care | Payer: 59 | Source: Ambulatory Visit | Attending: Radiation Oncology | Admitting: Radiation Oncology

## 2020-12-23 ENCOUNTER — Other Ambulatory Visit: Payer: Self-pay

## 2020-12-23 ENCOUNTER — Ambulatory Visit: Payer: 59

## 2020-12-23 DIAGNOSIS — C50811 Malignant neoplasm of overlapping sites of right female breast: Secondary | ICD-10-CM | POA: Diagnosis not present

## 2020-12-26 ENCOUNTER — Other Ambulatory Visit: Payer: Self-pay

## 2020-12-26 ENCOUNTER — Ambulatory Visit
Admission: RE | Admit: 2020-12-26 | Discharge: 2020-12-26 | Disposition: A | Discharge status: Home / Self Care | Payer: 59 | Source: Ambulatory Visit | Attending: Radiation Oncology | Admitting: Radiation Oncology

## 2020-12-26 DIAGNOSIS — C50811 Malignant neoplasm of overlapping sites of right female breast: Secondary | ICD-10-CM | POA: Diagnosis not present

## 2020-12-27 ENCOUNTER — Ambulatory Visit: Payer: 59

## 2020-12-27 ENCOUNTER — Other Ambulatory Visit: Payer: Self-pay

## 2020-12-27 ENCOUNTER — Ambulatory Visit
Admission: RE | Admit: 2020-12-27 | Discharge: 2020-12-27 | Disposition: A | Discharge status: Home / Self Care | Payer: 59 | Source: Ambulatory Visit | Attending: Radiation Oncology | Admitting: Radiation Oncology

## 2020-12-27 DIAGNOSIS — C50811 Malignant neoplasm of overlapping sites of right female breast: Secondary | ICD-10-CM

## 2020-12-27 DIAGNOSIS — Z170 Estrogen receptor positive status [ER+]: Secondary | ICD-10-CM

## 2020-12-27 MED ORDER — RADIAPLEXRX EX GEL: Freq: Once | CUTANEOUS | Status: AC

## 2020-12-28 ENCOUNTER — Ambulatory Visit
Admission: RE | Admit: 2020-12-28 | Discharge: 2020-12-28 | Disposition: A | Discharge status: Home / Self Care | Payer: 59 | Source: Ambulatory Visit | Attending: Radiation Oncology | Admitting: Radiation Oncology

## 2020-12-28 ENCOUNTER — Ambulatory Visit: Payer: 59

## 2020-12-28 ENCOUNTER — Other Ambulatory Visit: Payer: Self-pay

## 2020-12-28 DIAGNOSIS — C50811 Malignant neoplasm of overlapping sites of right female breast: Secondary | ICD-10-CM | POA: Diagnosis not present

## 2020-12-29 ENCOUNTER — Ambulatory Visit: Payer: 59

## 2020-12-29 ENCOUNTER — Ambulatory Visit
Admission: RE | Admit: 2020-12-29 | Discharge: 2020-12-29 | Disposition: A | Discharge status: Home / Self Care | Payer: 59 | Source: Ambulatory Visit | Attending: Radiation Oncology | Admitting: Radiation Oncology

## 2020-12-29 ENCOUNTER — Encounter: Payer: Self-pay | Admitting: Radiation Oncology

## 2020-12-29 DIAGNOSIS — C50811 Malignant neoplasm of overlapping sites of right female breast: Secondary | ICD-10-CM | POA: Diagnosis not present

## 2020-12-30 ENCOUNTER — Ambulatory Visit: Payer: 59

## 2021-01-05 ENCOUNTER — Ambulatory Visit: Payer: 59 | Admitting: Physical Therapy

## 2021-01-09 ENCOUNTER — Ambulatory Visit: Payer: 59

## 2021-01-09 NOTE — Progress Notes (Signed)
[place]  [phone] [phone]     ID: Lailani Tool DOB: 27-May-1971  MR#: [id number]  PXT#:[id number]  Patient Care Team: Aretta Nip, MD as PCP - General (Family Medicine) Mauro Kaufmann, RN as Oncology Nurse Navigator Rockwell Germany, RN as Oncology Nurse Navigator Jovita Kussmaul, MD as Consulting Physician (General Surgery) Vestal Crandall, Virgie Dad, MD as Consulting Physician (Oncology) Gery Pray, MD as Consulting Physician (Radiation Oncology) Janyth Pupa, DO as Consulting Physician (Obstetrics and Gynecology) Claris Pong, MD as Referring Physician (Hematology and Oncology) Chauncey Cruel, MD OTHER MD:  CHIEF COMPLAINT: Estrogen receptor positive breast cancer  CURRENT TREATMENT: To start tamoxifen   INTERVAL HISTORY: Shada returns today for follow-up of her estrogen positive breast cancer accompanied by her husband [name].    Since her last visit, she underwent radiation treatment from 11/30/2020 through 12/29/2020 under Dr. Sondra Come.   REVIEW OF SYSTEMS: Jeanett Schlein did moderately well with her radiation treatments. She does have fatigue problems. She had minor skin changes. She is now going to the gym 2 or 3 times a week and taking some walks. She complains of nocturia and she has some pain in the lower abdomen reminiscent of menstrual cramps   COVID 19 VACCINATION STATUS: s/p Pfizer x 2, last dose July   HISTORY OF CURRENT ILLNESS: From the original intake note:  Elandra Powell herself noted a change in her right breast when exercising.  She thinks she first noticed this sometime in April 2021.  It was like an indentation when she AB duct at the right upper extremity.  Screening mammogram from 04/15/2020 was negative, with breast density category C.  However the problem became more of a palpable nontender lump and after evaluation by her primary care physician she underwent right breast ultrasonography at  [facility] on 06/23/2020 showing: 1.6 cm mass in right breast at 8 o'clock; 1.2 cm mass in right breast at 9 o'clock; the masses are 1.5 cm apart; single indeterminate mildly abnormal node in right axilla.  Accordingly on 06/27/2020 she proceeded to biopsy of the right breast areas in question. The pathology from this procedure ([id number]) showed: invasive and in situ mammary carcinoma, e-cadherin positive, grade 1-2. Both masses showed similar morphology. Prognostic indicators significant for: estrogen receptor, 100% positive with strong staining intensity and progesterone receptor, 90% positive with moderate-weak staining intensity. Proliferation marker Ki67 at 2%. HER2 equivocal by immunohistochemistry (2+), but negative by fluorescent in situ hybridization with a signals ratio 1.47 and number per cell 2.5.  The patient's subsequent history is as detailed below.    PAST MEDICAL HISTORY: Past Medical History:  Diagnosis Date   Asthma    Cancer ([place])    Family history of breast cancer 07/08/2020   Family history of ovarian cancer 07/08/2020   Genital herpes     PAST SURGICAL HISTORY: Past Surgical History:  Procedure Laterality Date   BREAST LUMPECTOMY WITH RADIOACTIVE SEED AND SENTINEL LYMPH NODE BIOPSY Right 07/28/2020   Procedure: RIGHT BREAST LUMPECTOMY X 3  WITH RADIOACTIVE SEED AND SENTINEL LYMPH NODE BIOPSY;  Surgeon: Jovita Kussmaul, MD;  Location: [place];  Service: General;  Laterality: Right;  PEC BLOCK   FOOT SURGERY Right    MOUTH SURGERY Right     FAMILY HISTORY: Family History  Problem Relation Age of Onset   CVA Other    Ovarian cancer Sister 25   Breast cancer Other        maternal grandmother's sister, dx  72s, d. 90s   Her father is 2, and her mother 44 as of 06/2020. Jeanett Schlein has 3 brothers and 4 sisters. She reports ovarian cancer in her sister at age 36 (sister is doing well now) and breast cancer in a maternal great-aunt (post-menopausal).   GYNECOLOGIC HISTORY:    Last menstrual period August 2021  Menarche: 50 years old Age at first live birth: 49 years old [place] P 1 LMP periods are irregular Contraceptive: Patient's husband is status post vasectomy HRT n/a  Hysterectomy? no BSO? no   SOCIAL HISTORY: (updated 06/2020)  Tomasa Hosteller work as a Dentist. Husband Iona Beard "[name]" is a Airline pilot. She lives at home with [name]. Daughter Sherlie Ban, age 74, is a Education administrator here in [place]. Jeanett Schlein attends [employer].    ADVANCED DIRECTIVES: In the absence of any documentation to the contrary, the patient's spouse is their HCPOA.    HEALTH MAINTENANCE: Social History   Tobacco Use   Smoking status: Never Smoker   Smokeless tobacco: Never Used  Vaping Use   Vaping Use: Never used  Substance Use Topics   Alcohol use: Yes    Comment: social   Drug use: No     Colonoscopy: n/a (age)  PAP: 01/2020  Bone density: n/a (age)   Allergies  Allergen Reactions   Keflex [Cephalexin] Itching   Shellfish Allergy     Throat and ear itching     Current Outpatient Medications  Medication Sig Dispense Refill   ibuprofen (ADVIL) 200 MG tablet Take 200-400 mg by mouth every 6 (six) hours as needed for headache or moderate pain.      Tetrahydrozoline HCl (VISINE OP) Place 1 drop into both eyes daily as needed (redness).      No current facility-administered medications for this visit.    OBJECTIVE: African-American woman who appears younger than stated age  70:   01/10/21 1103  BP: (!) 99/58  Pulse: 72  Resp: 18  Temp: 97.8 F (36.6 C)  SpO2: 100%     Body mass index is 27.59 kg/m.   Wt Readings from Last 3 Encounters:  01/10/21 146 lb (66.2 kg)  11/25/20 145 lb 12.8 oz (66.1 kg)  11/22/20 148 lb 14.4 oz (67.5 kg)      ECOG FS:1 - Symptomatic but completely ambulatory  Sclerae unicteric, EOMs intact Wearing a mask No cervical or supraclavicular adenopathy Lungs no rales or rhonchi Heart regular  rate and rhythm Abd soft, nontender, positive bowel sounds MSK no focal spinal tenderness, no upper extremity lymphedema Neuro: nonfocal, well oriented, appropriate affect Breasts: The right breast has undergone lumpectomy and radiation. There is a good cosmetic result. There is some coarsening of the skin and hyperpigmentation as expected. There is no evidence of residual or recurrent disease. Left breast and both axillae are benign.   LAB RESULTS:  CMP     Component Value Date/Time   NA 142 01/10/2021 1036   K 4.1 01/10/2021 1036   CL 108 01/10/2021 1036   CO2 26 01/10/2021 1036   GLUCOSE 94 01/10/2021 1036   BUN 11 01/10/2021 1036   CREATININE 0.81 01/10/2021 1036   CREATININE 0.91 08/03/2020 1536   CALCIUM 9.3 01/10/2021 1036   PROT 6.8 01/10/2021 1036   ALBUMIN 4.0 01/10/2021 1036   AST 22 01/10/2021 1036   AST 47 (H) 08/03/2020 1536   ALT 17 01/10/2021 1036   ALT 58 (H) 08/03/2020 1536   ALKPHOS 99 01/10/2021 1036   BILITOT 0.7  01/10/2021 1036   BILITOT 0.4 08/03/2020 1536   GFRNONAA >60 01/10/2021 1036   GFRNONAA >60 08/03/2020 1536   GFRAA >60 08/03/2020 1536    No results found for: TOTALPROTELP, ALBUMINELP, A1GS, A2GS, BETS, BETA2SER, GAMS, MSPIKE, SPEI  Lab Results  Component Value Date   WBC 2.9 (L) 01/10/2021   NEUTROABS 1.2 (L) 01/10/2021   HGB 12.0 01/10/2021   HCT 36.8 01/10/2021   MCV 91.8 01/10/2021   PLT 230 01/10/2021    No results found for: LABCA2  No components found for: DXAJOI786  No results for input(s): INR in the last 168 hours.  No results found for: LABCA2  No results found for: VEH209  No results found for: OBS962  No results found for: EZM629  No results found for: CA2729  No components found for: HGQUANT  No results found for: CEA1 / No results found for: CEA1   No results found for: AFPTUMOR  No results found for: CHROMOGRNA  No results found for: KPAFRELGTCHN, LAMBDASER, KAPLAMBRATIO (kappa/lambda light  chains)  No results found for: HGBA, HGBA2QUANT, HGBFQUANT, HGBSQUAN (Hemoglobinopathy evaluation)   No results found for: LDH  No results found for: IRON, TIBC, IRONPCTSAT (Iron and TIBC)  No results found for: FERRITIN  Urinalysis    Component Value Date/Time   COLORURINE YELLOW 11/16/2020 1923   APPEARANCEUR HAZY (A) 11/16/2020 1923   LABSPEC 1.013 11/16/2020 1923   PHURINE 7.0 11/16/2020 1923   GLUCOSEU NEGATIVE 11/16/2020 1923   HGBUR NEGATIVE 11/16/2020 [place] NEGATIVE 11/16/2020 [place] NEGATIVE 11/16/2020 1923   PROTEINUR NEGATIVE 11/16/2020 1923   NITRITE NEGATIVE 11/16/2020 1923   LEUKOCYTESUR TRACE (A) 11/16/2020 1923    STUDIES: No results found.    ELIGIBLE FOR AVAILABLE RESEARCH PROTOCOL: AET  ASSESSMENT: 50 y.o. [place] woman status post right breast biopsy x2 on 06/27/2020 for an mT1c N0, stage IA invasive ductal carcinoma, grade 1 or 2, estrogen and progesterone receptor positive, HER-2 not amplified, with an MIB-1 of 2%.  (a) and equivocal right axillary lymph node was biopsied 06/27/2020 and was benign  (b) chest CT scan 07/08/2020 showed no evidence of metastatic disease  (1) genetics testing recommended on 07/07/2020 but declined by patient  (2)  S/p right lumpectomy 07/28/2020 for an  mpT1c pN0, stage IA invasive ductal carcinoma, grade 2, with negative margins   (a) a total of 4 right axillary lymph nodes removed, all clear  (3) Oncotype score of 28 predicts a risk of this breast cancer recurring outside the breast within the next 9 years of 17% if the patient's only systemic therapy is antiestrogens for 5 years.  It also predicts a significant benefit from chemotherapy.  (4) adjuvant chemotherapy consisting of docetaxel and cyclophosphamide given every 21 days x 4 started 09/02/2020, completed 11/04/2020 [facility])  (5) adjuvant radiation completed 12/29/2020  (6) tamoxifen started 01/10/2021  (a) consider  goserelin/anastrozole if menstruation resumes post chemo   PLAN: Kadance has completed her local treatment and is now ready to start her antiestrogens. _0 @   has completed her local treatment and is now ready to start anti-estrogens. We discussed the difference between tamoxifen and anastrozole in detail. She understands that anastrozole and the aromatase inhibitors in general work by blocking estrogen production. Accordingly vaginal dryness, decrease in bone density, and of course hot flashes can result. The aromatase inhibitors can also negatively affect the cholesterol profile, although that is a minor effect. One out of 5 women on aromatase inhibitors we  will feel "old and achy". This arthralgia/myalgia syndrome, which resembles fibromyalgia clinically, does resolve with stopping the medications. Accordingly this is not a reason to not try an aromatase inhibitor but it is a frequent reason to stop it (in other words 20% of women will not be able to tolerate these medications).  Tamoxifen on the other hand does not block estrogen production. It does not "take away a woman's estrogen". It blocks the estrogen receptor in breast cells. Like anastrozole, it can also cause hot flashes. As opposed to anastrozole, tamoxifen has many estrogen-like effects. It is technically an estrogen receptor modulator. This means that in some tissues tamoxifen works like estrogen-- for example it helps strengthen the bones. It tends to improve the cholesterol profile. It can cause thickening of the endometrial lining, and even endometrial polyps or rarely cancer of the uterus.(The risk of uterine cancer due to tamoxifen is one additional cancer per thousand women year). It can cause vaginal wetness or stickiness. It can cause blood clots through this estrogen-like effect--the risk of blood clots with tamoxifen is exactly the same as with birth control pills or hormone replacement.  Neither of these agents  causes mood changes or weight gain, despite the popular belief that they can have these side effects. We have data from studies comparing either of these drugs with placebo, and in those cases the control group had the same amount of weight gain and depression as the group that took the drug.  After this discussion and because she may well regain her menstrual function (she may be having some menstrual cramps at present) we are going to go with tamoxifen. She may start now she wishes or wait a week or 2 and I am going to see her virtually in about 6 weeks to make sure she is tolerating it well.  Total encounter time 35 minutes.Sarajane Jews C. Joevanni Roddey, MD 01/10/21 6:36 PM Medical Oncology and Hematology [facility] [place], [place] [address] Tel. [phone]    Fax. [phone]   I, Wilburn Mylar, am acting as scribe for Dr. Virgie Dad. Demetrice Amstutz.  I, Lurline Del MD, have reviewed the above documentation for accuracy and completeness, and I agree with the above.   *Total Encounter Time as defined by the Centers for Medicare and Medicaid Services includes, in addition to the face-to-face time of a patient visit (documented in the note above) non-face-to-face time: obtaining and reviewing outside history, ordering and reviewing medications, tests or procedures, care coordination (communications with other health care professionals or caregivers) and documentation in the medical record.

## 2021-01-10 ENCOUNTER — Ambulatory Visit: Payer: 59 | Attending: General Surgery | Admitting: Physical Therapy

## 2021-01-10 ENCOUNTER — Encounter: Payer: Self-pay | Admitting: Plastic Surgery

## 2021-01-10 ENCOUNTER — Ambulatory Visit: Payer: 59 | Admitting: Plastic Surgery

## 2021-01-10 ENCOUNTER — Inpatient Hospital Stay: Payer: 59

## 2021-01-10 ENCOUNTER — Other Ambulatory Visit: Payer: Self-pay

## 2021-01-10 ENCOUNTER — Encounter: Payer: Self-pay | Admitting: Physical Therapy

## 2021-01-10 ENCOUNTER — Inpatient Hospital Stay: Payer: 59 | Attending: Oncology | Admitting: Oncology

## 2021-01-10 VITALS — BP 106/72 | HR 58 | Temp 97.5°F

## 2021-01-10 VITALS — BP 99/58 | HR 72 | Temp 97.8°F | Resp 18 | Ht 61.0 in | Wt 146.0 lb

## 2021-01-10 DIAGNOSIS — I890 Lymphedema, not elsewhere classified: Secondary | ICD-10-CM | POA: Diagnosis present

## 2021-01-10 DIAGNOSIS — C50811 Malignant neoplasm of overlapping sites of right female breast: Secondary | ICD-10-CM

## 2021-01-10 DIAGNOSIS — M25611 Stiffness of right shoulder, not elsewhere classified: Secondary | ICD-10-CM | POA: Diagnosis present

## 2021-01-10 DIAGNOSIS — Z923 Personal history of irradiation: Secondary | ICD-10-CM | POA: Insufficient documentation

## 2021-01-10 DIAGNOSIS — M79601 Pain in right arm: Secondary | ICD-10-CM | POA: Insufficient documentation

## 2021-01-10 DIAGNOSIS — Z8041 Family history of malignant neoplasm of ovary: Secondary | ICD-10-CM | POA: Insufficient documentation

## 2021-01-10 DIAGNOSIS — Z170 Estrogen receptor positive status [ER+]: Secondary | ICD-10-CM

## 2021-01-10 DIAGNOSIS — Z483 Aftercare following surgery for neoplasm: Secondary | ICD-10-CM | POA: Diagnosis not present

## 2021-01-10 DIAGNOSIS — Z79811 Long term (current) use of aromatase inhibitors: Secondary | ICD-10-CM | POA: Insufficient documentation

## 2021-01-10 DIAGNOSIS — Z803 Family history of malignant neoplasm of breast: Secondary | ICD-10-CM | POA: Diagnosis not present

## 2021-01-10 DIAGNOSIS — J45909 Unspecified asthma, uncomplicated: Secondary | ICD-10-CM | POA: Insufficient documentation

## 2021-01-10 LAB — COMPREHENSIVE METABOLIC PANEL
ALT: 17 U/L (ref 0–44)
AST: 22 U/L (ref 15–41)
Albumin: 4 g/dL (ref 3.5–5.0)
Alkaline Phosphatase: 99 U/L (ref 38–126)
Anion gap: 8 (ref 5–15)
BUN: 11 mg/dL (ref 6–20)
CO2: 26 mmol/L (ref 22–32)
Calcium: 9.3 mg/dL (ref 8.9–10.3)
Chloride: 108 mmol/L (ref 98–111)
Creatinine, Ser: 0.81 mg/dL (ref 0.44–1.00)
GFR, Estimated: >60 mL/min (ref >60)
Glucose, Bld: 94 mg/dL (ref 70–99)
Potassium: 4.1 mmol/L (ref 3.5–5.1)
Sodium: 142 mmol/L (ref 135–145)
Total Bilirubin: 0.7 mg/dL (ref 0.3–1.2)
Total Protein: 6.8 g/dL (ref 6.5–8.1)

## 2021-01-10 LAB — CBC WITH DIFFERENTIAL/PLATELET
Abs Immature Granulocytes: 0 10*3/uL (ref 0.00–0.07)
Basophils Absolute: 0 10*3/uL (ref 0.0–0.1)
Basophils Relative: 1 %
Eosinophils Absolute: 0.4 10*3/uL (ref 0.0–0.5)
Eosinophils Relative: 14 %
HCT: 36.8 % (ref 36.0–46.0)
Hemoglobin: 12 g/dL (ref 12.0–15.0)
Immature Granulocytes: 0 %
Lymphocytes Relative: 35 %
Lymphs Abs: 1 10*3/uL (ref 0.7–4.0)
MCH: 29.9 pg (ref 26.0–34.0)
MCHC: 32.6 g/dL (ref 30.0–36.0)
MCV: 91.8 fL (ref 80.0–100.0)
Monocytes Absolute: 0.3 10*3/uL (ref 0.1–1.0)
Monocytes Relative: 9 %
Neutro Abs: 1.2 10*3/uL — ABNORMAL LOW (ref 1.7–7.7)
Neutrophils Relative %: 41 %
Platelets: 230 10*3/uL (ref 150–400)
RBC: 4.01 MIL/uL (ref 3.87–5.11)
RDW: 12 % (ref 11.5–15.5)
WBC: 2.9 10*3/uL — ABNORMAL LOW (ref 4.0–10.5)
nRBC: 0 % (ref 0.0–0.2)

## 2021-01-10 NOTE — Progress Notes (Signed)
   Subjective:    Patient ID: [name] [name], female    DOB: 10/01/1971, 50 y.o.   MRN: [id number]  The patient is a 50-year-old black female here for a follow-up on her breast surgery.  She had a mammogram in May 2021 which showed some irregularities and led to an ultrasound and a biopsy.  She was found to have right breast invasive ductal carcinoma and ductal carcinoma in situ.  She was estrogen and progesterone positive and HER-2 negative by floor seen in situ hybridization.  She went ahead for a partial mastectomy lumpectomy of the right breast and postop radiation which she finished last week.  She has a little bit of right breast swelling and postradiation changes.  There is no sign of infection.  And there is no sign of dehiscence or scarring deep as of yet.   Review of Systems  Constitutional: Negative.   HENT: Negative.   Eyes: Negative.   Respiratory: Negative.   Cardiovascular: Negative.   Gastrointestinal: Negative.   Endocrine: Negative.   Genitourinary: Negative.   Musculoskeletal: Negative.   Skin: Positive for color change.  Neurological: Negative.   Hematological: Negative.        Objective:   Physical Exam Vitals and nursing note reviewed.  Constitutional:      Appearance: Normal appearance.  HENT:     Head: Normocephalic and atraumatic.  Cardiovascular:     Rate and Rhythm: Normal rate.     Pulses: Normal pulses.  Pulmonary:     Effort: Pulmonary effort is normal.  Abdominal:     General: Abdomen is flat.  Neurological:     General: No focal deficit present.     Mental Status: She is alert. Mental status is at baseline.  Psychiatric:        Mood and Affect: Mood normal.        Behavior: Behavior normal.        Thought Content: Thought content normal.        Assessment & Plan:     ICD-10-CM   1. Malignant neoplasm of overlapping sites of right breast in female, estrogen receptor positive (HCC)  C50.811    Z17.0     I think it will still take  time to settle from the radiation effects.  It will also take time to resolve from the swelling.  She may not need any intervention.  I would like her to come back and see us in the next 6 months and we can talk further.  She had some concerns regarding looking or feeling like she was smaller in her whole body frame.  I think this is more a component of aging than it is related to her breast surgery as she describes it in her overall frame.  Pictures were obtained of the patient and placed in the chart with the patient's or guardian's permission.  

## 2021-01-10 NOTE — Therapy (Signed)
[place], [place], [address] Phone: [phone]   Fax:  [phone]  Physical Therapy Treatment  Patient Details  Name: [name] [name] MRN: [id number] Date of Birth: 12-08-1970 Referring Provider (PT): Dr. Autumn Messing   Encounter Date: 01/10/2021   PT End of Session - 01/10/21 1638    Visit Number 12    PT Start Time 6712    PT Stop Time 1350    PT Time Calculation (min) 45 min    Activity Tolerance Patient tolerated treatment well    Behavior During Therapy [facility] for tasks assessed/performed           Past Medical History:  Diagnosis Date  . Asthma   . Cancer ([place])   . Family history of breast cancer 07/08/2020  . Family history of ovarian cancer 07/08/2020  . Genital herpes     Past Surgical History:  Procedure Laterality Date  . BREAST LUMPECTOMY WITH RADIOACTIVE SEED AND SENTINEL LYMPH NODE BIOPSY Right 07/28/2020   Procedure: RIGHT BREAST LUMPECTOMY X 3  WITH RADIOACTIVE SEED AND SENTINEL LYMPH NODE BIOPSY;  Surgeon: Jovita Kussmaul, MD;  Location: [place];  Service: General;  Laterality: Right;  PEC BLOCK  . FOOT SURGERY Right   . MOUTH SURGERY Right     There were no vitals filed for this visit.   Subjective Assessment - 01/10/21 1355    Subjective Pt is doing well.  She finished her radiation on Feb 17.  She did not has moist desquamation. Her  cording is gone. She is not wearing her sleeve as her LDex has normalized    Pertinent History Patient was diagnosed on 06/27/2020 with right grade I-II invasive ductal carcinoma breast cancer. It is ER/PR positive and HER2 negative with a Ki67 of 2%. Patient reports she underwent a right lumpectomy and sentinel node biopsy (4 negative nodes) on 07/28/2020. She developed a seroma in her right axilla that has been drained and now has cording in her right axilla. She is undergoing chemotherapy and completed radiation on 01/05/2021    Patient Stated Goals to  get rid of  the pulling in her arm and make sure it is ok    Currently in Pain? No/denies              [facility] PT Assessment - 01/10/21 0001      Assessment   Medical Diagnosis s/p right lumpectomy and SLNB    Referring Provider (PT) Dr. Autumn Messing    Onset Date/Surgical Date 07/28/20      Observation/Other Assessments   Observations pt has darkness in right axilla from radiation with visible fullness and inreased pore size in breast.  She has dimpling in right upper arm with palpable guitar string cords in upper arm.  Fullness in axilla with cording there is not present                         [facility] Adult PT Treatment/Exercise - 01/10/21 0001      Exercises   Exercises Other Exercises    Other Exercises  pt instructed to keep current exrcise level the for arms (10#)the same until April 1. After that she can increase the weight to 12 # but decrease reps to 20 and monitor closely for cording or swelling. She should keep that weight for 2 sessions, then increase to 30 reps, and prgress from there slowly . She acknowledged how to progress.  Also  recommended that pt wear her Prarie Hugger compression bra and compression sleeve while exercising.                  PT Education - 01/10/21 1637    Education Details reviewed progressive resisitance exericse progression and recommended pt still wear compression on her arm and trunk while exercising as she continues to have resolving axillary cording    Person(s) Educated Patient    Methods Explanation    Comprehension Verbalized understanding               PT Long Term Goals - 01/10/21 1331      PT LONG TERM GOAL #1   Title Pt will have 150 degrees of right shoulder abduction with no pulling from axillary cording.    Status Achieved      PT LONG TERM GOAL #2   Title Pt will know how to manage the fullness and pain from cording in her right axilla with MLD, compression and exercise    Status Achieved      PT  LONG TERM GOAL #3   Title Pt will have return of L-Dex score to baseling warranting no further treatment for lymphedema    Status Achieved                 Plan - 01/10/21 1638    Clinical Impression Statement Pt has completed her radiation treatment and comes back for a recheck. She has resolved the fullness in her axilla but still has evidence of guitar string axillary cording in her medical upper arm with dimpling of the skin there. She has changes in her breast from radiation with some edema and darkening of the skin.  Pt was instructed to hold off on progressing the resistance of her exercise until April 1 but to continue to do the exercise she has been doing all along. Recommend that she continue to wear her compression bra and sleeve while exercising at least until her cording has completely resolved.  If she does not get resolution of the post radiation swelling and cording by the time she comes back for next Ldex assessment she may need another PT episode with consideration of a Flexitouch.  Will discharge for now as all PT goals have been met and pt knows how to care for herself at home    Personal Factors and Comorbidities Comorbidity 3+    Comorbidities previous lumpectomy and 4 nodes removed, Ongoing chemotherapy    Stability/Clinical Decision Making Stable/Uncomplicated    Rehab Potential Excellent    PT Frequency 2x / week    PT Duration 8 weeks    PT Treatment/Interventions ADLs/Self Care Home Management;Therapeutic exercise;Patient/family education;Manual techniques;Manual lymph drainage;Compression bandaging;Scar mobilization;Passive range of motion    PT Next Visit Plan When she comes for LDex screen Please check for cording in right upper arm and fullness in breast.  If still present, please ask for another script from [facility] and a return to PT for more MLD and possibly Flexitouch??    PT Home Exercise Plan Post op shoulder ROM HEP and closed chain flexion and abduction; end  ROM stretching in doorway with self MFR to cording    Consulted and Agree with Plan of Care Patient           Patient will benefit from skilled therapeutic intervention in order to improve the following deficits and impairments:  Postural dysfunction,Decreased knowledge of precautions,Impaired UE functional use,Pain,Decreased range of motion  Visit Diagnosis: Aftercare following surgery  for neoplasm  Lymphedema, not elsewhere classified  Pain in right arm  Stiffness of right shoulder joint     Problem List Patient Active Problem List   Diagnosis Date Noted  . Family history of ovarian cancer 07/08/2020  . Family history of breast cancer 07/08/2020  . Malignant neoplasm of overlapping sites of right breast in female, estrogen receptor positive ([place]) 07/01/2020  . Murmur 03/09/2014  . Bradycardia 03/09/2014  . Genital herpes    PHYSICAL THERAPY DISCHARGE SUMMARY  Visits from Start of Care: 12  Current functional level related to goals / functional outcomes: independent   Remaining deficits: Axillary cording post radiation swelling and skin changes  in right breast    Education / Equipment: Self MLD, use of compression, home exercises, how to progress resistance exercise  Plan:                                                    Patient goals were met. Patient is being discharged due to meeting the stated rehab goals.  ?????    Donato Heinz. Owens Shark PT  [place]wood Levo 01/10/2021, 4:44 PM  [place], [place], [address] Phone: [phone]   Fax:  [phone]  Name: [name] [name] MRN: [id number] Date of Birth: 1971/09/05

## 2021-01-11 LAB — FOLLICLE STIMULATING HORMONE: FSH: 91.1 m[IU]/mL

## 2021-01-13 ENCOUNTER — Encounter: Payer: Self-pay | Admitting: Oncology

## 2021-01-13 ENCOUNTER — Telehealth: Payer: Self-pay

## 2021-01-13 NOTE — Telephone Encounter (Signed)
Contacted pt regarding my chart message. Pt wondering about her weight and feels like she can see and feel her bones more. Pt is 5 ft 1 in and weighs 140 currently. Assured pt she was of normal weight. Pt acknowledged and stated that she was eating well and exercising regularly. Pt to call office with any further concerns.

## 2021-01-17 LAB — ESTRADIOL, ULTRA SENS: Estradiol, Sensitive: <2.5 pg/mL

## 2021-01-26 ENCOUNTER — Encounter: Payer: Self-pay | Admitting: Oncology

## 2021-01-30 ENCOUNTER — Encounter: Payer: Self-pay | Admitting: Adult Health

## 2021-01-31 ENCOUNTER — Encounter: Payer: Self-pay | Admitting: Radiology

## 2021-02-01 ENCOUNTER — Telehealth: Payer: Self-pay

## 2021-02-01 ENCOUNTER — Other Ambulatory Visit: Payer: Self-pay | Admitting: Adult Health

## 2021-02-01 DIAGNOSIS — E2839 Other primary ovarian failure: Secondary | ICD-10-CM

## 2021-02-01 DIAGNOSIS — C50811 Malignant neoplasm of overlapping sites of right female breast: Secondary | ICD-10-CM

## 2021-02-01 NOTE — Telephone Encounter (Signed)
Nutrition  Message received from LCSW that patient had sent regarding nutrition.  Asking if she can add weight gainer to ensure as she does not think she is getting enough calories.   Called patient and left message with call back number.  Cassidey Barrales B. Zenia Resides, [place], [place] Registered Dietitian [phone] (mobile)

## 2021-02-01 NOTE — Progress Notes (Signed)
Nutrition  Patient called RD back while on lunch break and left message.  RD called patient back and left another message.  Patient currently working and unable to answer phone calls.    RD sent email back to patient addressing questions regarding supplement.  Likely supplement is going to be ok for patient to use.  If appetite is good would encourage eating whole foods first vs taking supplement. Weight has been stable. If patient is wanting to gain muscle mass would encourage patient to speak with certified personal trainer and/or physical therapist on starting exercise program to build muscle mass with MD approval.    RD available as needed.  Devann Cribb B. Zenia Resides, [place], [place] Registered Dietitian [phone] (mobile)

## 2021-02-01 NOTE — Progress Notes (Signed)
Bone density orders placed.

## 2021-02-02 ENCOUNTER — Ambulatory Visit: Payer: Self-pay | Admitting: Radiation Oncology

## 2021-02-03 ENCOUNTER — Encounter: Payer: Self-pay | Admitting: Adult Health

## 2021-02-06 ENCOUNTER — Telehealth: Payer: Self-pay | Admitting: Adult Health

## 2021-02-06 NOTE — Telephone Encounter (Signed)
R/s 4/11 appt per sch msg. Called and left detailed msg. Mailed printout

## 2021-02-07 ENCOUNTER — Telehealth: Payer: Self-pay

## 2021-02-07 NOTE — Telephone Encounter (Signed)
Pt left voicemail requesting to keep appointment on 4/11 with MD that was cancelled.  Pt wants to have 4/11 visit with MD; and survivorship visit with NP on 4/28.   RN left message for call back.

## 2021-02-09 ENCOUNTER — Other Ambulatory Visit: Payer: Self-pay

## 2021-02-09 ENCOUNTER — Ambulatory Visit
Admission: RE | Admit: 2021-02-09 | Discharge: 2021-02-09 | Disposition: A | Discharge status: Home / Self Care | Payer: 59 | Source: Ambulatory Visit | Attending: Radiation Oncology | Admitting: Radiation Oncology

## 2021-02-09 ENCOUNTER — Encounter: Payer: Self-pay | Admitting: Radiation Oncology

## 2021-02-09 DIAGNOSIS — C50811 Malignant neoplasm of overlapping sites of right female breast: Secondary | ICD-10-CM

## 2021-02-09 DIAGNOSIS — Z923 Personal history of irradiation: Secondary | ICD-10-CM | POA: Insufficient documentation

## 2021-02-09 DIAGNOSIS — Z79810 Long term (current) use of selective estrogen receptor modulators (SERMs): Secondary | ICD-10-CM | POA: Insufficient documentation

## 2021-02-09 DIAGNOSIS — R232 Flushing: Secondary | ICD-10-CM | POA: Diagnosis not present

## 2021-02-09 DIAGNOSIS — R61 Generalized hyperhidrosis: Secondary | ICD-10-CM | POA: Insufficient documentation

## 2021-02-09 DIAGNOSIS — Z170 Estrogen receptor positive status [ER+]: Secondary | ICD-10-CM | POA: Insufficient documentation

## 2021-02-09 NOTE — Progress Notes (Incomplete)
  Patient Name: [name] [name] MRN: [id number] DOB: [birth year] Referring Physician: Jovita Kussmaul (Profile Not Attached) Date of Service: 12/29/2020 [facility], [place]                                                        End Of Treatment Note  Diagnoses: C50.811-Malignant neoplasm of overlapping sites of right female breast  Cancer Staging: StageIA (mpT1c, pN0)RightBreastLOQ,Invasive DuctalCarcinoma with DCIS, ER+/ PR+/ Her2-, Grade2  Intent: Curative  Radiation Treatment Dates: 11/30/2020 through 12/29/2020  Site: Right breast Technique: 3D Total Dose (Gy): 40.05/40.05 Dose per Fx (Gy): 2.67 Completed Fx: 15/15 Beam Energies: 6X  Site: Right breast boost Technique: 3D Total Dose (Gy): 10/10 Dose per Fx (Gy): 2 Completed Fx: 5/5 Beam Energies: 6X, 10X  Narrative: The patient tolerated radiation therapy quite well. She did report some slight swelling under the right arm and of the right breast without itching or discomfort. Towards the end of treatment, she also reported some tingling in her arm and fingers and mild fatigue. Throughout treatment, there were some mild hyperpigmentation changes and mild swelling to the right breast.  Plan: The patient will follow-up with radiation oncology in one month.  ________________________________________________   Blair Promise, PhD, MD  This document serves as a record of services personally performed by Gery Pray, MD. It was created on his behalf by Clerance Lav, a trained medical scribe. The creation of this record is based on the scribe's personal observations and the provider's statements to them. This document has been checked and approved by the attending provider.

## 2021-02-09 NOTE — Progress Notes (Signed)
Radiation Oncology         [phone] ________________________________  Name: [name] [name] MRN: [id number]  Date: 02/09/2021  DOB: [birth year]  Follow-Up Visit Note  CC: [name], [name], MD  Jovita Kussmaul, MD    ICD-10-CM   1. Malignant neoplasm of overlapping sites of right breast in female, estrogen receptor positive ([place])  C50.811    Z17.0     Diagnosis: StageIA (mpT1c,pN0)RightBreastLOQ,Invasive DuctalCarcinoma with DCIS, ER+/ PR+/ Her2-, Grade2  Interval Since Last Radiation: One month and two weeks  Radiation Treatment Dates: 11/30/2020 through 12/29/2020  Site: Right breast Technique: 3D Total Dose (Gy): 40.05/40.05 Dose per Fx (Gy): 2.67 Completed Fx: 15/15 Beam Energies: 6X  Site: Right breast boost Technique: 3D Total Dose (Gy): 10/10 Dose per Fx (Gy): 2 Completed Fx: 5/5 Beam Energies: 6X, 10X  Narrative:  The patient returns today for routine follow-up. She was last seen by Dr. Jana Hakim on 01/10/2021, at which time she was started on Tamoxifen.   On review of systems, she reports noting some hot flashes and night sweats since starting on tamoxifen.  She also notes what she describes as her muscle mass decreasing and she is quite concerned about this issue.  She continues to exercise regularly.  She reports her clothes are not fitting as well at this time. She denies discomfort  in the right breast or itching.  She denies any nipple discharge or bleeding.  She denies any problems with swelling in her right arm or hand.  She overall is quite pleased with how well she is healed up since her radiation therapy.                           ALLERGIES:  is allergic to keflex [cephalexin] and shellfish allergy.  Meds: Current Outpatient Medications  Medication Sig Dispense Refill  . tamoxifen (NOLVADEX) 20 MG tablet Take 20 mg by mouth daily.    Marland Kitchen ibuprofen (ADVIL) 200 MG tablet Take 200-400 mg by mouth every 6 (six) hours as needed for headache or  moderate pain.     . Tetrahydrozoline HCl (VISINE OP) Place 1 drop into both eyes daily as needed (redness).      No current facility-administered medications for this encounter.    Physical Findings: The patient is in no acute distress. Patient is alert and oriented.  height is _0  (1.549 m) and weight is 139 lb 8 oz (63.3 kg). Her temporal temperature is 97.1 F (36.2 C) (abnormal). Her blood pressure is 113/79 and her pulse is 78. Her respiration is 18 and oxygen saturation is 98%.   Lungs are clear to auscultation bilaterally. Heart has regular rate and rhythm. No palpable cervical, supraclavicular, or axillary adenopathy. Abdomen soft, non-tender, normal bowel sounds. Left breast: No palpable mass, nipple discharge, or bleeding. Right breast: Mild hyperpigmentation changes noted.  Skin is healed very well.  Minimal swelling at this time.  No nipple discharge or bleeding.  No dominant mass appreciated in the breast.  Good range of movement in both arms and shoulders  Lab Findings: Lab Results  Component Value Date   WBC 2.9 (L) 01/10/2021   HGB 12.0 01/10/2021   HCT 36.8 01/10/2021   MCV 91.8 01/10/2021   PLT 230 01/10/2021    Radiographic Findings: No results found.  Impression: StageIA (mpT1c,pN0)RightBreastLOQ,Invasive DuctalCarcinoma with DCIS, ER+/ PR+/ Her2-, Grade2   She has recovered well from her radiation therapy.  She continues to work full-time  and continues to exercise regularly.  As above the patient is quite concerned that she is losing muscle mass and wonders if this is related to her breast cancer or possibly related to her tamoxifen.  I recommended she discuss this with Dr. Jana Hakim.  Plan: The patient is scheduled to follow up with Wilber Bihari, NP, on 03/16/2021. She wishes to follow up with radiation oncology in 3 months.  She would also like to meet with Dr. Jana Hakim in person to discuss above-mentioned issues in  detail.    ____________________________________   Blair Promise, PhD, MD  This document serves as a record of services personally performed by Gery Pray, MD. It was created on his behalf by Clerance Lav, a trained medical scribe. The creation of this record is based on the scribe's personal observations and the provider's statements to them. This document has been checked and approved by the attending provider.

## 2021-02-09 NOTE — Progress Notes (Addendum)
[name] [name] is here today for follow up post radiation to the breast.   Breast Side Right breast   They completed their radiation on 12/29/20  Does the patient complain of any of the following: . Post radiation skin issues: Denies any skin issues . Breast Tenderness: no . Breast Swelling: no . Lymphadema: no . Range of Motion limitations no limitations,  . Fatigue post radiation:reports great energy level . Appetite good/fair/poor: fair, reports increased weight loss. Reports taking a weight gainer called critical mass, ( whole food carb)    Additional comments if applicable:patient reports having a shooting pain to left arm. Patient reports pain subsided without treatment.

## 2021-02-10 ENCOUNTER — Encounter: Payer: Self-pay | Admitting: Oncology

## 2021-02-13 ENCOUNTER — Other Ambulatory Visit: Payer: Self-pay | Admitting: Oncology

## 2021-02-13 ENCOUNTER — Telehealth: Payer: Self-pay | Admitting: Oncology

## 2021-02-13 NOTE — Progress Notes (Signed)
[facility]  [phone] [phone]     ID: [name] [name] DOB: [birth year]  MR#: [id number]  WCB#:[id number]  Patient Care Team: [name] Nip, MD as PCP - General (Family Medicine) [name] Kaufmann, RN as Oncology Nurse Navigator [name] Germany, RN as Oncology Nurse Navigator [name] Kussmaul, MD as Consulting Physician (General Surgery) [name] [name], Virgie Dad, MD as Consulting Physician (Oncology) [name] Pray, MD as Consulting Physician (Radiation Oncology) [name] Pupa, DO as Consulting Physician (Obstetrics and Gynecology) [name] Pong, MD as Referring Physician (Hematology and Oncology) [name] Cruel, MD OTHER MD:  CHIEF COMPLAINT: Estrogen receptor positive breast cancer  CURRENT TREATMENT: To start tamoxifen   INTERVAL HISTORY: [name] [name] returns today for follow-up of her estrogen positive breast cancer accompanied by her husband [name] [name].    She was started on tamoxifen at her last visit on 01/10/2021.  She met with Dr. Dorathy Daft 02/09/2021 for routine follow-up.  She was concerned that her muscle mass was decreasing even though she continues to exercise regularly.  Her clothes do not fit.  She was scheduled for a visit today to discuss that further.  For reference her weight today is 243 pounds and 8 ounces.  This is a 4 pounds more than in March and 3 pounds less than February.  She has been in this range since December 2021.  Prior to that she was in the 147-150 range.   REVIEW OF SYSTEMS: [name] [name] is exercising regularly, but just beginning to do upper body.  She was surprised to see that her upper body really looked more frail than she remembered.  In addition she has developed some metallic taste and she wonders why that came back.  It had resolved after chemotherapy.  That is not something that tamoxifen generally causes.  She is on a supplement which consists most mostly of 300 cal that she uses together with Ensure.   There are no strange additives and it it some protein carbs and oils together with some vitamins.  Aside from these issues a detailed review of systems is noncontributory and she is working full-time.   COVID 19 VACCINATION STATUS: s/p Pfizer x 2, last dose July   HISTORY OF CURRENT ILLNESS: From the original intake note:  [name] [name] herself noted a change in her right breast when exercising.  She thinks she first noticed this sometime in April 2021.  It was like an indentation when she AB duct at the right upper extremity.  Screening mammogram from 04/15/2020 was negative, with breast density category C.  However the problem became more of a palpable nontender lump and after evaluation by her primary care physician she underwent right breast ultrasonography at [facility] on 06/23/2020 showing: 1.6 cm mass in right breast at 8 o'clock; 1.2 cm mass in right breast at 9 o'clock; the masses are 1.5 cm apart; single indeterminate mildly abnormal node in right axilla.  Accordingly on 06/27/2020 she proceeded to biopsy of the right breast areas in question. The pathology from this procedure ([id number]) showed: invasive and in situ mammary carcinoma, e-cadherin positive, grade 1-2. Both masses showed similar morphology. Prognostic indicators significant for: estrogen receptor, 100% positive with strong staining intensity and progesterone receptor, 90% positive with moderate-weak staining intensity. Proliferation marker Ki67 at 2%. HER2 equivocal by immunohistochemistry (2+), but negative by fluorescent in situ hybridization with a signals ratio 1.47 and number per cell 2.5.  The patient's subsequent history is as detailed below.    PAST MEDICAL HISTORY: Past  Medical History:  Diagnosis Date  . Asthma   . Cancer ([place])   . Family history of breast cancer 07/08/2020  . Family history of ovarian cancer 07/08/2020  . Genital herpes   . History of radiation therapy 12/01/2020-12/29/2020   right breast   Dr [name] [name]    PAST SURGICAL HISTORY: Past Surgical History:  Procedure Laterality Date  . BREAST LUMPECTOMY WITH RADIOACTIVE SEED AND SENTINEL LYMPH NODE BIOPSY Right 07/28/2020   Procedure: RIGHT BREAST LUMPECTOMY X 3  WITH RADIOACTIVE SEED AND SENTINEL LYMPH NODE BIOPSY;  Surgeon: [name] Kussmaul, MD;  Location: [facility];  Service: General;  Laterality: Right;  PEC BLOCK  . FOOT SURGERY Right   . MOUTH SURGERY Right     FAMILY HISTORY: Family History  Problem Relation Age of Onset  . CVA Other   . Ovarian cancer Sister 21  . Breast cancer Other        maternal grandmother's sister, dx 109s, d. 79s   Her father is 26, and her mother 74 as of 06/2020. [name] [name] has 3 brothers and 4 sisters. She reports ovarian cancer in her sister at age 47 (sister is doing well now) and breast cancer in a maternal great-aunt (post-menopausal).   GYNECOLOGIC HISTORY:  Last menstrual period August 2021  Menarche: 50 years old Age at first live birth: 50 years old [place] P 1 LMP periods are irregular Contraceptive: Patient's husband is status post vasectomy HRT n/a  Hysterectomy? no BSO? no   SOCIAL HISTORY: (updated 06/2020)  [name] [name] work as a Dentist. Husband [name] [name] "[name] [name]" is a Airline pilot. She lives at home with [name] [name]. Daughter [name] [name], age 33, is a Education administrator here in [place]. [name] [name] attends [employer].    ADVANCED DIRECTIVES: In the absence of any documentation to the contrary, the patient's spouse is their HCPOA.    HEALTH MAINTENANCE: Social History   Tobacco Use  . Smoking status: Never Smoker  . Smokeless tobacco: Never Used  Vaping Use  . Vaping Use: Never used  Substance Use Topics  . Alcohol use: Yes    Comment: social  . Drug use: No     Colonoscopy: n/a (age)  PAP: 01/2020  Bone density: n/a (age)   Allergies  Allergen Reactions  . Keflex [Cephalexin] Itching  . Shellfish Allergy     Throat and ear itching     Current Outpatient Medications   Medication Sig Dispense Refill  . ibuprofen (ADVIL) 200 MG tablet Take 200-400 mg by mouth every 6 (six) hours as needed for headache or moderate pain.     . tamoxifen (NOLVADEX) 20 MG tablet Take 20 mg by mouth daily.    . Tetrahydrozoline HCl (VISINE OP) Place 1 drop into both eyes daily as needed (redness).      No current facility-administered medications for this visit.    OBJECTIVE: African-American woman who appears younger than stated age  37:   02/14/21 1342  BP: 106/65  Pulse: 79  Resp: 18  Temp: 97.9 F (36.6 C)  SpO2: 100%     Body mass index is 27.11 kg/m.   Wt Readings from Last 3 Encounters:  02/14/21 143 lb 8 oz (65.1 kg)  02/09/21 139 lb 8 oz (63.3 kg)  01/10/21 146 lb (66.2 kg)      ECOG FS:1 - Symptomatic but completely ambulatory  Sclerae unicteric, EOMs intact Wearing a mask No cervical or supraclavicular adenopathy Lungs no rales or rhonchi Heart regular rate and  rhythm Abd soft, nontender, positive bowel sounds MSK no focal spinal tenderness, no upper extremity lymphedema Neuro: nonfocal, well oriented, appropriate affect Breasts: The right breast is status post lumpectomy and radiation.  There is no evidence of local recurrence.  Left breast is benign.  Both axillae are benign   LAB RESULTS:  CMP     Component Value Date/Time   NA 142 01/10/2021 1036   K 4.1 01/10/2021 1036   CL 108 01/10/2021 1036   CO2 26 01/10/2021 1036   GLUCOSE 94 01/10/2021 1036   BUN 11 01/10/2021 1036   CREATININE 0.81 01/10/2021 1036   CREATININE 0.91 08/03/2020 1536   CALCIUM 9.3 01/10/2021 1036   PROT 6.8 01/10/2021 1036   ALBUMIN 4.0 01/10/2021 1036   AST 22 01/10/2021 1036   AST 47 (H) 08/03/2020 1536   ALT 17 01/10/2021 1036   ALT 58 (H) 08/03/2020 1536   ALKPHOS 99 01/10/2021 1036   BILITOT 0.7 01/10/2021 1036   BILITOT 0.4 08/03/2020 1536   GFRNONAA >60 01/10/2021 1036   GFRNONAA >60 08/03/2020 1536   GFRAA >60 08/03/2020 1536    No  results found for: TOTALPROTELP, ALBUMINELP, A1GS, A2GS, BETS, BETA2SER, GAMS, MSPIKE, SPEI  Lab Results  Component Value Date   WBC 3.7 (L) 02/14/2021   NEUTROABS 1.5 (L) 02/14/2021   HGB 12.5 02/14/2021   HCT 37.1 02/14/2021   MCV 90.0 02/14/2021   PLT 264 02/14/2021    No results found for: LABCA2  No components found for: [id number]  No results for input(s): INR in the last 168 hours.  No results found for: LABCA2  No results found for: FWY637  No results found for: CHY850  No results found for: YDX412  No results found for: CA2729  No components found for: HGQUANT  No results found for: CEA1 / No results found for: CEA1   No results found for: AFPTUMOR  No results found for: CHROMOGRNA  No results found for: KPAFRELGTCHN, LAMBDASER, KAPLAMBRATIO (kappa/lambda light chains)  No results found for: HGBA, HGBA2QUANT, HGBFQUANT, HGBSQUAN (Hemoglobinopathy evaluation)   No results found for: LDH  No results found for: IRON, TIBC, IRONPCTSAT (Iron and TIBC)  No results found for: FERRITIN  Urinalysis    Component Value Date/Time   COLORURINE YELLOW 11/16/2020 1923   APPEARANCEUR HAZY (A) 11/16/2020 1923   LABSPEC 1.013 11/16/2020 1923   PHURINE 7.0 11/16/2020 1923   GLUCOSEU NEGATIVE 11/16/2020 1923   HGBUR NEGATIVE 11/16/2020 [place] NEGATIVE 11/16/2020 [place] NEGATIVE 11/16/2020 1923   PROTEINUR NEGATIVE 11/16/2020 1923   NITRITE NEGATIVE 11/16/2020 1923   LEUKOCYTESUR TRACE (A) 11/16/2020 1923    STUDIES: No results found.    ELIGIBLE FOR AVAILABLE RESEARCH PROTOCOL: AET  ASSESSMENT: 50 y.o. [place] woman status post right breast biopsy x2 on 06/27/2020 for an mT1c N0, stage IA invasive ductal carcinoma, grade 1 or 2, estrogen and progesterone receptor positive, HER-2 not amplified, with an MIB-1 of 2%.  (a) and equivocal right axillary lymph node was biopsied 06/27/2020 and was benign  (b) chest CT scan 07/08/2020  showed no evidence of metastatic disease  (1) genetics testing recommended on 07/07/2020 but declined by patient  (2)  S/p right lumpectomy 07/28/2020 for an  mpT1c pN0, stage IA invasive ductal carcinoma, grade 2, with negative margins   (a) a total of 4 right axillary lymph nodes removed, all clear  (3) Oncotype score of 28 predicts a risk of this breast cancer recurring outside  the breast within the next 9 years of 17% if the patient's only systemic therapy is antiestrogens for 5 years.  It also predicts a significant benefit from chemotherapy.  (4) adjuvant chemotherapy consisting of docetaxel and cyclophosphamide given every 21 days x 4 started 09/02/2020, completed 11/04/2020 [facility])  (5) adjuvant radiation completed 12/29/2020  (6) tamoxifen started 01/10/2021  (a) consider goserelin/anastrozole if menstruation resumes post chemo   PLAN: Alechia is now 1/2-year out from definitive surgery for her breast cancer.  I do not find any evidence or symptom that would suggest recurrent disease.  I think what she is experiencing is the delayed effects of her very intensive treatments and deconditioning that occurred during that time.  I think she is doing exactly the right thing, working out as much as she can and being as normal as she can.  I am sure that within 6 to 18 months she will notice a change in her body that will be a little bit closer to their way was previously.  As she knows from the finding her new normal group, we do not return to the same normal but we do obtain a new normal and she is on the way.  She wondered if she should have a CT scan of the chest just for reassurance.  I gave her a copy of her CT of the chest from August of last year and we discussed the radiation exposure cost and false positives that can occur when we obtained scans in the absence of specific symptoms to evaluate.  After the discussion she was comfortable with follow-up as we have been doing.  More  specifically she will have mammography and a bone density mid June and see me in mid July after she returns from a yearly trip she has with her sisters to [place].  She knows to call for any other issue that may develop before the next visit  Total encounter time 25 minutes.Sarajane Jews C. Magrinat, MD 02/14/21 2:10 PM Medical Oncology and Hematology [facility] [place], [place] [address] Tel. [phone]    Fax. [phone]   I, Wilburn Mylar, am acting as scribe for Dr. Virgie Dad. Magrinat.  I, Lurline Del MD, have reviewed the above documentation for accuracy and completeness, and I agree with the above.   *Total Encounter Time as defined by the Centers for Medicare and Medicaid Services includes, in addition to the face-to-face time of a patient visit (documented in the note above) non-face-to-face time: obtaining and reviewing outside history, ordering and reviewing medications, tests or procedures, care coordination (communications with other health care professionals or caregivers) and documentation in the medical record.

## 2021-02-13 NOTE — Telephone Encounter (Signed)
Scheduled appt per 3/28 sch msg. Called pt, no answer. Left msg with appt date and time.

## 2021-02-14 ENCOUNTER — Other Ambulatory Visit: Payer: Self-pay

## 2021-02-14 ENCOUNTER — Inpatient Hospital Stay: Payer: 59 | Attending: Oncology

## 2021-02-14 ENCOUNTER — Inpatient Hospital Stay: Payer: 59 | Admitting: Oncology

## 2021-02-14 VITALS — BP 106/65 | HR 79 | Temp 97.9°F | Resp 18 | Ht 61.0 in | Wt 143.5 lb

## 2021-02-14 DIAGNOSIS — Z923 Personal history of irradiation: Secondary | ICD-10-CM | POA: Diagnosis not present

## 2021-02-14 DIAGNOSIS — Z79810 Long term (current) use of selective estrogen receptor modulators (SERMs): Secondary | ICD-10-CM | POA: Insufficient documentation

## 2021-02-14 DIAGNOSIS — C50811 Malignant neoplasm of overlapping sites of right female breast: Secondary | ICD-10-CM

## 2021-02-14 DIAGNOSIS — Z803 Family history of malignant neoplasm of breast: Secondary | ICD-10-CM | POA: Diagnosis not present

## 2021-02-14 DIAGNOSIS — Z8041 Family history of malignant neoplasm of ovary: Secondary | ICD-10-CM | POA: Insufficient documentation

## 2021-02-14 DIAGNOSIS — Z170 Estrogen receptor positive status [ER+]: Secondary | ICD-10-CM | POA: Insufficient documentation

## 2021-02-14 DIAGNOSIS — Z79899 Other long term (current) drug therapy: Secondary | ICD-10-CM | POA: Diagnosis not present

## 2021-02-14 DIAGNOSIS — J45909 Unspecified asthma, uncomplicated: Secondary | ICD-10-CM | POA: Diagnosis not present

## 2021-02-14 LAB — CBC WITH DIFFERENTIAL/PLATELET
Abs Immature Granulocytes: 0.01 10*3/uL (ref 0.00–0.07)
Basophils Absolute: 0 10*3/uL (ref 0.0–0.1)
Basophils Relative: 1 %
Eosinophils Absolute: 0.1 10*3/uL (ref 0.0–0.5)
Eosinophils Relative: 2 %
HCT: 37.1 % (ref 36.0–46.0)
Hemoglobin: 12.5 g/dL (ref 12.0–15.0)
Immature Granulocytes: 0 %
Lymphocytes Relative: 45 %
Lymphs Abs: 1.7 10*3/uL (ref 0.7–4.0)
MCH: 30.3 pg (ref 26.0–34.0)
MCHC: 33.7 g/dL (ref 30.0–36.0)
MCV: 90 fL (ref 80.0–100.0)
Monocytes Absolute: 0.4 10*3/uL (ref 0.1–1.0)
Monocytes Relative: 12 %
Neutro Abs: 1.5 10*3/uL — ABNORMAL LOW (ref 1.7–7.7)
Neutrophils Relative %: 40 %
Platelets: 264 10*3/uL (ref 150–400)
RBC: 4.12 MIL/uL (ref 3.87–5.11)
RDW: 12.1 % (ref 11.5–15.5)
WBC: 3.7 10*3/uL — ABNORMAL LOW (ref 4.0–10.5)
nRBC: 0 % (ref 0.0–0.2)

## 2021-02-14 LAB — COMPREHENSIVE METABOLIC PANEL
ALT: 16 U/L (ref 0–44)
AST: 25 U/L (ref 15–41)
Albumin: 4 g/dL (ref 3.5–5.0)
Alkaline Phosphatase: 107 U/L (ref 38–126)
Anion gap: 10 (ref 5–15)
BUN: 15 mg/dL (ref 6–20)
CO2: 27 mmol/L (ref 22–32)
Calcium: 8.7 mg/dL — ABNORMAL LOW (ref 8.9–10.3)
Chloride: 104 mmol/L (ref 98–111)
Creatinine, Ser: 0.89 mg/dL (ref 0.44–1.00)
GFR, Estimated: >60 mL/min (ref >60)
Glucose, Bld: 104 mg/dL — ABNORMAL HIGH (ref 70–99)
Potassium: 4 mmol/L (ref 3.5–5.1)
Sodium: 141 mmol/L (ref 135–145)
Total Bilirubin: 0.4 mg/dL (ref 0.3–1.2)
Total Protein: 7 g/dL (ref 6.5–8.1)

## 2021-02-15 ENCOUNTER — Encounter: Payer: Self-pay | Admitting: Oncology

## 2021-02-16 ENCOUNTER — Telehealth: Payer: Self-pay | Admitting: Oncology

## 2021-02-16 NOTE — Telephone Encounter (Signed)
Scheduled per 3/29 los. Called and spoke with pt, confirmed 7/18 appts

## 2021-02-27 ENCOUNTER — Inpatient Hospital Stay: Payer: 59 | Admitting: Oncology

## 2021-02-27 ENCOUNTER — Telehealth: Payer: 59 | Admitting: Oncology

## 2021-03-13 ENCOUNTER — Ambulatory Visit: Payer: 59 | Attending: General Surgery

## 2021-03-13 ENCOUNTER — Other Ambulatory Visit: Payer: Self-pay

## 2021-03-13 DIAGNOSIS — Z483 Aftercare following surgery for neoplasm: Secondary | ICD-10-CM

## 2021-03-13 NOTE — Therapy (Signed)
[place], [place], [address] Phone: [phone]   Fax:  [phone]  Physical Therapy Treatment  Patient Details  Name: [name] [name] MRN: [id number] Date of Birth: 1971-10-12 Referring Provider (PT): Dr. Autumn Messing   Encounter Date: 03/13/2021   PT End of Session - 03/13/21 0826    Visit Number 12   # unchanged due to screen only   Number of Visits 25    Date for PT Re-Evaluation 01/03/21    PT Start Time 0817    PT Stop Time 0824    PT Time Calculation (min) 7 min    Activity Tolerance Patient tolerated treatment well    Behavior During Therapy [facility] for tasks assessed/performed           Past Medical History:  Diagnosis Date  . Asthma   . Cancer ([place])   . Family history of breast cancer 07/08/2020  . Family history of ovarian cancer 07/08/2020  . Genital herpes   . History of radiation therapy 12/01/2020-12/29/2020   right breast   Dr Gery Pray    Past Surgical History:  Procedure Laterality Date  . BREAST LUMPECTOMY WITH RADIOACTIVE SEED AND SENTINEL LYMPH NODE BIOPSY Right 07/28/2020   Procedure: RIGHT BREAST LUMPECTOMY X 3  WITH RADIOACTIVE SEED AND SENTINEL LYMPH NODE BIOPSY;  Surgeon: Jovita Kussmaul, MD;  Location: [facility];  Service: General;  Laterality: Right;  PEC BLOCK  . FOOT SURGERY Right   . MOUTH SURGERY Right     There were no vitals filed for this visit.   Subjective Assessment - 03/13/21 0825    Subjective Pt returns for her 3 month L-Dex screen. 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 It is ER/PR positive and HER2 negative with a Ki67 of 2%. Patient reports she underwent a right lumpectomy and sentinel node biopsy (4 negative nodes) on 07/28/2020. She developed a seroma in her right axilla that has been drained and now has cording  in her right axilla. She is undergoing chemotherapy and completed radiation on 01/05/2021                  L-DEX FLOWSHEETS - 03/13/21 0800      L-DEX LYMPHEDEMA SCREENING   Measurement Type Unilateral    L-DEX MEASUREMENT EXTREMITY Upper Extremity    POSITION  Standing    DOMINANT SIDE Right    At Risk Side Right    BASELINE SCORE (UNILATERAL) -0.2    L-DEX SCORE (UNILATERAL) 1.9    VALUE CHANGE (UNILAT) 2.1                                  PT Long Term Goals - 01/10/21 1331      PT LONG TERM GOAL #1   Title Pt will have 150 degrees of right shoulder abduction with no pulling from axillary cording.    Status Achieved      PT LONG TERM GOAL #2   Title Pt will know how to manage the fullness and pain from cording in her right axilla with MLD, compression and exercise    Status Achieved      PT LONG TERM GOAL #3   Title Pt will have return of L-Dex score to baseling warranting  no further treatment for lymphedema    Status Achieved                 Plan - 03/13/21 0826    Clinical Impression Statement Pt returns for her 3 month L-Dex screen. Her chnage from baseline of 2.1 is WNLs so no further treatment is required at this time except to cont every 3 month L-Dex screens which pt is agreeable to.    PT Next Visit Plan Cont every 3 month L-Dex screens for up to 2 years from her SLNB.    Consulted and Agree with Plan of Care Patient           Patient will benefit from skilled therapeutic intervention in order to improve the following deficits and impairments:     Visit Diagnosis: Aftercare following surgery for neoplasm     Problem List Patient Active Problem List   Diagnosis Date Noted  . Family history of ovarian cancer 07/08/2020  . Family history of breast cancer 07/08/2020  . Malignant neoplasm of overlapping sites of right breast in female, estrogen receptor positive ([place]) 07/01/2020  . Murmur 03/09/2014  . Bradycardia  03/09/2014  . Genital herpes     [name], PTA 03/13/2021, 8:29 AM  [place], [place], [address] Phone: [phone]   Fax:  [phone]  Name: Cylee Dattilo MRN: [id number] Date of Birth: 1971/08/09

## 2021-03-16 ENCOUNTER — Inpatient Hospital Stay: Payer: 59 | Attending: Oncology | Admitting: Adult Health

## 2021-03-16 NOTE — Progress Notes (Signed)
Patient did not show for appointment.  She was called by our nurse.  Letter mailed

## 2021-03-17 ENCOUNTER — Telehealth: Payer: Self-pay | Admitting: Adult Health

## 2021-03-17 NOTE — Telephone Encounter (Signed)
R/s appt per 4/29 sch msg. Called pt, no answer. Left msg with appt date and time.

## 2021-03-18 ENCOUNTER — Encounter: Payer: Self-pay | Admitting: Adult Health

## 2021-03-20 ENCOUNTER — Telehealth: Payer: Self-pay

## 2021-03-20 NOTE — Telephone Encounter (Signed)
Patient presented as walk in for [facility] with c/o metallic taste in mouth, headache, and right-sided soreness. For metallic taste, RN advised her to use biotene mouthwash and increase fluid intake. For headache, RN advised her to take acetaminophen. Patient c/o soreness at surgical site on right side, said that she had recently been exercising more of her upper body. RN advised that the acetaminophen should help this, but to call if she did not experience relief or if symptoms worsened. Patient has appt with Wilber Bihari, NP, on 5/12.

## 2021-03-26 ENCOUNTER — Other Ambulatory Visit: Payer: Self-pay | Admitting: Oncology

## 2021-03-27 ENCOUNTER — Encounter: Payer: Self-pay | Admitting: Adult Health

## 2021-03-28 ENCOUNTER — Telehealth: Payer: Self-pay | Admitting: Oncology

## 2021-03-28 NOTE — Telephone Encounter (Signed)
R/s appt per 5/8 sch msg. Pt aware.  

## 2021-03-28 NOTE — Telephone Encounter (Signed)
R/s appt per 5/10 sch msg. Pt aware.  

## 2021-03-30 ENCOUNTER — Telehealth: Payer: Self-pay

## 2021-03-30 ENCOUNTER — Inpatient Hospital Stay: Payer: 59 | Attending: Oncology | Admitting: Adult Health

## 2021-03-30 ENCOUNTER — Other Ambulatory Visit: Payer: Self-pay

## 2021-03-30 ENCOUNTER — Encounter: Payer: Self-pay | Admitting: Adult Health

## 2021-03-30 ENCOUNTER — Inpatient Hospital Stay: Payer: 59

## 2021-03-30 VITALS — BP 115/69 | HR 65 | Temp 97.5°F | Resp 18 | Ht 61.0 in | Wt 140.9 lb

## 2021-03-30 DIAGNOSIS — G620 Drug-induced polyneuropathy: Secondary | ICD-10-CM | POA: Insufficient documentation

## 2021-03-30 DIAGNOSIS — Z1211 Encounter for screening for malignant neoplasm of colon: Secondary | ICD-10-CM

## 2021-03-30 DIAGNOSIS — C50811 Malignant neoplasm of overlapping sites of right female breast: Secondary | ICD-10-CM | POA: Insufficient documentation

## 2021-03-30 DIAGNOSIS — Z170 Estrogen receptor positive status [ER+]: Secondary | ICD-10-CM

## 2021-03-30 DIAGNOSIS — Z923 Personal history of irradiation: Secondary | ICD-10-CM | POA: Insufficient documentation

## 2021-03-30 DIAGNOSIS — R350 Frequency of micturition: Secondary | ICD-10-CM | POA: Diagnosis not present

## 2021-03-30 LAB — URINALYSIS, COMPLETE (UACMP) WITH MICROSCOPIC
Bacteria, UA: NONE SEEN
Bilirubin Urine: NEGATIVE
Glucose, UA: NEGATIVE mg/dL
Hgb urine dipstick: NEGATIVE
Ketones, ur: NEGATIVE mg/dL
Leukocytes,Ua: NEGATIVE
Nitrite: NEGATIVE
Protein, ur: NEGATIVE mg/dL
Specific Gravity, Urine: 1.018 (ref 1.005–1.030)
pH: 5 (ref 5.0–8.0)

## 2021-03-30 NOTE — Telephone Encounter (Signed)
I left a message on the patient's VM per DPR, to inform her of her normal UA results. I advised the patient to contact us if she had any questions and to reach out to [place] in the next couple of weeks if she was still having any urinary frequency so that we could refer her to urology.   Gardenia Phlegm, NP  P Chcc Bc 4  Urine looks good. If still having issues with urinary frequency over the next couple of weeks, we can refer to urology.   Thanks,  [place]

## 2021-03-30 NOTE — Progress Notes (Signed)
SURVIVORSHIP VISIT:   BRIEF ONCOLOGIC HISTORY:  Oncology History  Malignant neoplasm of overlapping sites of right breast in female, estrogen receptor positive ([place])  07/01/2020 Initial Diagnosis   Malignant neoplasm of overlapping sites of right breast in female, estrogen receptor positive ([place])   07/28/2020 Cancer Staging   Staging form: Breast, AJCC 8th Edition - Pathologic stage from 07/28/2020: Stage IA (pT1c, pN0, cM0, G2, ER+, PR+, HER2-, Oncotype DX score: 28)   07/28/2020 Surgery   Right lumpectomy Marlou Starks) [phone]): multifocal IDC, 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 Margin were negative. 4 lymph nodes were negative.   08/10/2020 Oncotype testing   The Oncotype DX score was 28 predicting a risk of outside the breast recurrence over the next 9 years of 17% if the patient's only systemic therapy is tamoxifen for 5 years.    09/02/2020 - 11/04/2020 Chemotherapy   adjuvant chemotherapy consisting of docetaxel and cyclophosphamide given every 21 days x 4 started 09/02/2020, completed 11/04/2020 [facility])   11/30/2020 - 12/29/2020 Radiation Therapy   The patient initially received a dose of 40.05 Gy in 15 fractions to the breast using whole-breast tangent fields. This was delivered using a 3-D conformal technique. The pt received a boost delivering an additional 10 Gy in 5 fractions using a electron boost with 46mV electrons. The total dose was 50.05 Gy.   12/2020 -  Anti-estrogen oral therapy   Tamoxifen     INTERVAL HISTORY:  [name] [name] review her survivorship care plan detailing her treatment course for breast cancer, as well as monitoring long-term side effects of that treatment, education regarding health maintenance, screening, and overall wellness and health promotion.     Overall, [name] [name] reports feeling quite well.  She continues on Tamoxifen.  She denies any significant issues, however notes that she has some urinary frequency.    She also notes some mild  residual peripheral neuropathy in her fingertips and toes from chemotherapy, however this is not problematic for her.    REVIEW OF SYSTEMS:  Review of Systems  Constitutional: Negative for appetite change, chills, fatigue, fever and unexpected weight change.  HENT:   Negative for hearing loss, lump/mass and trouble swallowing.   Eyes: Negative for eye problems and icterus.  Respiratory: Negative for chest tightness, cough and shortness of breath.   Cardiovascular: Negative for chest pain, leg swelling and palpitations.  Gastrointestinal: Negative for abdominal distention, abdominal pain, constipation, diarrhea, nausea and vomiting.  Endocrine: Negative for hot flashes.  Genitourinary: Negative for difficulty urinating.   Musculoskeletal: Negative for arthralgias.  Skin: Negative for itching and rash.  Neurological: Negative for dizziness, extremity weakness, headaches and numbness.  Hematological: Negative for adenopathy. Does not bruise/bleed easily.  Psychiatric/Behavioral: Negative for depression. The patient is not nervous/anxious.   Breast: Denies any new nodularity, masses, tenderness, nipple changes, or nipple discharge.    ONCOLOGY TREATMENT TEAM:  1. Sur[facility]  [facility] 2. Medical Oncologist: Dr. MJana Hakim 3. Radiation Oncologist: Dr. KSondra Come   PAST MEDICAL/SURGICAL HISTORY:  Past Medical History:  Diagnosis Date  . Asthma   . [place]   . Family history of breast cancer 07/08/2020  . Family history of ovarian cancer 07/08/2020  . Genital herpes   . History of radiation therapy 12/01/2020-12/29/2020   right breast   Dr JGery Pray  Past Surgical History:  Procedure Laterality Date  . BREAST LUMPECTOMY WITH RADIOACTIVE SEED AND SENTINEL LYMPH NODE BIOPSY Right 07/28/2020  Procedure: RIGHT BREAST LUMPECTOMY X 3  WITH RADIOACTIVE SEED AND SENTINEL LYMPH NODE BIOPSY;  Surgeon: Jovita Kussmaul, MD;  Location: [facility];  Service: General;   Laterality: Right;  PEC BLOCK  . FOOT SURGERY Right   . MOUTH SURGERY Right      ALLERGIES:  Allergies  Allergen Reactions  . Keflex [Cephalexin] Itching  . Shellfish Allergy     Throat and ear itching      CURRENT MEDICATIONS:  Outpatient Encounter Medications as of 03/30/2021  Medication Sig  . ibuprofen (ADVIL) 200 MG tablet Take 200-400 mg by mouth every 6 (six) hours as needed for headache or moderate pain.   . tamoxifen (NOLVADEX) 20 MG tablet Take 20 mg by mouth daily.  . Tetrahydrozoline HCl (VISINE OP) Place 1 drop into both eyes daily as needed (redness).    No facility-administered encounter medications on file as of 03/30/2021.     ONCOLOGIC FAMILY HISTORY:  Family History  Problem Relation Age of Onset  . CVA Other   . Ovarian cancer Sister 94  . Breast cancer Other        maternal grandmother's sister, dx 87s, d. 51s     GENETIC COUNSELING/TESTING: See above  SOCIAL HISTORY:  Social History   Socioeconomic History  . Marital status: Married    Spouse name: Not on file  . Number of children: Not on file  . Years of education: Not on file  . Highest education level: Not on file  Occupational History  . Not on file  Tobacco Use  . Smoking status: Never Smoker  . Smokeless tobacco: Never Used  Vaping Use  . Vaping Use: Never used  Substance and Sexual Activity  . Alcohol use: Yes    Comment: social  . Drug use: No  . Sexual activity: Yes  Other Topics Concern  . Not on file  Social History Narrative  . Not on file   Social Determinants of Health   Financial Resource Strain: Low Risk   . Difficulty of Paying Living Expenses: Not hard at all  Food Insecurity: No Food Insecurity  . Worried About Charity fundraiser in the Last Year: Never true  . Ran Out of Food in the Last Year: Never true  Transportation Needs: No Transportation Needs  . Lack of Transportation (Medical): No  . Lack of Transportation (Non-Medical): No  Physical  Activity: Not on file  Stress: Not on file  Social Connections: Not on file  Intimate Partner Violence: Not on file     OBSERVATIONS/OBJECTIVE:  BP 115/69 (BP Location: Left Arm, Patient Position: Sitting)   Pulse 65   Temp (!) 97.5 F (36.4 C) (Tympanic)   Resp 18   Ht _0  (1.549 m)   Wt 140 lb 14.4 oz (63.9 kg)   SpO2 100%   BMI 26.62 kg/m  GENERAL: Patient is a well appearing female in no acute distress HEENT:  Sclerae anicteric.  Oropharynx clear and moist. No ulcerations or evidence of oropharyngeal candidiasis. Neck is supple.  NODES:  No cervical, supraclavicular, or axillary lymphadenopathy palpated.  BREAST EXAM:  Right breast s/p lumpectomy and radiation no sign of local recurrence left breast benign LUNGS:  Clear to auscultation bilaterally.  No wheezes or rhonchi. HEART:  Regular rate and rhythm. No murmur appreciated. ABDOMEN:  Soft, nontender.  Positive, normoactive bowel sounds. No organomegaly palpated. MSK:  No focal spinal tenderness to palpation. Full range of motion bilaterally in the upper extremities. EXTREMITIES:  No peripheral edema.   SKIN:  Clear with no obvious rashes or skin changes. No nail dyscrasia. NEURO:  Nonfocal. Well oriented.  Appropriate affect.    LABORATORY DATA:  None for this visit.  DIAGNOSTIC IMAGING:  None for this visit.      ASSESSMENT AND PLAN:  [name] [name] is a pleasant 50 y.o. female with Stage IA right breast invasive ductal carcinoma, ER+/PR+/HER2-, diagnosed in 06/2020, treated with lumpectomy, adjuvant radiation therapy, and anti-estrogen therapy with Tamoxifen beginning in 12/2020.  She presents to the Survivorship Clinic for our initial meeting and routine follow-up post-completion of treatment for breast cancer.    1. Stage IA right breast cancer:  [name] [name] is continuing to recover from definitive treatment for breast cancer. She will follow-up with her medical oncologist, Dr. Jana Hakim in 05/2021 with history and  physical exam per surveillance protocol.  She will continue her anti-estrogen therapy with Tamoxifen. Thus far, she is tolerating the Tamoxifen well, with minimal side effects. Her mammogram is due 03/2021; orders placed today.  She was also interested in a genetics referral which I placed as well.   Today, a comprehensive survivorship care plan and treatment summary was reviewed with the patient today detailing her breast cancer diagnosis, treatment course, potential late/long-term effects of treatment, appropriate follow-up care with recommendations for the future, and patient education resources.  A copy of this summary, along with a letter will be sent to the patient's primary care provider via mail/fax/In Basket message after today's visit.    2. Urinary Frequency:  Urinalysis will be collected  3. Chemotherapy induced peripheral neuropathy: This is not bothersome, I reviewed with her that it may improve, however it may be persisten  4. Bone health:  She is scheduled for bone density evaluation 03/2021.  She was given education on specific activities to promote bone health.  5. Cancer screening:  Due to [name] [name]'s history and her age, she should receive screening for skin cancers, colon cancer, and gynecologic cancers.  The information and recommendations are listed on the patient's comprehensive care plan/treatment summary and were reviewed in detail with the patient.    6. Health maintenance and wellness promotion: [name] [name] was encouraged to consume 5-7 servings of fruits and vegetables per day. We reviewed the "Nutrition Rainbow" handout, as well as the handout "Take Control of Your Health and Reduce Your Cancer Risk" from the [place].  She was also encouraged to engage in moderate to vigorous exercise for 30 minutes per day most days of the week. We discussed the [employer] fitness program, which is designed for cancer survivors to help them become more physically fit  after cancer treatments.  She was instructed to limit her alcohol consumption and continue to abstain from tobacco use.     7. Support services/counseling: It is not uncommon for this period of the patient's cancer care trajectory to be one of many emotions and stressors.  We discussed how this can be increasingly difficult during the times of quarantine and social distancing due to the COVID-19 pandemic.   She was given information regarding our available services and encouraged to contact me with any questions or for help enrolling in any of our support group/programs.    Follow up instructions:    -Return to cancer center in 05/2021 for f/u with Dr. Jana Hakim  -Mammogram due in 03/2021 -Follow up with Dr. Marlou Starks 11/2021 -She is welcome to return back to the Survivorship Clinic at any time; no additional  follow-up needed at this time.  -Consider referral back to survivorship as a long-term survivor for continued surveillance  The patient was provided an opportunity to ask questions and all were answered. The patient agreed with the plan and demonstrated an understanding of the instructions.   Total encounter time: 57 minutes* including review of SCP, face to face visit time, order entry, collaboration with medical oncology and surgery, and documentation  Wilber Bihari, NP 03/30/21 9:39 AM Medical Oncology and Hematology [facility] [place], [place] [address] Tel. [phone]    Fax. [phone]  *Total Encounter Time as defined by the Centers for Medicare and Medicaid Services includes, in addition to the face-to-face time of a patient visit (documented in the note above) non-face-to-face time: obtaining and reviewing outside history, ordering and reviewing medications, tests or procedures, care coordination (communications with other health care professionals or caregivers) and documentation in the medical record.

## 2021-03-31 ENCOUNTER — Telehealth: Payer: Self-pay | Admitting: Adult Health

## 2021-03-31 NOTE — Telephone Encounter (Signed)
Called pt to sch appt per 5/12 sch msg. Pt said she needed to check her schedule and would call back to sch this appt.

## 2021-04-18 ENCOUNTER — Ambulatory Visit
Admission: RE | Admit: 2021-04-18 | Discharge: 2021-04-18 | Disposition: A | Discharge status: Home / Self Care | Payer: 59 | Source: Ambulatory Visit | Attending: Oncology | Admitting: Oncology

## 2021-04-18 ENCOUNTER — Other Ambulatory Visit: Payer: Self-pay

## 2021-04-18 ENCOUNTER — Ambulatory Visit: Payer: 59

## 2021-04-18 DIAGNOSIS — Z170 Estrogen receptor positive status [ER+]: Secondary | ICD-10-CM

## 2021-04-18 DIAGNOSIS — C50811 Malignant neoplasm of overlapping sites of right female breast: Secondary | ICD-10-CM

## 2021-05-16 ENCOUNTER — Telehealth: Payer: Self-pay | Admitting: *Deleted

## 2021-05-16 NOTE — Telephone Encounter (Signed)
RETURNED PATIENT'S PHONE CALL, RESCHEDULED FU APPT. TO 06-12-21 @ 3 PM, PER PATIENT REQUEST, PATIENT VERIFIED UNDERSTANDING THIS

## 2021-05-16 NOTE — Telephone Encounter (Signed)
This RN returned VM left by the patient inquiring about bone density performed late May 2022.  Call returned and obtained pt's identified VM- requested a return call to discuss result showing mild bone loss - or can discuss at MD visit on 06/12/2021.  This RN's name given for return call contact.

## 2021-05-18 ENCOUNTER — Ambulatory Visit
Admission: RE | Admit: 2021-05-18 | Discharge: 2021-05-18 | Disposition: A | Discharge status: Home / Self Care | Payer: 59 | Source: Ambulatory Visit | Attending: Radiation Oncology | Admitting: Radiation Oncology

## 2021-06-05 ENCOUNTER — Ambulatory Visit: Payer: 59 | Admitting: Oncology

## 2021-06-05 ENCOUNTER — Other Ambulatory Visit: Payer: 59

## 2021-06-07 ENCOUNTER — Encounter: Payer: Self-pay | Admitting: Radiology

## 2021-06-11 NOTE — Progress Notes (Addendum)
[facility]  [phone] [phone]     ID: [name] [name] DOB: 03-02-1971  MR#: [id number]  DHR#:[id number]  Patient Care Team: Aretta Nip, MD as PCP - General (Family Medicine) Jovita Kussmaul, MD as Consulting Physician (General Surgery) Alleya Demeter, Virgie Dad, MD as Consulting Physician (Oncology) Gery Pray, MD as Consulting Physician (Radiation Oncology) Janyth Pupa, DO as Consulting Physician (Obstetrics and Gynecology) Claris Pong, MD as Referring Physician (Hematology and Oncology) Chauncey Cruel, MD OTHER MD:  CHIEF COMPLAINT: Estrogen receptor positive breast cancer  CURRENT TREATMENT: tamoxifen   INTERVAL HISTORY: [name] [name] returns today for follow-up of her estrogen positive breast cancer accompanied by her husband [name] [name].    She was started on tamoxifen at her last visit on 01/10/2021.  Since her last visit, she underwent bilateral diagnostic mammography with tomography at [facility] on 04/18/2021 showing: breast density category C; no evidence of malignancy in either breast.  She also underwent bone density screening the same day showing a T-score of -1.5, which is considered osteopenic.   REVIEW OF SYSTEMS: [name] [name] is tolerating tamoxifen moderately well.  She is having hot flashes.  They do not wake her up but they do cause her quite a bit of "heat" during the day and they have settled on a temperature of 71-72 degrees for the home thermostat.  She has not had a period since her chemo.  She has seen physical therapy and there is no issue regarding lymphedema.  She has a good range of motion of right upper extremity.  A detailed review of systems today was otherwise stable.   COVID 19 VACCINATION STATUS: s/p Pfizer x 2, last dose July   HISTORY OF CURRENT ILLNESS: From the original intake note:  [name] [name] herself noted a change in her right breast when exercising.  She thinks she first  noticed this sometime in April 2021.  It was like an indentation when she AB duct at the right upper extremity.  Screening mammogram from 04/15/2020 was negative, with breast density category C.  However the problem became more of a palpable nontender lump and after evaluation by her primary care physician she underwent right breast ultrasonography at [facility] on 06/23/2020 showing: 1.6 cm mass in right breast at 8 o'clock; 1.2 cm mass in right breast at 9 o'clock; the masses are 1.5 cm apart; single indeterminate mildly abnormal node in right axilla.  Accordingly on 06/27/2020 she proceeded to biopsy of the right breast areas in question. The pathology from this procedure ([id number]) showed: invasive and in situ mammary carcinoma, e-cadherin positive, grade 1-2. Both masses showed similar morphology. Prognostic indicators significant for: estrogen receptor, 100% positive with strong staining intensity and progesterone receptor, 90% positive with moderate-weak staining intensity. Proliferation marker Ki67 at 2%. HER2 equivocal by immunohistochemistry (2+), but negative by fluorescent in situ hybridization with a signals ratio 1.47 and number per cell 2.5.  The patient's subsequent history is as detailed below.    PAST MEDICAL HISTORY: Past Medical History:  Diagnosis Date   Asthma    Cancer [facility])    Family history of breast cancer 07/08/2020   Family history of ovarian cancer 07/08/2020   Genital herpes    History of radiation therapy 11/30/2020-12/29/2020   right breast   Dr Gery Pray    PAST SURGICAL HISTORY: Past Surgical History:  Procedure Laterality Date   BREAST LUMPECTOMY WITH RADIOACTIVE SEED AND SENTINEL LYMPH NODE BIOPSY Right 07/28/2020   Procedure: RIGHT BREAST LUMPECTOMY  X 3  WITH RADIOACTIVE SEED AND SENTINEL LYMPH NODE BIOPSY;  Surgeon: Jovita Kussmaul, MD;  Location: [facility];  Service: General;  Laterality: Right;  PEC BLOCK   FOOT SURGERY Right    MOUTH SURGERY Right     FAMILY  HISTORY: Family History  Problem Relation Age of Onset   CVA Other    Ovarian cancer Sister 31   Breast cancer Other        maternal grandmother's sister, dx 81s, d. 69s   Her father is 61, and her mother 49 as of 06/2020. [name] [name] has 3 brothers and 4 sisters. She reports ovarian cancer in her sister at age 57 (sister is doing well now) and breast cancer in a maternal great-aunt (post-menopausal).   GYNECOLOGIC HISTORY:  Last menstrual period August 2021  Menarche: 50 years old Age at first live birth: 50 years old [place] P 1 LMP periods are irregular Contraceptive: Patient's husband is status post vasectomy HRT n/a  Hysterectomy? no BSO? no   SOCIAL HISTORY: (updated 06/2020)  [name] [name] work as a Dentist. Husband [name] [name] "[name] [name]" is a Airline pilot. She lives at home with [name] [name]. Daughter [name] [name], age 65, is a Education administrator here in [place]. [name] [name] attends [employer].    ADVANCED DIRECTIVES: In the absence of any documentation to the contrary, the patient's spouse is their HCPOA.    HEALTH MAINTENANCE: Social History   Tobacco Use   Smoking status: Never   Smokeless tobacco: Never  Vaping Use   Vaping Use: Never used  Substance Use Topics   Alcohol use: Yes    Comment: social   Drug use: No     Colonoscopy: n/a (age)  PAP: 01/2020  Bone density: n/a (age)   Allergies  Allergen Reactions   Keflex [Cephalexin] Itching   Shellfish Allergy     Throat and ear itching     Current Outpatient Medications  Medication Sig Dispense Refill   cholecalciferol (VITAMIN D3) 25 MCG (1000 UNIT) tablet Take 1 tablet (1,000 Units total) by mouth daily.     venlafaxine XR (EFFEXOR-XR) 37.5 MG 24 hr capsule Take 1 capsule (37.5 mg total) by mouth daily with breakfast. 90 capsule 4   ibuprofen (ADVIL) 200 MG tablet Take 200-400 mg by mouth every 6 (six) hours as needed for headache or moderate pain.      tamoxifen (NOLVADEX) 20 MG tablet Take 20 mg by mouth  daily.     Tetrahydrozoline HCl (VISINE OP) Place 1 drop into both eyes daily as needed (redness).      No current facility-administered medications for this visit.    OBJECTIVE: African-American woman who appears younger than stated age  73:   06/12/21 1005  BP: 114/70  Pulse: 65  Resp: 18  Temp: 97.7 F (36.5 C)  SpO2: 100%     Body mass index is 27.04 kg/m.   Wt Readings from Last 3 Encounters:  06/12/21 143 lb 1.6 oz (64.9 kg)  06/12/21 141 lb 6.4 oz (64.1 kg)  03/30/21 140 lb 14.4 oz (63.9 kg)     ECOG FS:1 - Symptomatic but completely ambulatory  Sclerae unicteric, EOMs intact Wearing a mask No cervical or supraclavicular adenopathy Lungs no rales or rhonchi Heart regular rate and rhythm Abd soft, nontender, positive bowel sounds MSK no focal spinal tenderness, no upper extremity lymphedema Neuro: nonfocal, well oriented, appropriate affect Breasts: The right breast is status postlumpectomy and radiation.  There is minimal hyperpigmentation.  There is some  coarsening of the skin as noted and there is some induration associated with the scar also as expected.  There is no evidence of local recurrence.  The left breast and both axilla are benign   LAB RESULTS:  CMP     Component Value Date/Time   NA 143 06/12/2021 0957   K 4.1 06/12/2021 0957   CL 108 06/12/2021 0957   CO2 28 06/12/2021 0957   GLUCOSE 116 (H) 06/12/2021 0957   BUN 13 06/12/2021 0957   CREATININE 0.92 06/12/2021 0957   CREATININE 0.91 08/03/2020 1536   CALCIUM 9.2 06/12/2021 0957   PROT 6.6 06/12/2021 0957   ALBUMIN 3.6 06/12/2021 0957   AST 21 06/12/2021 0957   AST 47 (H) 08/03/2020 1536   ALT 15 06/12/2021 0957   ALT 58 (H) 08/03/2020 1536   ALKPHOS 85 06/12/2021 0957   BILITOT 0.6 06/12/2021 0957   BILITOT 0.4 08/03/2020 1536   GFRNONAA >60 06/12/2021 0957   GFRNONAA >60 08/03/2020 1536   GFRAA >60 08/03/2020 1536    No results found for: TOTALPROTELP, ALBUMINELP, A1GS, A2GS,  BETS, BETA2SER, GAMS, MSPIKE, SPEI  Lab Results  Component Value Date   WBC 3.9 (L) 06/12/2021   NEUTROABS 1.7 06/12/2021   HGB 12.5 06/12/2021   HCT 36.2 06/12/2021   MCV 89.8 06/12/2021   PLT 262 06/12/2021    No results found for: LABCA2  No components found for: DXAJOI786  No results for input(s): INR in the last 168 hours.  No results found for: LABCA2  No results found for: VEH209  No results found for: OBS962  No results found for: EZM629  No results found for: CA2729  No components found for: HGQUANT  No results found for: CEA1 / No results found for: CEA1   No results found for: AFPTUMOR  No results found for: CHROMOGRNA  No results found for: KPAFRELGTCHN, LAMBDASER, KAPLAMBRATIO (kappa/lambda light chains)  No results found for: HGBA, HGBA2QUANT, HGBFQUANT, HGBSQUAN (Hemoglobinopathy evaluation)   No results found for: LDH  No results found for: IRON, TIBC, IRONPCTSAT (Iron and TIBC)  No results found for: FERRITIN  Urinalysis    Component Value Date/Time   COLORURINE YELLOW 03/30/2021 0941   APPEARANCEUR CLEAR 03/30/2021 0941   LABSPEC 1.018 03/30/2021 0941   PHURINE 5.0 03/30/2021 [place] 03/30/2021 0941   HGBUR NEGATIVE 03/30/2021 [place] 03/30/2021 0941   KETONESUR NEGATIVE 03/30/2021 0941   PROTEINUR NEGATIVE 03/30/2021 0941   NITRITE NEGATIVE 03/30/2021 0941   LEUKOCYTESUR NEGATIVE 03/30/2021 0941    STUDIES: No results found.    ELIGIBLE FOR AVAILABLE RESEARCH PROTOCOL: AET  ASSESSMENT: 50 y.o. [name] [name] woman status post right breast biopsy x2 on 06/27/2020 for an mT1c N0, stage IA invasive ductal carcinoma, grade 1 or 2, estrogen and progesterone receptor positive, HER-2 not amplified, with an MIB-1 of 2%.  (a) an equivocal right axillary lymph node was biopsied 06/27/2020 and was benign  (b) chest CT scan 07/08/2020 showed no evidence of metastatic disease  (1) genetics testing  recommended on 07/07/2020 but declined by patient  (2)  S/p right lumpectomy 07/28/2020 for an  mpT1c pN0, stage IA invasive ductal carcinoma, grade 2, with negative margins   (a) a total of 4 right axillary lymph nodes removed, all clear  (3) Oncotype score of 28 predicts a risk of this breast cancer recurring outside the breast within the next 9 years of 17% if the patient's only systemic therapy is antiestrogens  for 5 years.  It also predicts a significant benefit from chemotherapy.  (4) adjuvant chemotherapy consisting of docetaxel and cyclophosphamide given every 21 days x 4 started 09/02/2020, completed 11/04/2020 [facility])  (5) adjuvant radiation completed 12/29/2020  (6) tamoxifen started 01/10/2021  (a) consider goserelin/anastrozole if menstruation resumes post chemo   PLAN: [name] [name] will soon be a year out from definitive surgery for her breast cancer with no evidence of disease recurrence.  This is favorable.  She is tolerating tamoxifen moderately well.  I think she would benefit from venlafaxine and I have written her for the very lowest dose.  I have asked her to call us in 2 or 3 weeks and let us know how that is working for her.  She does have some nocturia.  I do not think this is going to be related to anything we have done.  She thinks perhaps the fibroid is pressing on her bladder and making her bladder feel like she needs to go because when she gets up to urinate she actually does very little.  She will be discussing this with her gynecologist.  We went over the results of her bone density which showed mild osteopenia.  She has recently been started on vitamin D.  She would prefer not to take calcium because it constipates her.  We discussed the benefits of weightbearing exercise  She will see me again in November.  If she is doing well at that point then she will see Dr. Marlou Starks in May and we can continue to tag team her every 6 months until she completes her 5 years of  follow-up  Total encounter time 35 minutes.[name] [name] C. Rajanae Mantia, MD 06/12/21 10:42 AM Medical Oncology and Hematology [facility] [place], [place] [address] Tel. [phone]    Fax. [phone]   I, Wilburn Mylar, am acting as scribe for Dr. Virgie Dad. Bindi Klomp.  I, Lurline Del MD, have reviewed the above documentation for accuracy and completeness, and I agree with the above.   *Total Encounter Time as defined by the Centers for Medicare and Medicaid Services includes, in addition to the face-to-face time of a patient visit (documented in the note above) non-face-to-face time: obtaining and reviewing outside history, ordering and reviewing medications, tests or procedures, care coordination (communications with other health care professionals or caregivers) and documentation in the medical record.

## 2021-06-11 NOTE — Progress Notes (Signed)
Radiation Oncology         [phone] ________________________________  Name: [name] [name] MRN: [id number]  Date: 06/12/2021  DOB: 06-Jul-1971  Follow-Up Visit Note  CC: [name], [name], MD  Jovita Kussmaul, MD    ICD-10-CM   1. Malignant neoplasm of overlapping sites of right breast in female, estrogen receptor positive ([place])  C50.811    Z17.0       Diagnosis:   Stage IA (mpT1c, pN0) Right Breast LOQ, Invasive Ductal Carcinoma with DCIS, ER+ / PR+ / Her2-, Grade 2  Interval Since Last Radiation: 5 months and 15 days  Intent: Curative   Radiation Treatment Dates: 11/30/2020 through 12/29/2020   Site: Right breast Technique: 3D Total Dose (Gy): 40.05/40.05 Dose per Fx (Gy): 2.67 Completed Fx: 15/15 Beam Energies: 6X   Site: Right breast boost Technique: 3D Total Dose (Gy): 10/10 Dose per Fx (Gy): 2 Completed Fx: 5/5 Beam Energies: 6X, 10X   Narrative:  The patient returns today for routine 3 month follow-up (visit rescheduled from 05/18/21). Since she was last seen, she met with Dr. Jana Hakim on 02/14/21 in which it was noted that she is continuing to work out and is determined to get her strength back. The patient mentioned the she is particularly focused on regaining strength to her upper body; she stated during the visit that her upper body looked far frailer than she remembered. Overall, Dr. Jana Hakim noted during this visit that he did not find any evidence or symptoms concerning for disease recurrence.   The patient received a bilateral mammogram on 04/18/21 which revealed no evidence of malignancy involving either breast.   Of note: Dr. Jana Hakim referred the patient for bone density x-ray on 04/18/21. Results from the x-ray revealed the patient's probability of a major osteoporotic fracture within the next 10 years to be 2.1%.  She has completed physical therapy.  She denies any chronic pain in the right breast.  She does self massage and more regular basis.   She denies any nipple discharge or bleeding.  She has noticed problems with hot flashes and night sweats.  She was seen by Dr. Jana Hakim earlier today and was given Effexor for this issue.  She has been on tamoxifen.  She continues to exercise regularly.  She denies any range of movement issues in her right arm or shoulder  Allergies:  is allergic to keflex [cephalexin] and shellfish allergy.  Meds: Current Outpatient Medications  Medication Sig Dispense Refill   cholecalciferol (VITAMIN D3) 25 MCG (1000 UNIT) tablet Take 1 tablet (1,000 Units total) by mouth daily.     ibuprofen (ADVIL) 200 MG tablet Take 200-400 mg by mouth every 6 (six) hours as needed for headache or moderate pain.      tamoxifen (NOLVADEX) 20 MG tablet Take 20 mg by mouth daily.     Tetrahydrozoline HCl (VISINE OP) Place 1 drop into both eyes daily as needed (redness).      venlafaxine XR (EFFEXOR-XR) 37.5 MG 24 hr capsule Take 1 capsule (37.5 mg total) by mouth daily with breakfast. (Patient not taking: Reported on 06/12/2021) 90 capsule 4   No current facility-administered medications for this encounter.    Physical Findings: The patient is in no acute distress. Patient is alert and oriented.  height is 5' 4" (1.626 m) and weight is 144 lb 6.4 oz (65.5 kg). Her temperature is 97.6 F (36.4 C). Her blood pressure is 116/84 and her pulse is 68. Her respiration is 18 and oxygen  saturation is 100%. .  No significant changes. Lungs are clear to auscultation bilaterally. Heart has regular rate and rhythm. No palpable cervical, supraclavicular, or axillary adenopathy. Abdomen soft, non-tender, normal bowel sounds. Left breast: no palpable mass, nipple discharge or bleeding.  The right breast area shows some mild hyperpigmentation changes.  Some mild edema noted in the breast area.  No dominant mass appreciated in the breast nipple discharge or bleeding.  Lumpectomy scar in the lateral aspect of the breast well-healed without any  suspicious induration.   Lab Findings: Lab Results  Component Value Date   WBC 3.9 (L) 06/12/2021   HGB 12.5 06/12/2021   HCT 36.2 06/12/2021   MCV 89.8 06/12/2021   PLT 262 06/12/2021    Radiographic Findings: No results found.  Impression:  Stage IA (mpT1c, pN0) Right Breast LOQ, Invasive Ductal Carcinoma with DCIS, ER+ / PR+ / Her2-, Grade 2  The patient is recovering from the effects of radiation.  No evidence of recurrence on clinical exam today.    Plan: As needed follow-up in radiation oncology as she continues to see medical oncology on a regular basis with her tamoxifen.  She also continues to follow-up with Dr. Marlou Starks and is scheduled to see him tomorrow.    ____________________________________  Blair Promise, PhD, MD   This document serves as a record of services personally performed by Gery Pray, MD. It was created on his behalf by Roney Mans, a trained medical scribe. The creation of this record is based on the scribe's personal observations and the provider's statements to them. This document has been checked and approved by the attending provider.

## 2021-06-12 ENCOUNTER — Other Ambulatory Visit: Payer: Self-pay

## 2021-06-12 ENCOUNTER — Ambulatory Visit: Payer: 59 | Attending: Radiation Oncology

## 2021-06-12 ENCOUNTER — Ambulatory Visit
Admission: RE | Admit: 2021-06-12 | Discharge: 2021-06-12 | Disposition: A | Discharge status: Home / Self Care | Payer: 59 | Source: Ambulatory Visit | Attending: Radiation Oncology | Admitting: Radiation Oncology

## 2021-06-12 ENCOUNTER — Inpatient Hospital Stay: Payer: 59 | Attending: Oncology

## 2021-06-12 ENCOUNTER — Inpatient Hospital Stay (HOSPITAL_BASED_OUTPATIENT_CLINIC_OR_DEPARTMENT_OTHER): Payer: 59 | Admitting: Oncology

## 2021-06-12 ENCOUNTER — Encounter: Payer: Self-pay | Admitting: Radiation Oncology

## 2021-06-12 ENCOUNTER — Encounter: Payer: Self-pay | Admitting: Gastroenterology

## 2021-06-12 VITALS — BP 116/84 | HR 68 | Temp 97.6°F | Resp 18 | Ht 64.0 in | Wt 144.4 lb

## 2021-06-12 VITALS — BP 114/70 | HR 65 | Temp 97.7°F | Resp 18 | Ht 61.0 in | Wt 143.1 lb

## 2021-06-12 VITALS — Wt 141.4 lb

## 2021-06-12 DIAGNOSIS — Z79810 Long term (current) use of selective estrogen receptor modulators (SERMs): Secondary | ICD-10-CM | POA: Insufficient documentation

## 2021-06-12 DIAGNOSIS — Z483 Aftercare following surgery for neoplasm: Secondary | ICD-10-CM | POA: Insufficient documentation

## 2021-06-12 DIAGNOSIS — R61 Generalized hyperhidrosis: Secondary | ICD-10-CM | POA: Diagnosis not present

## 2021-06-12 DIAGNOSIS — C50811 Malignant neoplasm of overlapping sites of right female breast: Secondary | ICD-10-CM | POA: Insufficient documentation

## 2021-06-12 DIAGNOSIS — R351 Nocturia: Secondary | ICD-10-CM | POA: Insufficient documentation

## 2021-06-12 DIAGNOSIS — M8580 Other specified disorders of bone density and structure, unspecified site: Secondary | ICD-10-CM | POA: Diagnosis not present

## 2021-06-12 DIAGNOSIS — R232 Flushing: Secondary | ICD-10-CM | POA: Diagnosis not present

## 2021-06-12 DIAGNOSIS — Z923 Personal history of irradiation: Secondary | ICD-10-CM | POA: Insufficient documentation

## 2021-06-12 DIAGNOSIS — Z79899 Other long term (current) drug therapy: Secondary | ICD-10-CM | POA: Insufficient documentation

## 2021-06-12 DIAGNOSIS — Z170 Estrogen receptor positive status [ER+]: Secondary | ICD-10-CM | POA: Insufficient documentation

## 2021-06-12 LAB — COMPREHENSIVE METABOLIC PANEL
ALT: 15 U/L (ref 0–44)
AST: 21 U/L (ref 15–41)
Albumin: 3.6 g/dL (ref 3.5–5.0)
Alkaline Phosphatase: 85 U/L (ref 38–126)
Anion gap: 7 (ref 5–15)
BUN: 13 mg/dL (ref 6–20)
CO2: 28 mmol/L (ref 22–32)
Calcium: 9.2 mg/dL (ref 8.9–10.3)
Chloride: 108 mmol/L (ref 98–111)
Creatinine, Ser: 0.92 mg/dL (ref 0.44–1.00)
GFR, Estimated: >60 mL/min (ref >60)
Glucose, Bld: 116 mg/dL — ABNORMAL HIGH (ref 70–99)
Potassium: 4.1 mmol/L (ref 3.5–5.1)
Sodium: 143 mmol/L (ref 135–145)
Total Bilirubin: 0.6 mg/dL (ref 0.3–1.2)
Total Protein: 6.6 g/dL (ref 6.5–8.1)

## 2021-06-12 LAB — CBC WITH DIFFERENTIAL/PLATELET
Abs Immature Granulocytes: 0.01 10*3/uL (ref 0.00–0.07)
Basophils Absolute: 0 10*3/uL (ref 0.0–0.1)
Basophils Relative: 1 %
Eosinophils Absolute: 0 10*3/uL (ref 0.0–0.5)
Eosinophils Relative: 1 %
HCT: 36.2 % (ref 36.0–46.0)
Hemoglobin: 12.5 g/dL (ref 12.0–15.0)
Immature Granulocytes: 0 %
Lymphocytes Relative: 43 %
Lymphs Abs: 1.7 10*3/uL (ref 0.7–4.0)
MCH: 31 pg (ref 26.0–34.0)
MCHC: 34.5 g/dL (ref 30.0–36.0)
MCV: 89.8 fL (ref 80.0–100.0)
Monocytes Absolute: 0.4 10*3/uL (ref 0.1–1.0)
Monocytes Relative: 10 %
Neutro Abs: 1.7 10*3/uL (ref 1.7–7.7)
Neutrophils Relative %: 45 %
Platelets: 262 10*3/uL (ref 150–400)
RBC: 4.03 MIL/uL (ref 3.87–5.11)
RDW: 11.3 % — ABNORMAL LOW (ref 11.5–15.5)
WBC: 3.9 10*3/uL — ABNORMAL LOW (ref 4.0–10.5)
nRBC: 0 % (ref 0.0–0.2)

## 2021-06-12 MED ORDER — VENLAFAXINE HCL ER 37.5 MG PO CP24: 37.5000 mg | ORAL_CAPSULE | Freq: Every day | ORAL | 4 refills | Status: DC

## 2021-06-12 NOTE — Therapy (Signed)
[place], [place], [address] Phone: [phone]   Fax:  [phone]  Physical Therapy Treatment  Patient Details  Name: [name] [name] MRN: [id number] Date of Birth: 1970-11-29 Referring Provider (PT): Dr. Autumn Messing   Encounter Date: 06/12/2021   PT End of Session - 06/12/21 1749     Visit Number 12   # unchanged due to screen only   PT Start Time 0813    PT Stop Time 0822    PT Time Calculation (min) 9 min    Activity Tolerance Patient tolerated treatment well    Behavior During Therapy [facility] for tasks assessed/performed             Past Medical History:  Diagnosis Date   Asthma    Cancer ([place])    Family history of breast cancer 07/08/2020   Family history of ovarian cancer 07/08/2020   Genital herpes    History of radiation therapy 11/30/2020-12/29/2020   right breast   Dr Gery Pray    Past Surgical History:  Procedure Laterality Date   BREAST LUMPECTOMY WITH RADIOACTIVE SEED AND SENTINEL LYMPH NODE BIOPSY Right 07/28/2020   Procedure: RIGHT BREAST LUMPECTOMY X 3  WITH RADIOACTIVE SEED AND SENTINEL LYMPH NODE BIOPSY;  Surgeon: Jovita Kussmaul, MD;  Location: [facility];  Service: General;  Laterality: Right;  PEC BLOCK   FOOT SURGERY Right    MOUTH SURGERY Right     Vitals:   06/12/21 0817  Weight: 141 lb 6.4 oz (64.1 kg)     Subjective Assessment - 06/12/21 0818     Subjective Pt returns for her 3 month L-Dex screen.    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 It is ER/PR positive and HER2 negative with a Ki67 of 2%. Patient reports she underwent a right lumpectomy and sentinel node biopsy (4 negative nodes) on 07/28/2020. She developed a seroma in her right axilla that has been drained and now has cording in her right axilla. She is undergoing chemotherapy and completed radiation on 01/05/2021                     L-DEX FLOWSHEETS - 06/12/21 0800       L-DEX LYMPHEDEMA SCREENING   Measurement Type Unilateral    L-DEX MEASUREMENT EXTREMITY Upper Extremity    POSITION  Standing    DOMINANT SIDE Right    At Risk Side Right    BASELINE SCORE (UNILATERAL) -0.2    L-DEX SCORE (UNILATERAL) 1.3    VALUE CHANGE (UNILAT) 1.5                                    PT Long Term Goals - 01/10/21 1331       PT LONG TERM GOAL #1   Title Pt will have 150 degrees of right shoulder abduction with no pulling from axillary cording.    Status Achieved      PT LONG TERM GOAL #2   Title Pt will know how to manage the fullness and pain from cording in her right axilla with MLD, compression and exercise    Status Achieved      PT LONG TERM GOAL #3   Title Pt will have return of L-Dex score to baseling warranting no further treatment for lymphedema    Status Achieved  Plan - 06/12/21 0823     Clinical Impression Statement Pt returns for her 3 month L-Dex screen. Her change from baseline of 1.5 is WNLs so no further treatment is required at this time except to cont every 3 month L-Dex screens which pt is agreeable to. Answered her questions about best times to wear her compression sleeve to prevent subclinical lymphedema from occuring again as it did a few months ago.    PT Next Visit Plan Cont every 3 month L-Dex screens for up to 2 years from her SLNB.    Consulted and Agree with Plan of Care Patient             Patient will benefit from skilled therapeutic intervention in order to improve the following deficits and impairments:     Visit Diagnosis: Aftercare following surgery for neoplasm     Problem List Patient Active Problem List   Diagnosis Date Noted   Family history of ovarian cancer 07/08/2020   Family history of breast cancer 07/08/2020   Malignant neoplasm of overlapping sites of right breast in female, estrogen  receptor positive ([place]) 07/01/2020   Murmur 03/09/2014   Bradycardia 03/09/2014   Genital herpes     [name], PTA 06/12/2021, 8:24 AM  [place], [place], [address] Phone: [phone]   Fax:  [phone]  Name: [name] [name] MRN: [id number] Date of Birth: 1970/12/04

## 2021-06-12 NOTE — Progress Notes (Signed)
[name] [name] is here today for follow up post radiation to the breast.   Breast Side:right   They completed their radiation on: 12/29/2020   Does the patient complain of any of the following: Post radiation skin issues: denies Breast Tenderness: tenderness noted after working out Breast Swelling: mild swelling after working out [employer]: denies Range of Motion limitations: full range of motion Fatigue post radiation: denies Appetite good/fair/poor: fair  Additional comments if applicable: tingling and numbness noted to bilateral hands upon waking in the morning, reports frequency  Vitals:   06/12/21 1452  BP: 116/84  Pulse: 68  Resp: 18  Temp: 97.6 F (36.4 C)  SpO2: 100%  Weight: 144 lb 6.4 oz (65.5 kg)  Height: '5\' 4"'$  (1.626 m)

## 2021-06-13 ENCOUNTER — Ambulatory Visit: Payer: 59 | Admitting: Oncology

## 2021-06-13 ENCOUNTER — Telehealth: Payer: Self-pay | Admitting: Oncology

## 2021-06-13 ENCOUNTER — Telehealth: Payer: Self-pay | Admitting: *Deleted

## 2021-06-13 ENCOUNTER — Other Ambulatory Visit: Payer: 59

## 2021-06-13 MED ORDER — GABAPENTIN 300 MG PO CAPS: 300.0000 mg | ORAL_CAPSULE | Freq: Every evening | ORAL | 3 refills | Status: DC | PRN

## 2021-06-13 NOTE — Telephone Encounter (Signed)
This RN spoke with patient per her call stating she is unsure why Dr Jana Hakim ordered venlafaxine.  She is having hot flashes but they seem to be precipitated during the night when she wakes due to needing to urinate.  This RN discussed the use of venlafaxine for hot flashes with good outcome and why this office prescribes.  Per her review of this medication she would prefer to not take it and have it removed from her chart.  Per further discussion regarding hot flashes and possible medications she could take if needed- reviewed gabapentin with her.  She would like for this drug to be sent to her pharmacy.  Prescription sent per above.

## 2021-06-13 NOTE — Telephone Encounter (Signed)
Scheduled appointment per 07/25 los. Left message.

## 2021-07-11 ENCOUNTER — Encounter: Payer: Self-pay | Admitting: Plastic Surgery

## 2021-07-11 ENCOUNTER — Other Ambulatory Visit: Payer: Self-pay

## 2021-07-11 ENCOUNTER — Ambulatory Visit (INDEPENDENT_AMBULATORY_CARE_PROVIDER_SITE_OTHER): Payer: 59 | Admitting: Plastic Surgery

## 2021-07-11 DIAGNOSIS — C50811 Malignant neoplasm of overlapping sites of right female breast: Secondary | ICD-10-CM

## 2021-07-11 DIAGNOSIS — Z170 Estrogen receptor positive status [ER+]: Secondary | ICD-10-CM | POA: Diagnosis not present

## 2021-07-11 NOTE — Progress Notes (Signed)
   Subjective:    Patient ID: [name] [name], female    DOB: 1971-07-23, 50 y.o.   MRN: [id number]  The patient is a 49 year old female here for follow-up on her breast surgery.  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  It was estrogen and progesterone positive and HER2 negative.  She underwent a partial mastectomy with postop radiation.  She is overall doing extremely well.  The skin damage has markedly improved.  She has really good symmetry.  She has no restriction in movement.  She went through physical therapy and felt that that did help.  Her skin is almost back to normal.  Still has the radiation changes but much better than it was at her last visit.  She is very pleased and does not want to have any intervention.     Review of Systems  Constitutional: Negative.   HENT: Negative.    Eyes: Negative.   Respiratory: Negative.    Cardiovascular: Negative.   Gastrointestinal: Negative.   Endocrine: Negative.   Genitourinary: Negative.   Neurological: Negative.   Hematological: Negative.   Psychiatric/Behavioral: Negative.        Objective:   Physical Exam Vitals and nursing note reviewed.  Constitutional:      Appearance: Normal appearance.  HENT:     Head: Normocephalic and atraumatic.  Cardiovascular:     Rate and Rhythm: Normal rate.     Pulses: Normal pulses.  Pulmonary:     Effort: Pulmonary effort is normal.  Abdominal:     General: Abdomen is flat.  Skin:    General: Skin is warm.     Capillary Refill: Capillary refill takes less than 2 seconds.     Coloration: Skin is not jaundiced.     Findings: No bruising.  Neurological:     General: No focal deficit present.     Mental Status: She is alert and oriented to person, place, and time.       Assessment & Plan:     ICD-10-CM   1. Malignant neoplasm of overlapping sites of right breast in female, estrogen  receptor positive ([facility])  C50.811    Z17.0       Follow-up as needed.  We remain available if any changes do occur.  We recommend continuing with regional motion to be sure that the skin does not get scarred down to the ribs.  Pictures were obtained of the patient and placed in the chart with the patient's or guardian's permission.

## 2021-08-15 ENCOUNTER — Encounter: Payer: Self-pay | Admitting: Gastroenterology

## 2021-08-15 ENCOUNTER — Other Ambulatory Visit: Payer: Self-pay

## 2021-08-15 ENCOUNTER — Ambulatory Visit (AMBULATORY_SURGERY_CENTER): Payer: 59 | Admitting: *Deleted

## 2021-08-15 VITALS — Ht 61.0 in | Wt 143.0 lb

## 2021-08-15 DIAGNOSIS — Z1211 Encounter for screening for malignant neoplasm of colon: Secondary | ICD-10-CM

## 2021-08-15 MED ORDER — PEG-KCL-NACL-NASULF-NA ASC-C 100 G PO SOLR: 1.0000 | Freq: Once | ORAL | 0 refills | Status: AC

## 2021-08-15 NOTE — Progress Notes (Signed)
No egg or soy allergy known to patient  No issues known to pt with past sedation with any surgeries or procedures Patient denies ever being told they had issues or difficulty with intubation  No FH of Malignant Hyperthermia Pt is not on diet pills Pt is not on  home 02  Pt is not on blood thinners  Pt denies issues with constipation  No A fib or A flutter  Emmi via My Chart   Pt is fully vaccinated  for Covid   Due to the COVID-19 pandemic we are asking patients to follow certain guidelines.  Pt aware of COVID protocols and [facility] guidelines  Pt verified name, DOB, address and insurance during PV today.  Pt mailed instruction packet of Emmi video, copy of consent form to read and not return, and instructions.  . PV completed over the phone.  Pt encouraged to call with questions or issues.  My Chart instructions to pt as well    Included Golytely instructions for pt in case Movi prep too expensive at her request

## 2021-08-20 ENCOUNTER — Encounter: Payer: Self-pay | Admitting: Oncology

## 2021-08-28 ENCOUNTER — Other Ambulatory Visit: Payer: Self-pay

## 2021-08-28 ENCOUNTER — Encounter: Payer: Self-pay | Admitting: Physical Therapy

## 2021-08-28 ENCOUNTER — Ambulatory Visit (AMBULATORY_SURGERY_CENTER): Payer: 59 | Admitting: Gastroenterology

## 2021-08-28 ENCOUNTER — Encounter: Payer: Self-pay | Admitting: Gastroenterology

## 2021-08-28 ENCOUNTER — Ambulatory Visit: Payer: 59 | Attending: Radiation Oncology

## 2021-08-28 VITALS — BP 118/68 | HR 62 | Temp 98.4°F | Resp 18 | Ht 61.0 in | Wt 138.0 lb

## 2021-08-28 DIAGNOSIS — D122 Benign neoplasm of ascending colon: Secondary | ICD-10-CM

## 2021-08-28 DIAGNOSIS — Z1211 Encounter for screening for malignant neoplasm of colon: Secondary | ICD-10-CM

## 2021-08-28 DIAGNOSIS — Z483 Aftercare following surgery for neoplasm: Secondary | ICD-10-CM | POA: Insufficient documentation

## 2021-08-28 MED ORDER — SODIUM CHLORIDE 0.9 % IV SOLN: 500.0000 mL | Freq: Once | INTRAVENOUS | Status: DC

## 2021-08-28 NOTE — Progress Notes (Signed)
Called to room to assist during endoscopic procedure.  Patient ID and intended procedure confirmed with present staff. Received instructions for my participation in the procedure from the performing physician.  

## 2021-08-28 NOTE — Patient Instructions (Signed)
Please read handouts provided. Continue present medications. Await pathology results.   YOU HAD AN ENDOSCOPIC PROCEDURE TODAY AT THE [facility]:   Refer to the procedure report that was given to you for any specific questions about what was found during the examination.  If the procedure report does not answer your questions, please call your gastroenterologist to clarify.  If you requested that your care partner not be given the details of your procedure findings, then the procedure report has been included in a sealed envelope for you to review at your convenience later.  YOU SHOULD EXPECT: Some feelings of bloating in the abdomen. Passage of more gas than usual.  Walking can help get rid of the air that was put into your GI tract during the procedure and reduce the bloating. If you had a lower endoscopy (such as a colonoscopy or flexible sigmoidoscopy) you may notice spotting of blood in your stool or on the toilet paper. If you underwent a bowel prep for your procedure, you may not have a normal bowel movement for a few days.  Please Note:  You might notice some irritation and congestion in your nose or some drainage.  This is from the oxygen used during your procedure.  There is no need for concern and it should clear up in a day or so.  SYMPTOMS TO REPORT IMMEDIATELY:  Following lower endoscopy (colonoscopy or flexible sigmoidoscopy):  Excessive amounts of blood in the stool  Significant tenderness or worsening of abdominal pains  Swelling of the abdomen that is new, acute  Fever of 100F or higher   For urgent or emergent issues, a gastroenterologist can be reached at any hour by calling [phone]. Do not use MyChart messaging for urgent concerns.    DIET:  We do recommend a small meal at first, but then you may proceed to your regular diet.  Drink plenty of fluids but you should avoid alcoholic beverages for 24 hours.  ACTIVITY:  You should plan to take it easy  for the rest of today and you should NOT DRIVE or use heavy machinery until tomorrow (because of the sedation medicines used during the test).    FOLLOW UP: Our staff will call the number listed on your records 48-72 hours following your procedure to check on you and address any questions or concerns that you may have regarding the information given to you following your procedure. If we do not reach you, we will leave a message.  We will attempt to reach you two times.  During this call, we will ask if you have developed any symptoms of COVID 19. If you develop any symptoms (ie: fever, flu-like symptoms, shortness of breath, cough etc.) before then, please call [phone].  If you test positive for Covid 19 in the 2 weeks post procedure, please call and report this information to us.    If any biopsies were taken you will be contacted by phone or by letter within the next 1-3 weeks.  Please call us at [phone] if you have not heard about the biopsies in 3 weeks.    SIGNATURES/CONFIDENTIALITY: You and/or your care partner have signed paperwork which will be entered into your electronic medical record.  These signatures attest to the fact that that the information above on your After Visit Summary has been reviewed and is understood.  Full responsibility of the confidentiality of this discharge information lies with you and/or your care-partner.  

## 2021-08-28 NOTE — Progress Notes (Signed)
[facility] History and Physical   Primary Care Physician:  Aretta Nip, MD   Reason for Procedure:  Colorectal cancer screening  Plan:    Screening colonoscopy with possible interventions as needed     HPI: [name] [name] is a very pleasant 50 y.o. female here for screening colonoscopy. Denies any nausea, vomiting, abdominal pain, melena or bright red blood per rectum  The risks and benefits as well as alternatives of endoscopic procedure(s) have been discussed and reviewed. All questions answered. The patient agrees to proceed.    Past Medical History:  Diagnosis Date   Asthma    Cancer [facility])    Family history of breast cancer 07/08/2020   Family history of ovarian cancer 07/08/2020   Genital herpes    History of radiation therapy 11/30/2020-12/29/2020   right breast   Dr Gery Pray    Past Surgical History:  Procedure Laterality Date   BREAST LUMPECTOMY WITH RADIOACTIVE SEED AND SENTINEL LYMPH NODE BIOPSY Right 07/28/2020   Procedure: RIGHT BREAST LUMPECTOMY X 3  WITH RADIOACTIVE SEED AND SENTINEL LYMPH NODE BIOPSY;  Surgeon: Jovita Kussmaul, MD;  Location: [facility];  Service: General;  Laterality: Right;  PEC BLOCK   FOOT SURGERY Right    MOUTH SURGERY Right     Prior to Admission medications   Medication Sig Start Date End Date Taking? Authorizing Provider  OVER THE COUNTER MEDICATION Wheat weight gainer all natural takes on the days she works out 3-4 x a week   Yes [provider]  tamoxifen (NOLVADEX) 20 MG tablet Take 20 mg by mouth daily.   Yes [provider]  Tetrahydrozoline HCl (VISINE OP) Place 1 drop into both eyes daily as needed (redness).    Yes [provider]  cholecalciferol (VITAMIN D3) 25 MCG (1000 UNIT) tablet Take 1 tablet (1,000 Units total) by mouth daily. Patient not taking: No sig reported 06/12/21   Magrinat, Virgie Dad, MD  gabapentin (NEURONTIN) 300 MG capsule Take 1 capsule (300 mg total) by mouth  at bedtime as needed. Patient not taking: No sig reported 06/13/21   Magrinat, Virgie Dad, MD  ibuprofen (ADVIL) 200 MG tablet Take 200-400 mg by mouth every 6 (six) hours as needed for headache or moderate pain.     [provider]  OVER THE COUNTER MEDICATION Bone density medication-? Name    [provider]    Current Outpatient Medications  Medication Sig Dispense Refill   OVER THE COUNTER MEDICATION Wheat weight gainer all natural takes on the days she works out 3-4 x a week     tamoxifen (NOLVADEX) 20 MG tablet Take 20 mg by mouth daily.     Tetrahydrozoline HCl (VISINE OP) Place 1 drop into both eyes daily as needed (redness).      cholecalciferol (VITAMIN D3) 25 MCG (1000 UNIT) tablet Take 1 tablet (1,000 Units total) by mouth daily. (Patient not taking: No sig reported)     gabapentin (NEURONTIN) 300 MG capsule Take 1 capsule (300 mg total) by mouth at bedtime as needed. (Patient not taking: No sig reported) 30 capsule 3   ibuprofen (ADVIL) 200 MG tablet Take 200-400 mg by mouth every 6 (six) hours as needed for headache or moderate pain.      OVER THE COUNTER MEDICATION Bone density medication-? Name     Current Facility-Administered Medications  Medication Dose Route Frequency Provider Last Rate Last Admin   0.9 %  sodium chloride infusion  500 mL Intravenous  Once Mauri Pole, MD        Allergies as of 08/28/2021 - Review Complete 08/28/2021  Allergen Reaction Noted   Keflex [cephalexin] Itching 11/21/2020   Shellfish allergy  07/15/2020    Family History  Problem Relation Age of Onset   Ovarian cancer Sister 93   CVA Other    Breast cancer Other        maternal grandmother's sister, dx 36s, d. 77s   Colon cancer Neg Hx    Colon polyps Neg Hx    Esophageal cancer Neg Hx    Rectal cancer Neg Hx    Stomach cancer Neg Hx     Social History   Socioeconomic History   Marital status: Married    Spouse name: Not on file   Number of children: Not  on file   Years of education: Not on file   Highest education level: Not on file  Occupational History   Not on file  Tobacco Use   Smoking status: Never   Smokeless tobacco: Never  Vaping Use   Vaping Use: Never used  Substance and Sexual Activity   Alcohol use: Yes    Comment: social   Drug use: No   Sexual activity: Yes  Other Topics Concern   Not on file  Social History Narrative   Not on file   Social Determinants of Health   Financial Resource Strain: Not on file  Food Insecurity: Not on file  Transportation Needs: Not on file  Physical Activity: Not on file  Stress: Not on file  Social Connections: Not on file  Intimate Partner Violence: Not on file    Review of Systems:  All other review of systems negative except as mentioned in the HPI.  Physical Exam: Vital signs in last 24 hours: BP 100/64   Pulse 70   Temp 98.4 F (36.9 C)   Ht 5\' 1"  (1.549 m)   Wt 138 lb (62.6 kg)   SpO2 100%   BMI 26.07 kg/m     General:   Alert, NAD Lungs:  Clear .   Heart:  Regular rate and rhythm Abdomen:  Soft, nontender and nondistended. Neuro/Psych:  Alert and cooperative. Normal mood and affect. A and O x 3  Reviewed labs, radiology imaging, old records and pertinent past GI work up  Patient is appropriate for planned procedure(s) and anesthesia in an ambulatory setting   K. Denzil Magnuson , MD [phone]

## 2021-08-28 NOTE — Progress Notes (Signed)
PT taken to PACU. Monitors in place. VSS. Report given to RN. 

## 2021-08-28 NOTE — Op Note (Signed)
[facility] Patient Name: Analyah [name] Procedure Date: 08/28/2021 9:54 AM MRN: [id number] Endoscopist: Mauri Pole , MD Age: 50 Referring MD:  Date of Birth: Mar 24, 1971 Gender: Female Account #: [id number] Procedure:                Colonoscopy Indications:              Screening for colorectal malignant neoplasm Medicines:                Monitored Anesthesia Care Procedure:                Pre-Anesthesia Assessment:                           - Prior to the procedure, a History and Physical                            was performed, and patient medications and                            allergies were reviewed. The patient's tolerance of                            previous anesthesia was also reviewed. The risks                            and benefits of the procedure and the sedation                            options and risks were discussed with the patient.                            All questions were answered, and informed consent                            was obtained. Prior Anticoagulants: The patient has                            taken no previous anticoagulant or antiplatelet                            agents. ASA Grade Assessment: II - A patient with                            mild systemic disease. After reviewing the risks                            and benefits, the patient was deemed in                            satisfactory condition to undergo the procedure.                           After obtaining informed consent, the colonoscope  was passed under direct vision. Throughout the                            procedure, the patient's blood pressure, pulse, and                            oxygen saturations were monitored continuously. The                            [id number]-HQ190L Colonoscope was introduced through the                            anus and advanced to the the cecum, identified by                             appendiceal orifice and ileocecal valve. The                            colonoscopy was performed without difficulty. The                            patient tolerated the procedure well. The quality                            of the bowel preparation was excellent. The                            ileocecal valve, appendiceal orifice, and rectum                            were photographed. Scope In: 10:01:50 AM Scope Out: 10:18:43 AM Scope Withdrawal Time: 0 hours 9 minutes 59 seconds  Total Procedure Duration: 0 hours 16 minutes 53 seconds  Findings:                 The perianal and digital rectal examinations were                            normal.                           A 5 mm polyp was found in the ascending colon. The                            polyp was sessile. The polyp was removed with a                            cold snare. Resection and retrieval were complete.                           Non-bleeding external and internal hemorrhoids were                            found during retroflexion. The hemorrhoids were  medium-sized. Complications:            No immediate complications. Estimated Blood Loss:     Estimated blood loss was minimal. Impression:               - One 5 mm polyp in the ascending colon, removed                            with a cold snare. Resected and retrieved.                           - Non-bleeding external and internal hemorrhoids. Recommendation:           - Patient has a contact number available for                            emergencies. The signs and symptoms of potential                            delayed complications were discussed with the                            patient. Return to normal activities tomorrow.                            Written discharge instructions were provided to the                            patient.                           - Resume previous diet.                           - Continue  present medications.                           - Await pathology results.                           - Repeat colonoscopy in 5-10 years for surveillance                            based on pathology results. Mauri Pole, MD 08/28/2021 10:22:13 AM This report has been signed electronically.

## 2021-08-28 NOTE — Progress Notes (Signed)
Pt's states no medical or surgical changes since previsit or office visit.  Vitals by DT 

## 2021-08-30 ENCOUNTER — Telehealth: Payer: Self-pay

## 2021-08-30 NOTE — Telephone Encounter (Signed)
  Follow up Call-  Call back number 08/28/2021  Post procedure Call Back phone  # [phone]  Permission to leave phone message Yes  Some recent data might be hidden     Patient questions:  Do you have a fever, pain , or abdominal swelling? No. Pain Score  0 *  Have you tolerated food without any problems? Yes.    Have you been able to return to your normal activities? Yes.    Do you have any questions about your discharge instructions: Diet   No. Medications  No. Follow up visit  No.  Do you have questions or concerns about your Care? No.  Actions: * If pain score is 4 or above: No action needed, pain <4.

## 2021-09-05 ENCOUNTER — Telehealth: Payer: Self-pay | Admitting: Gastroenterology

## 2021-09-05 NOTE — Telephone Encounter (Signed)
Patient called seeking path results 

## 2021-09-06 ENCOUNTER — Other Ambulatory Visit: Payer: Self-pay | Admitting: Oncology

## 2021-09-06 NOTE — Telephone Encounter (Signed)
She will receive a letter from Dr Silverio Decamp with the results and any recommendations.

## 2021-09-14 ENCOUNTER — Encounter: Payer: Self-pay | Admitting: Gastroenterology

## 2021-09-25 ENCOUNTER — Ambulatory Visit: Payer: 59 | Admitting: Oncology

## 2021-09-25 ENCOUNTER — Other Ambulatory Visit: Payer: 59

## 2021-09-25 NOTE — Progress Notes (Signed)
[place]  [phone] [phone]    ID: [name] [name] DOB: 1971-05-16  MR#: [id number]  HDQ#:[id number]  Patient Care Team: Aretta Nip, MD as PCP - General (Family Medicine) Jovita Kussmaul, MD as Consulting Physician (General Surgery) Bronnie Vasseur, Virgie Dad, MD as Consulting Physician (Oncology) Gery Pray, MD as Consulting Physician (Radiation Oncology) Janyth Pupa, DO as Consulting Physician (Obstetrics and Gynecology) Claris Pong, MD as Referring Physician (Hematology and Oncology) Chauncey Cruel, MD OTHER MD:  CHIEF COMPLAINT: Estrogen receptor positive breast cancer  CURRENT TREATMENT: tamoxifen   INTERVAL HISTORY: [name] [name] returns today for follow-up of her estrogen positive breast cancer accompanied by her husband [name].    She started tamoxifen on 01/10/2021.  She is generally tolerating this well.  She has had some taste changes which are probably not related.  She is having some nocturia which also is not related.  She is having minimal hot flashes.  She is having mild vaginal wetness.  Bone density 04/18/2021 showed a T score of -1.5.  This is discussed below  Of note since her last visit, she underwent screening colonoscopy on 08/28/2021 under Dr. Silverio Decamp. Pathology from the procedure [phone]) showed a tubular adenoma. Recommendation is for repeat colonoscopy in 7 years.   REVIEW OF SYSTEMS: [name] [name] goes to the gym at least twice a week and often 3.  She otherwise does not exercise.  She has a very good diet.  She feels like she is shrinking, although she understands her weight is stable.  A detailed review of systems today was otherwise stable.   COVID 19 VACCINATION STATUS: s/p Pfizer x 2, last dose July   HISTORY OF CURRENT ILLNESS: From the original intake note:  Varie Machamer herself noted a change in her right breast when exercising.  She thinks she first noticed this sometime in April  2021.  It was like an indentation when she AB duct at the right upper extremity.  Screening mammogram from 04/15/2020 was negative, with breast density category C.  However the problem became more of a palpable nontender lump and after evaluation by her primary care physician she underwent right breast ultrasonography at [facility] [place] on 06/23/2020 showing: 1.6 cm mass in right breast at 8 o'clock; 1.2 cm mass in right breast at 9 o'clock; the masses are 1.5 cm apart; single indeterminate mildly abnormal node in right axilla.  Accordingly on 06/27/2020 she proceeded to biopsy of the right breast areas in question. The pathology from this procedure ([id number]) showed: invasive and in situ mammary carcinoma, e-cadherin positive, grade 1-2. Both masses showed similar morphology. Prognostic indicators significant for: estrogen receptor, 100% positive with strong staining intensity and progesterone receptor, 90% positive with moderate-weak staining intensity. Proliferation marker Ki67 at 2%. HER2 equivocal by immunohistochemistry (2+), but negative by fluorescent in situ hybridization with a signals ratio 1.47 and number per cell 2.5.  The patient's subsequent history is as detailed below.    PAST MEDICAL HISTORY: Past Medical History:  Diagnosis Date   Asthma    Cancer [facility])    Family history of breast cancer 07/08/2020   Family history of ovarian cancer 07/08/2020   Genital herpes    History of radiation therapy 11/30/2020-12/29/2020   right breast   Dr Gery Pray    PAST SURGICAL HISTORY: Past Surgical History:  Procedure Laterality Date   BREAST LUMPECTOMY WITH RADIOACTIVE SEED AND SENTINEL LYMPH NODE BIOPSY Right 07/28/2020   Procedure: RIGHT BREAST LUMPECTOMY X 3  WITH RADIOACTIVE  SEED AND SENTINEL LYMPH NODE BIOPSY;  Surgeon: Jovita Kussmaul, MD;  Location: [place];  Service: General;  Laterality: Right;  PEC BLOCK   FOOT SURGERY Right    MOUTH SURGERY Right     FAMILY HISTORY: Family History  Problem  Relation Age of Onset   Ovarian cancer Sister 50   CVA Other    Breast cancer Other        maternal grandmother's sister, dx 73s, d. 39s   Colon cancer Neg Hx    Colon polyps Neg Hx    Esophageal cancer Neg Hx    Rectal cancer Neg Hx    Stomach cancer Neg Hx    Her father is 32, and her mother 58 as of 06/2020. [name] [name] has 3 brothers and 4 sisters. She reports ovarian cancer in her sister at age 57 (sister is doing well now) and breast cancer in a maternal great-aunt (post-menopausal).   GYNECOLOGIC HISTORY:  Last menstrual period August 2021  Menarche: 50 years old Age at first live birth: 50 years old [place] P 1 LMP periods are irregular Contraceptive: Patient's husband is status post vasectomy HRT n/a  Hysterectomy? no BSO? no   SOCIAL HISTORY: (updated 06/2020)  Tomasa Hosteller work as a Dentist. Husband Iona Beard "[name]" is a Airline pilot. She lives at home with [name]. Daughter Sherlie Ban, age 71, is a Education administrator here in [place]. [name] [name] attends [employer].    ADVANCED DIRECTIVES: In the absence of any documentation to the contrary, the patient's spouse is their HCPOA.    HEALTH MAINTENANCE: Social History   Tobacco Use   Smoking status: Never   Smokeless tobacco: Never  Vaping Use   Vaping Use: Never used  Substance Use Topics   Alcohol use: Yes    Comment: social   Drug use: No     Colonoscopy: 08/2021 (Dr. Silverio Decamp), repeat 2029  PAP: 01/2020  Bone density: 03/2021, -1.5   Allergies  Allergen Reactions   Keflex [Cephalexin] Itching   Shellfish Allergy     Throat and ear itching     Current Outpatient Medications  Medication Sig Dispense Refill   cholecalciferol (VITAMIN D3) 25 MCG (1000 UNIT) tablet Take 1 tablet (1,000 Units total) by mouth daily. (Patient not taking: No sig reported)     gabapentin (NEURONTIN) 300 MG capsule Take 1 capsule (300 mg total) by mouth at bedtime as needed. (Patient not taking: No sig reported) 30 capsule 3    ibuprofen (ADVIL) 200 MG tablet Take 200-400 mg by mouth every 6 (six) hours as needed for headache or moderate pain.      OVER THE COUNTER MEDICATION Wheat weight gainer all natural takes on the days she works out 3-4 x a week     OVER THE COUNTER MEDICATION Bone density medication-? Name     tamoxifen (NOLVADEX) 20 MG tablet TAKE 1 TABLET BY MOUTH EVERY DAY 30 tablet PRN   Tetrahydrozoline HCl (VISINE OP) Place 1 drop into both eyes daily as needed (redness).      No current facility-administered medications for this visit.    OBJECTIVE: African-American woman who appears younger than stated age  47:   09/26/21 0852  BP: 118/82  Pulse: 68  Resp: 16  Temp: 98.1 F (36.7 C)  SpO2: 100%     Body mass index is 26.7 kg/m.   Wt Readings from Last 3 Encounters:  09/26/21 141 lb 4.8 oz (64.1 kg)  08/28/21 138 lb (62.6 kg)  08/15/21  143 lb (64.9 kg)     ECOG FS:1 - Symptomatic but completely ambulatory  Sclerae unicteric, EOMs intact Wearing a mask No cervical or supraclavicular adenopathy Lungs no rales or rhonchi Heart regular rate and rhythm Abd soft, nontender, positive bowel sounds MSK no focal spinal tenderness, no upper extremity lymphedema Neuro: nonfocal, well oriented, appropriate affect Breasts: The right breast is status postlumpectomy and radiation.  The cosmetic result is very good.  There is the expected coarseness in the inferior portion particularly.  There is no evidence of local recurrence.  Left breast and both axillae are benign.   LAB RESULTS:  CMP     Component Value Date/Time   NA 143 06/12/2021 0957   K 4.1 06/12/2021 0957   CL 108 06/12/2021 0957   CO2 28 06/12/2021 0957   GLUCOSE 116 (H) 06/12/2021 0957   BUN 13 06/12/2021 0957   CREATININE 0.92 06/12/2021 0957   CREATININE 0.91 08/03/2020 1536   CALCIUM 9.2 06/12/2021 0957   PROT 6.6 06/12/2021 0957   ALBUMIN 3.6 06/12/2021 0957   AST 21 06/12/2021 0957   AST 47 (H) 08/03/2020 1536    ALT 15 06/12/2021 0957   ALT 58 (H) 08/03/2020 1536   ALKPHOS 85 06/12/2021 0957   BILITOT 0.6 06/12/2021 0957   BILITOT 0.4 08/03/2020 1536   GFRNONAA >60 06/12/2021 0957   GFRNONAA >60 08/03/2020 1536   GFRAA >60 08/03/2020 1536    No results found for: TOTALPROTELP, ALBUMINELP, A1GS, A2GS, BETS, BETA2SER, GAMS, MSPIKE, SPEI  Lab Results  Component Value Date   WBC 4.5 09/26/2021   NEUTROABS 2.3 09/26/2021   HGB 12.2 09/26/2021   HCT 35.6 (L) 09/26/2021   MCV 88.8 09/26/2021   PLT 284 09/26/2021    No results found for: LABCA2  No components found for: TMHDQQ229  No results for input(s): INR in the last 168 hours.  No results found for: LABCA2  No results found for: NLG921  No results found for: JHE174  No results found for: YCX448  No results found for: CA2729  No components found for: HGQUANT  No results found for: CEA1 / No results found for: CEA1   No results found for: AFPTUMOR  No results found for: CHROMOGRNA  No results found for: KPAFRELGTCHN, LAMBDASER, KAPLAMBRATIO (kappa/lambda light chains)  No results found for: HGBA, HGBA2QUANT, HGBFQUANT, HGBSQUAN (Hemoglobinopathy evaluation)   No results found for: LDH  No results found for: IRON, TIBC, IRONPCTSAT (Iron and TIBC)  No results found for: FERRITIN  Urinalysis    Component Value Date/Time   COLORURINE YELLOW 03/30/2021 0941   APPEARANCEUR CLEAR 03/30/2021 0941   LABSPEC 1.018 03/30/2021 0941   PHURINE 5.0 03/30/2021 [place] 03/30/2021 0941   HGBUR NEGATIVE 03/30/2021 [place] 03/30/2021 0941   KETONESUR NEGATIVE 03/30/2021 0941   PROTEINUR NEGATIVE 03/30/2021 0941   NITRITE NEGATIVE 03/30/2021 0941   LEUKOCYTESUR NEGATIVE 03/30/2021 0941    STUDIES: No results found.    ELIGIBLE FOR AVAILABLE RESEARCH PROTOCOL: AET  ASSESSMENT: 50 y.o. [place] woman status post right breast biopsy x2 on 06/27/2020 for an mT1c N0, stage IA  invasive ductal carcinoma, grade 1 or 2, estrogen and progesterone receptor positive, HER-2 not amplified, with an MIB-1 of 2%.  (a) an equivocal right axillary lymph node was biopsied 06/27/2020 and was benign  (b) chest CT scan 07/08/2020 showed no evidence of metastatic disease  (1) genetics testing recommended on 07/07/2020 but declined by patient  (  2)  S/p right lumpectomy 07/28/2020 for an  mpT1c pN0, stage IA invasive ductal carcinoma, grade 2, with negative margins   (a) a total of 4 right axillary lymph nodes removed, all clear  (3) Oncotype score of 28 predicts a risk of this breast cancer recurring outside the breast within the next 9 years of 17% if the patient's only systemic therapy is antiestrogens for 5 years.  It also predicts a significant benefit from chemotherapy.  (4) adjuvant chemotherapy consisting of docetaxel and cyclophosphamide given every 21 days x 4 started 09/02/2020, completed 11/04/2020 [facility])  (5) adjuvant radiation completed 12/29/2020  (6) tamoxifen started 01/10/2021  (a) consider goserelin/anastrozole if menstruation resumes post chemo  (b) bone density 04/18/2021 shows a T score of -1.5   PLAN: Maudean is now a little over a year out from definitive surgery for her breast cancer with no evidence of disease recurrence.  This is very favorable.  She is tolerating tamoxifen well and the plan will be to continue that a minimum of 5 years.  We reviewed the fact that tamoxifen does not take away her estrogen.  She is making just as much estrogen as she would if she were not on tamoxifen.  I think the feeling she is having of "fading away" is more related to menopause and we did review the fact that there is significant weight gain associated with menopause.  She is managing very well currently with her diet and exercise.  We discussed her bone density at length.  She has very mild osteopenia.  If she wants to improve that of course she will continue  vitamin D and she will start a walking program the goal being 70 500-10,000 steps a day.  She understands tamoxifen will help with bone density as well.  She will have mammography in May.  She will see [place] again in June and from that point she will start seeing [place] on a once a year basis alternating with her surgeon Dr. Marlou Starks  Total encounter time 30 minutes.Sarajane Jews C. Kaiyana Bedore, MD 09/26/21 9:03 AM Medical Oncology and Hematology [facility] [place], [place] [address] Tel. [phone]    Fax. [phone]   I, Wilburn Mylar, am acting as scribe for Dr. Virgie Dad. Starlyn Droge.  I, Lurline Del MD, have reviewed the above documentation for accuracy and completeness, and I agree with the above.   *Total Encounter Time as defined by the Centers for Medicare and Medicaid Services includes, in addition to the face-to-face time of a patient visit (documented in the note above) non-face-to-face time: obtaining and reviewing outside history, ordering and reviewing medications, tests or procedures, care coordination (communications with other health care professionals or caregivers) and documentation in the medical record.

## 2021-09-26 ENCOUNTER — Inpatient Hospital Stay: Payer: 59 | Attending: Oncology

## 2021-09-26 ENCOUNTER — Other Ambulatory Visit: Payer: Self-pay

## 2021-09-26 ENCOUNTER — Encounter: Payer: Self-pay | Admitting: Oncology

## 2021-09-26 ENCOUNTER — Inpatient Hospital Stay: Payer: 59 | Admitting: Oncology

## 2021-09-26 VITALS — BP 118/82 | HR 68 | Temp 98.1°F | Resp 16 | Ht 61.0 in | Wt 141.3 lb

## 2021-09-26 DIAGNOSIS — C50811 Malignant neoplasm of overlapping sites of right female breast: Secondary | ICD-10-CM | POA: Insufficient documentation

## 2021-09-26 DIAGNOSIS — M8580 Other specified disorders of bone density and structure, unspecified site: Secondary | ICD-10-CM | POA: Insufficient documentation

## 2021-09-26 DIAGNOSIS — Z170 Estrogen receptor positive status [ER+]: Secondary | ICD-10-CM

## 2021-09-26 DIAGNOSIS — Z79810 Long term (current) use of selective estrogen receptor modulators (SERMs): Secondary | ICD-10-CM | POA: Diagnosis not present

## 2021-09-26 LAB — COMPREHENSIVE METABOLIC PANEL
ALT: 16 U/L (ref 0–44)
AST: 26 U/L (ref 15–41)
Albumin: 3.8 g/dL (ref 3.5–5.0)
Alkaline Phosphatase: 74 U/L (ref 38–126)
Anion gap: 8 (ref 5–15)
BUN: 13 mg/dL (ref 6–20)
CO2: 23 mmol/L (ref 22–32)
Calcium: 8.6 mg/dL — ABNORMAL LOW (ref 8.9–10.3)
Chloride: 111 mmol/L (ref 98–111)
Creatinine, Ser: 0.85 mg/dL (ref 0.44–1.00)
GFR, Estimated: >60 mL/min (ref >60)
Glucose, Bld: 98 mg/dL (ref 70–99)
Potassium: 4.3 mmol/L (ref 3.5–5.1)
Sodium: 142 mmol/L (ref 135–145)
Total Bilirubin: 0.4 mg/dL (ref 0.3–1.2)
Total Protein: 6.6 g/dL (ref 6.5–8.1)

## 2021-09-26 LAB — CBC WITH DIFFERENTIAL/PLATELET
Abs Immature Granulocytes: 0.01 10*3/uL (ref 0.00–0.07)
Basophils Absolute: 0 10*3/uL (ref 0.0–0.1)
Basophils Relative: 1 %
Eosinophils Absolute: 0 10*3/uL (ref 0.0–0.5)
Eosinophils Relative: 1 %
HCT: 35.6 % — ABNORMAL LOW (ref 36.0–46.0)
Hemoglobin: 12.2 g/dL (ref 12.0–15.0)
Immature Granulocytes: 0 %
Lymphocytes Relative: 39 %
Lymphs Abs: 1.8 10*3/uL (ref 0.7–4.0)
MCH: 30.4 pg (ref 26.0–34.0)
MCHC: 34.3 g/dL (ref 30.0–36.0)
MCV: 88.8 fL (ref 80.0–100.0)
Monocytes Absolute: 0.4 10*3/uL (ref 0.1–1.0)
Monocytes Relative: 8 %
Neutro Abs: 2.3 10*3/uL (ref 1.7–7.7)
Neutrophils Relative %: 51 %
Platelets: 284 10*3/uL (ref 150–400)
RBC: 4.01 MIL/uL (ref 3.87–5.11)
RDW: 11.3 % — ABNORMAL LOW (ref 11.5–15.5)
WBC: 4.5 10*3/uL (ref 4.0–10.5)
nRBC: 0 % (ref 0.0–0.2)

## 2021-09-26 NOTE — Telephone Encounter (Signed)
Called patient to discuss My Chart question. Her glucose was WNL this morning.

## 2021-09-27 ENCOUNTER — Encounter: Payer: Self-pay | Admitting: Oncology

## 2021-09-28 ENCOUNTER — Telehealth: Payer: Self-pay

## 2021-09-28 NOTE — Telephone Encounter (Signed)
Attempted to call pt per Mychart message. Pt called and LVM stating she has also developed a h/a and some back pain. I once again attempted to call pt. She did not answer, LVM for return call .

## 2021-10-02 ENCOUNTER — Other Ambulatory Visit: Payer: Self-pay | Admitting: Oncology

## 2021-10-02 NOTE — Progress Notes (Signed)
[name] [name] had called about hands feeling a bit numb but actually what she tells me is that she felt a little strange for a couple of days on November 11 and 12.  She is having some urinary frequency and a sense that she needs to go and then there is nothing there.  She did see her primary care physician regarding this and there is some lab work pending.  Apparently they did not obtain a urinalysis.  She will let us know if the problem continues and if so we will set her up for urinalysis and culture.  However she may simply be having some bladder spasms and might benefit from seeing urology if this persists.

## 2021-10-04 ENCOUNTER — Encounter: Payer: Self-pay | Admitting: Oncology

## 2021-10-09 ENCOUNTER — Other Ambulatory Visit: Payer: Self-pay

## 2021-10-09 ENCOUNTER — Other Ambulatory Visit: Payer: Self-pay | Admitting: *Deleted

## 2021-10-09 ENCOUNTER — Inpatient Hospital Stay: Payer: 59

## 2021-10-09 DIAGNOSIS — R300 Dysuria: Secondary | ICD-10-CM

## 2021-10-09 DIAGNOSIS — Z170 Estrogen receptor positive status [ER+]: Secondary | ICD-10-CM

## 2021-10-09 DIAGNOSIS — C50811 Malignant neoplasm of overlapping sites of right female breast: Secondary | ICD-10-CM | POA: Diagnosis not present

## 2021-10-09 LAB — URINALYSIS, COMPLETE (UACMP) WITH MICROSCOPIC
Bilirubin Urine: NEGATIVE
Glucose, UA: NEGATIVE mg/dL
Hgb urine dipstick: NEGATIVE
Ketones, ur: NEGATIVE mg/dL
Leukocytes,Ua: NEGATIVE
Nitrite: NEGATIVE
Protein, ur: NEGATIVE mg/dL
Specific Gravity, Urine: 1.018 (ref 1.005–1.030)
pH: 5 (ref 5.0–8.0)

## 2021-10-10 ENCOUNTER — Telehealth: Payer: Self-pay | Admitting: *Deleted

## 2021-10-10 LAB — URINE CULTURE

## 2021-10-10 NOTE — Telephone Encounter (Signed)
This RN reviewed with pt results of U/A not indicating pt has a bacterial infection. Culture shows contamination of sample.  This RN answered the patient's questions regarding results.  Of note pt states she has been seen by a urologist recently and has been prescribed medication for her symptoms " but wanted to make sure I wasn't masking any issues by taking the medication"  Pt was prescribed oxybutrin.  Pt stated understanding and has no further questionsl

## 2021-10-15 ENCOUNTER — Encounter: Payer: Self-pay | Admitting: Oncology

## 2021-10-15 DIAGNOSIS — R300 Dysuria: Secondary | ICD-10-CM

## 2021-10-16 ENCOUNTER — Encounter: Payer: Self-pay | Admitting: Oncology

## 2021-10-18 ENCOUNTER — Other Ambulatory Visit: Payer: Self-pay

## 2021-10-18 ENCOUNTER — Other Ambulatory Visit: Payer: Self-pay | Admitting: Oncology

## 2021-10-18 ENCOUNTER — Inpatient Hospital Stay: Payer: 59

## 2021-10-18 DIAGNOSIS — Z170 Estrogen receptor positive status [ER+]: Secondary | ICD-10-CM

## 2021-10-18 DIAGNOSIS — C50811 Malignant neoplasm of overlapping sites of right female breast: Secondary | ICD-10-CM | POA: Diagnosis not present

## 2021-10-18 DIAGNOSIS — R300 Dysuria: Secondary | ICD-10-CM

## 2021-10-18 LAB — CMP (CANCER CENTER ONLY)
ALT: 52 U/L — ABNORMAL HIGH (ref 0–44)
AST: 113 U/L — ABNORMAL HIGH (ref 15–41)
Albumin: 3.9 g/dL (ref 3.5–5.0)
Alkaline Phosphatase: 69 U/L (ref 38–126)
Anion gap: 3 — ABNORMAL LOW (ref 5–15)
BUN: 12 mg/dL (ref 6–20)
CO2: 27 mmol/L (ref 22–32)
Calcium: 8.9 mg/dL (ref 8.9–10.3)
Chloride: 107 mmol/L (ref 98–111)
Creatinine: 0.91 mg/dL (ref 0.44–1.00)
GFR, Estimated: >60 mL/min (ref >60)
Glucose, Bld: 98 mg/dL (ref 70–99)
Potassium: 4 mmol/L (ref 3.5–5.1)
Sodium: 137 mmol/L (ref 135–145)
Total Bilirubin: 0.6 mg/dL (ref 0.3–1.2)
Total Protein: 6.7 g/dL (ref 6.5–8.1)

## 2021-10-18 LAB — CBC WITH DIFFERENTIAL/PLATELET
Abs Immature Granulocytes: 0 10*3/uL (ref 0.00–0.07)
Basophils Absolute: 0 10*3/uL (ref 0.0–0.1)
Basophils Relative: 1 %
Eosinophils Absolute: 0 10*3/uL (ref 0.0–0.5)
Eosinophils Relative: 1 %
HCT: 35.5 % — ABNORMAL LOW (ref 36.0–46.0)
Hemoglobin: 12.3 g/dL (ref 12.0–15.0)
Immature Granulocytes: 0 %
Lymphocytes Relative: 49 %
Lymphs Abs: 1.6 10*3/uL (ref 0.7–4.0)
MCH: 30.8 pg (ref 26.0–34.0)
MCHC: 34.6 g/dL (ref 30.0–36.0)
MCV: 88.8 fL (ref 80.0–100.0)
Monocytes Absolute: 0.3 10*3/uL (ref 0.1–1.0)
Monocytes Relative: 11 %
Neutro Abs: 1.2 10*3/uL — ABNORMAL LOW (ref 1.7–7.7)
Neutrophils Relative %: 38 %
Platelets: 259 10*3/uL (ref 150–400)
RBC: 4 MIL/uL (ref 3.87–5.11)
RDW: 11.6 % (ref 11.5–15.5)
WBC: 3.2 10*3/uL — ABNORMAL LOW (ref 4.0–10.5)
nRBC: 0 % (ref 0.0–0.2)

## 2021-10-18 LAB — URINALYSIS, COMPLETE (UACMP) WITH MICROSCOPIC
Bilirubin Urine: NEGATIVE
Glucose, UA: NEGATIVE mg/dL
Hgb urine dipstick: NEGATIVE
Ketones, ur: NEGATIVE mg/dL
Leukocytes,Ua: NEGATIVE
Nitrite: NEGATIVE
Protein, ur: NEGATIVE mg/dL
Specific Gravity, Urine: 1.015 (ref 1.005–1.030)
pH: 5 (ref 5.0–8.0)

## 2021-10-19 ENCOUNTER — Encounter: Payer: Self-pay | Admitting: Oncology

## 2021-10-19 LAB — URINE CULTURE: Culture: 30000 — AB

## 2021-10-20 ENCOUNTER — Other Ambulatory Visit: Payer: Self-pay

## 2021-10-20 ENCOUNTER — Telehealth: Payer: Self-pay

## 2021-10-20 ENCOUNTER — Inpatient Hospital Stay: Payer: 59 | Attending: Oncology | Admitting: Adult Health

## 2021-10-20 VITALS — BP 117/77 | HR 70 | Temp 97.7°F | Resp 18 | Ht 61.0 in | Wt 143.2 lb

## 2021-10-20 DIAGNOSIS — N390 Urinary tract infection, site not specified: Secondary | ICD-10-CM | POA: Diagnosis not present

## 2021-10-20 DIAGNOSIS — M546 Pain in thoracic spine: Secondary | ICD-10-CM

## 2021-10-20 DIAGNOSIS — R7989 Other specified abnormal findings of blood chemistry: Secondary | ICD-10-CM

## 2021-10-20 DIAGNOSIS — M549 Dorsalgia, unspecified: Secondary | ICD-10-CM | POA: Insufficient documentation

## 2021-10-20 DIAGNOSIS — C50811 Malignant neoplasm of overlapping sites of right female breast: Secondary | ICD-10-CM | POA: Diagnosis not present

## 2021-10-20 DIAGNOSIS — N3000 Acute cystitis without hematuria: Secondary | ICD-10-CM | POA: Diagnosis not present

## 2021-10-20 DIAGNOSIS — Z170 Estrogen receptor positive status [ER+]: Secondary | ICD-10-CM | POA: Diagnosis not present

## 2021-10-20 MED ORDER — AMOXICILLIN 875 MG PO TABS: 875.0000 mg | ORAL_TABLET | Freq: Two times a day (BID) | ORAL | 0 refills | Status: DC

## 2021-10-20 NOTE — Telephone Encounter (Signed)
Called pt and left VM requesting pt call back so that we can schedule her to be seen today with Mendel Ryder if possible.

## 2021-10-23 ENCOUNTER — Ambulatory Visit (HOSPITAL_COMMUNITY)
Admission: RE | Admit: 2021-10-23 | Discharge: 2021-10-23 | Disposition: A | Discharge status: Home / Self Care | Payer: 59 | Source: Ambulatory Visit | Attending: Adult Health | Admitting: Adult Health

## 2021-10-23 ENCOUNTER — Encounter: Payer: Self-pay | Admitting: Adult Health

## 2021-10-23 ENCOUNTER — Telehealth: Payer: Self-pay

## 2021-10-23 ENCOUNTER — Other Ambulatory Visit: Payer: Self-pay

## 2021-10-23 DIAGNOSIS — R7989 Other specified abnormal findings of blood chemistry: Secondary | ICD-10-CM | POA: Insufficient documentation

## 2021-10-23 DIAGNOSIS — M546 Pain in thoracic spine: Secondary | ICD-10-CM | POA: Insufficient documentation

## 2021-10-23 DIAGNOSIS — Z170 Estrogen receptor positive status [ER+]: Secondary | ICD-10-CM

## 2021-10-23 DIAGNOSIS — N3000 Acute cystitis without hematuria: Secondary | ICD-10-CM | POA: Diagnosis present

## 2021-10-23 DIAGNOSIS — C50811 Malignant neoplasm of overlapping sites of right female breast: Secondary | ICD-10-CM | POA: Insufficient documentation

## 2021-10-23 NOTE — Telephone Encounter (Signed)
Pt called this RN regarding where her imaging was located. Pt arrived to x-ray appointment successfully. No further concerns.

## 2021-10-24 ENCOUNTER — Encounter: Payer: Self-pay | Admitting: Oncology

## 2021-10-24 ENCOUNTER — Encounter: Payer: Self-pay | Admitting: Adult Health

## 2021-10-24 ENCOUNTER — Telehealth: Payer: Self-pay

## 2021-10-24 ENCOUNTER — Ambulatory Visit (HOSPITAL_BASED_OUTPATIENT_CLINIC_OR_DEPARTMENT_OTHER): Payer: 59 | Admitting: Adult Health

## 2021-10-24 DIAGNOSIS — C50811 Malignant neoplasm of overlapping sites of right female breast: Secondary | ICD-10-CM

## 2021-10-24 DIAGNOSIS — Z170 Estrogen receptor positive status [ER+]: Secondary | ICD-10-CM | POA: Diagnosis not present

## 2021-10-24 NOTE — Progress Notes (Signed)
[facility]  [phone]) [phone] [phone]    ID: [name] [name] DOB: 11/19/1971  MR#: [id number]  YFV#:[id number]  Patient Care Team: Aretta Nip, MD as PCP - General (Family Medicine) Jovita Kussmaul, MD as Consulting Physician (General Surgery) Magrinat, Virgie Dad, MD as Consulting Physician (Oncology) Gery Pray, MD as Consulting Physician (Radiation Oncology) Janyth Pupa, DO as Consulting Physician (Obstetrics and Gynecology) Claris Pong, MD as Referring Physician (Hematology and Oncology) Scot Dock, NP OTHER MD:  I connected with [name] [name] on 10/24/21 at  2:00 PM EST by telephone and verified that I am speaking with the correct person using two identifiers.  I discussed the limitations, risks, security and privacy concerns of performing an evaluation and management service by telephone and the availability of in person appointments.  I also discussed with the patient that there may be a patient responsible charge related to this service. The patient expressed understanding and agreed to proceed.    CHIEF COMPLAINT: Estrogen receptor positive breast cancer  CURRENT TREATMENT: tamoxifen   INTERVAL HISTORY: [name] [name] returns today for follow-up of her estrogen positive breast cancer to review the results of her x-ray imaging and abdominal ultrasound and plain films.  Imaging is negative not showing any cause of the pain she is experiencing, however abdominal imaging shows cholelithiasis.  I am concerned that her liver enzymes have been elevated and she has these gallstones.  I reviewed these with her and that concerned in detail.  Her urinary issues and  REVIEW OF SYSTEMS: Review of Systems  Constitutional:  Negative for appetite change, chills, fatigue, fever and unexpected weight change.  HENT:   Negative for hearing loss, lump/mass and trouble swallowing.   Eyes:  Negative for eye problems and icterus.   Respiratory:  Negative for chest tightness, cough and shortness of breath.   Cardiovascular:  Negative for chest pain, leg swelling and palpitations.  Gastrointestinal:  Negative for abdominal distention, abdominal pain, constipation, diarrhea, nausea and vomiting.  Endocrine: Negative for hot flashes.  Genitourinary:  Negative for difficulty urinating and frequency.   Musculoskeletal:  Negative for arthralgias.  Skin:  Negative for itching and rash.  Neurological:  Negative for dizziness, extremity weakness, headaches and numbness.  Hematological:  Negative for adenopathy. Does not bruise/bleed easily.  Psychiatric/Behavioral:  Negative for depression. The patient is not nervous/anxious.      COVID 19 VACCINATION STATUS: s/[name] Pfizer x 2, last dose July   HISTORY OF CURRENT ILLNESS: From the original intake note:  Majestic Molony herself noted a change in her right breast when exercising.  She thinks she first noticed this sometime in April 2021.  It was like an indentation when she AB duct at the right upper extremity.  Screening mammogram from 04/15/2020 was negative, with breast density category C.  However the problem became more of a palpable nontender lump and after evaluation by her primary care physician she underwent right breast ultrasonography at [facility] on 06/23/2020 showing: 1.6 cm mass in right breast at 8 o'clock; 1.2 cm mass in right breast at 9 o'clock; the masses are 1.5 cm apart; single indeterminate mildly abnormal node in right axilla.  Accordingly on 06/27/2020 she proceeded to biopsy of the right breast areas in question. The pathology from this procedure ([id number]) showed: invasive and in situ mammary carcinoma, e-cadherin positive, grade 1-2. Both masses showed similar morphology. Prognostic indicators significant for: estrogen receptor, 100% positive with strong staining intensity and progesterone receptor, 90% positive with moderate-weak  staining intensity. Proliferation  marker Ki67 at 2%. HER2 equivocal by immunohistochemistry (2+), but negative by fluorescent in situ hybridization with a signals ratio 1.47 and number per cell 2.5.  The patient's subsequent history is as detailed below.    PAST MEDICAL HISTORY: Past Medical History:  Diagnosis Date   Asthma    Cancer ([facility] [place])    Family history of breast cancer 07/08/2020   Family history of ovarian cancer 07/08/2020   Genital herpes    History of radiation therapy 11/30/2020-12/29/2020   right breast   Dr Gery Pray    PAST SURGICAL HISTORY: Past Surgical History:  Procedure Laterality Date   BREAST LUMPECTOMY WITH RADIOACTIVE SEED AND SENTINEL LYMPH NODE BIOPSY Right 07/28/2020   Procedure: RIGHT BREAST LUMPECTOMY X 3  WITH RADIOACTIVE SEED AND SENTINEL LYMPH NODE BIOPSY;  Surgeon: Jovita Kussmaul, MD;  Location: [facility];  Service: General;  Laterality: Right;  PEC BLOCK   FOOT SURGERY Right    MOUTH SURGERY Right     FAMILY HISTORY: Family History  Problem Relation Age of Onset   Ovarian cancer Sister 80   CVA Other    Breast cancer Other        maternal grandmother's sister, dx 74s, d. 14s   Colon cancer Neg Hx    Colon polyps Neg Hx    Esophageal cancer Neg Hx    Rectal cancer Neg Hx    Stomach cancer Neg Hx    Her father is 18, and her mother 28 as of 06/2020. [name] [name] has 3 brothers and 4 sisters. She reports ovarian cancer in her sister at age 44 (sister is doing well now) and breast cancer in a maternal great-aunt (post-menopausal).   GYNECOLOGIC HISTORY:  Last menstrual period August 2021  Menarche: 50 years old Age at first live birth: 50 years old [name] [name] 1 LMP periods are irregular Contraceptive: Patient's husband is status post vasectomy HRT n/a  Hysterectomy? no BSO? no   SOCIAL HISTORY: (updated 06/2020)  [name] [name] work as a Dentist. Husband [name] [name] "[name] [name]" is a Airline pilot. She lives at home with [name] [name]. Daughter [name] [name], age 73, is a Education administrator here in  [place]. [name] [name] attends [employer].    ADVANCED DIRECTIVES: In the absence of any documentation to the contrary, the patient's spouse is their HCPOA.    HEALTH MAINTENANCE: Social History   Tobacco Use   Smoking status: Never   Smokeless tobacco: Never  Vaping Use   Vaping Use: Never used  Substance Use Topics   Alcohol use: Yes    Comment: social   Drug use: No     Colonoscopy: 08/2021 (Dr. Silverio Decamp), repeat 2029  PAP: 01/2020  Bone density: 03/2021, -1.5   Allergies  Allergen Reactions   Keflex [Cephalexin] Itching   Shellfish Allergy     Throat and ear itching     Current Outpatient Medications  Medication Sig Dispense Refill   amoxicillin (AMOXIL) 875 MG tablet Take 1 tablet (875 mg total) by mouth 2 (two) times daily. 14 tablet 0   cholecalciferol (VITAMIN D3) 25 MCG (1000 UNIT) tablet Take 1 tablet (1,000 Units total) by mouth daily. (Patient not taking: No sig reported)     ibuprofen (ADVIL) 200 MG tablet Take 200-400 mg by mouth every 6 (six) hours as needed for headache or moderate pain.      oxybutynin (DITROPAN-XL) 10 MG 24 hr tablet Take 10 mg by mouth daily.     tamoxifen (NOLVADEX) 20 MG  tablet TAKE 1 TABLET BY MOUTH EVERY DAY 30 tablet PRN   Tetrahydrozoline HCl (VISINE OP) Place 1 drop into both eyes daily as needed (redness).      No current facility-administered medications for this visit.    OBJECTIVE:   There were no vitals filed for this visit.    There is no height or weight on file to calculate BMI.   Wt Readings from Last 3 Encounters:  10/20/21 143 lb 3.2 oz (65 kg)  09/26/21 141 lb 4.8 oz (64.1 kg)  08/28/21 138 lb (62.6 kg)     ECOG FS:1 - Symptomatic but completely ambulatory Telephone visit patient sounds well without acute distress.  Her breathing is nonlabored.  Her speech is normal.   LAB RESULTS:  CMP     Component Value Date/Time   NA 137 10/18/2021 0849   K 4.0 10/18/2021 0849   CL 107 10/18/2021 0849   CO2 27  10/18/2021 0849   GLUCOSE 98 10/18/2021 0849   BUN 12 10/18/2021 0849   CREATININE 0.91 10/18/2021 0849   CALCIUM 8.9 10/18/2021 0849   PROT 6.7 10/18/2021 0849   ALBUMIN 3.9 10/18/2021 0849   AST 113 (H) 10/18/2021 0849   ALT 52 (H) 10/18/2021 0849   ALKPHOS 69 10/18/2021 0849   BILITOT 0.6 10/18/2021 0849   GFRNONAA >60 10/18/2021 0849   GFRAA >60 08/03/2020 1536    No results found for: TOTALPROTELP, ALBUMINELP, A1GS, A2GS, BETS, BETA2SER, GAMS, MSPIKE, SPEI  Lab Results  Component Value Date   WBC 3.2 (L) 10/18/2021   NEUTROABS 1.2 (L) 10/18/2021   HGB 12.3 10/18/2021   HCT 35.5 (L) 10/18/2021   MCV 88.8 10/18/2021   PLT 259 10/18/2021    No results found for: LABCA2  No components found for: YQIHKV425  No results for input(s): INR in the last 168 hours.  No results found for: LABCA2  No results found for: ZDG387  No results found for: FIE332  No results found for: RJJ884  No results found for: CA2729  No components found for: HGQUANT  No results found for: CEA1 / No results found for: CEA1   No results found for: AFPTUMOR  No results found for: CHROMOGRNA  No results found for: KPAFRELGTCHN, LAMBDASER, KAPLAMBRATIO (kappa/lambda light chains)  No results found for: HGBA, HGBA2QUANT, HGBFQUANT, HGBSQUAN (Hemoglobinopathy evaluation)   No results found for: LDH  No results found for: IRON, TIBC, IRONPCTSAT (Iron and TIBC)  No results found for: FERRITIN  Urinalysis    Component Value Date/Time   COLORURINE YELLOW 10/18/2021 0849   APPEARANCEUR CLEAR 10/18/2021 0849   LABSPEC 1.015 10/18/2021 0849   PHURINE 5.0 10/18/2021 [place] 10/18/2021 [place] 10/18/2021 [place] 10/18/2021 [place] 10/18/2021 0849   PROTEINUR NEGATIVE 10/18/2021 0849   NITRITE NEGATIVE 10/18/2021 0849   LEUKOCYTESUR NEGATIVE 10/18/2021 0849    STUDIES:     ELIGIBLE FOR AVAILABLE RESEARCH  PROTOCOL: AET  ASSESSMENT: 50 y.o. [place] woman status post right breast biopsy x2 on 06/27/2020 for an mT1c N0, stage IA invasive ductal carcinoma, grade 1 or 2, estrogen and progesterone receptor positive, HER-2 not amplified, with an MIB-1 of 2%.  (a) an equivocal right axillary lymph node was biopsied 06/27/2020 and was benign  (b) chest CT scan 07/08/2020 showed no evidence of metastatic disease  (1) genetics testing recommended on 07/07/2020 but declined by patient  (2)  S/[name] right lumpectomy 07/28/2020 for  an  mpT1c pN0, stage IA invasive ductal carcinoma, grade 2, with negative margins   (a) a total of 4 right axillary lymph nodes removed, all clear  (3) Oncotype score of 28 predicts a risk of this breast cancer recurring outside the breast within the next 9 years of 17% if the patient's only systemic therapy is antiestrogens for 5 years.  It also predicts a significant benefit from chemotherapy.  (4) adjuvant chemotherapy consisting of docetaxel and cyclophosphamide given every 21 days x 4 started 09/02/2020, completed 11/04/2020 [facility])  (5) adjuvant radiation completed 12/29/2020  (6) tamoxifen started 01/10/2021  (a) consider goserelin/anastrozole if menstruation resumes post chemo  (b) bone density 04/18/2021 shows a T score of -1.5   PLAN: Lamerle and I reviewed her imaging results one by one.  She read through them in my chart as I discussed them with her.  We reviewed her x-ray images were normal showing no acute reason for her back pain.  We reviewed her abdominal ultrasound which showed gallstones.  Considering the LFT increase she could have intermittent obstruction in her gallbladder from gallstones.  I will recommend that she talk to a surgeon to hear their thoughts on if she needs further imaging or surgery.  She does not want to meet with the surgeon.  At then it is suggested she follow-up with GI, she preferred to recheck liver enzymes in 4 weeks.  I have placed  those orders and her request for her to come back for a lab only visit at that time.  Should anything worsen in the meantime she knows to call us.   The patient was provided an opportunity to ask questions and all were answered. The patient agreed with the plan and demonstrated an understanding of the instructions.   The patient was advised to call back or seek an in-person evaluation if the symptoms worsen or if the condition fails to improve as anticipated.   I provided 15 minutes of non face-to-face telephone visit time during this encounter, and > 50% was spent counseling as documented under my assessment & plan.   Wilber Bihari, NP 10/24/21 2:09 PM Medical Oncology and Hematology [facility] [place], [place] [address] Tel. [phone]    Fax. [phone]    *Total Encounter Time as defined by the Centers for Medicare and Medicaid Services includes, in addition to the face-to-face time of a patient visit (documented in the note above) non-face-to-face time: obtaining and reviewing outside history, ordering and reviewing medications, tests or procedures, care coordination (communications with other health care professionals or caregivers) and documentation in the medical record.

## 2021-10-24 NOTE — Telephone Encounter (Signed)
Called and LV for pt to return my call.  Needing to schedule a phone visit today with Mendel Ryder

## 2021-10-24 NOTE — Progress Notes (Signed)
[place]  [phone] [phone]    ID: [name] [name] DOB: 1971/01/31  MR#: [id number]  LOV#:[id number]  Patient Care Team: Aretta Nip, MD as PCP - General (Family Medicine) Jovita Kussmaul, MD as Consulting Physician (General Surgery) Magrinat, Virgie Dad, MD as Consulting Physician (Oncology) Gery Pray, MD as Consulting Physician (Radiation Oncology) Janyth Pupa, DO as Consulting Physician (Obstetrics and Gynecology) Claris Pong, MD as Referring Physician (Hematology and Oncology) Scot Dock, NP OTHER MD:  CHIEF COMPLAINT: Estrogen receptor positive breast cancer  CURRENT TREATMENT: tamoxifen   INTERVAL HISTORY: [name] [name] returns today for follow-up of her estrogen positive breast cancer accompanied by her husband [name] [name].    She continues on tamoxifen daily.  She is concerned because she has been having some back pain.  She is worried what this might be.  She notes that she had physical issues with her breast prior to her mammogram and her mammogram was negative.  She reviewed with me that she had to push that issue in order to do for her breast cancer to be discovered and she has continued to have a caper herself since that time.  She is worried about what is going on inside her body and whether it could be a possible recurrence.  Her labs today show a slight elevation in her transaminases.  Her labs also showed a urinary tract infection with strep.  She has been noticing an increase in urinary frequency and hesitancy.  REVIEW OF SYSTEMS: Review of Systems  Constitutional:  Negative for appetite change, chills, fatigue, fever and unexpected weight change.  HENT:   Negative for hearing loss, lump/mass and trouble swallowing.   Eyes:  Negative for eye problems and icterus.  Respiratory:  Negative for chest tightness, cough and shortness of breath.   Cardiovascular:  Negative for chest pain, leg swelling and  palpitations.  Gastrointestinal:  Negative for abdominal distention, abdominal pain, constipation, diarrhea, nausea and vomiting.  Endocrine: Negative for hot flashes.  Genitourinary:  Positive for frequency. Negative for difficulty urinating.   Musculoskeletal:  Negative for arthralgias.  Skin:  Negative for itching and rash.  Neurological:  Negative for dizziness, extremity weakness, headaches and numbness.  Hematological:  Negative for adenopathy. Does not bruise/bleed easily.  Psychiatric/Behavioral:  Negative for depression. The patient is not nervous/anxious.      COVID 19 VACCINATION STATUS: s/p Pfizer x 2, last dose July   HISTORY OF CURRENT ILLNESS: From the original intake note:  [name] [name] herself noted a change in her right breast when exercising.  She thinks she first noticed this sometime in April 2021.  It was like an indentation when she AB duct at the right upper extremity.  Screening mammogram from 04/15/2020 was negative, with breast density category C.  However the problem became more of a palpable nontender lump and after evaluation by her primary care physician she underwent right breast ultrasonography at [facility] on 06/23/2020 showing: 1.6 cm mass in right breast at 8 o'clock; 1.2 cm mass in right breast at 9 o'clock; the masses are 1.5 cm apart; single indeterminate mildly abnormal node in right axilla.  Accordingly on 06/27/2020 she proceeded to biopsy of the right breast areas in question. The pathology from this procedure ([id number]) showed: invasive and in situ mammary carcinoma, e-cadherin positive, grade 1-2. Both masses showed similar morphology. Prognostic indicators significant for: estrogen receptor, 100% positive with strong staining intensity and progesterone receptor, 90% positive with moderate-weak staining intensity. Proliferation marker Ki67  at 2%. HER2 equivocal by immunohistochemistry (2+), but negative by fluorescent in situ hybridization with a signals  ratio 1.47 and number per cell 2.5.  The patient's subsequent history is as detailed below.    PAST MEDICAL HISTORY: Past Medical History:  Diagnosis Date   Asthma    Cancer ([place])    Family history of breast cancer 07/08/2020   Family history of ovarian cancer 07/08/2020   Genital herpes    History of radiation therapy 11/30/2020-12/29/2020   right breast   Dr Gery Pray    PAST SURGICAL HISTORY: Past Surgical History:  Procedure Laterality Date   BREAST LUMPECTOMY WITH RADIOACTIVE SEED AND SENTINEL LYMPH NODE BIOPSY Right 07/28/2020   Procedure: RIGHT BREAST LUMPECTOMY X 3  WITH RADIOACTIVE SEED AND SENTINEL LYMPH NODE BIOPSY;  Surgeon: Jovita Kussmaul, MD;  Location: [place];  Service: General;  Laterality: Right;  PEC BLOCK   FOOT SURGERY Right    MOUTH SURGERY Right     FAMILY HISTORY: Family History  Problem Relation Age of Onset   Ovarian cancer Sister 48   CVA Other    Breast cancer Other        maternal grandmother's sister, dx 68s, d. 35s   Colon cancer Neg Hx    Colon polyps Neg Hx    Esophageal cancer Neg Hx    Rectal cancer Neg Hx    Stomach cancer Neg Hx    Her father is 45, and her mother 75 as of 06/2020. [name] [name] has 3 brothers and 4 sisters. She reports ovarian cancer in her sister at age 60 (sister is doing well now) and breast cancer in a maternal great-aunt (post-menopausal).   GYNECOLOGIC HISTORY:  Last menstrual period August 2021  Menarche: 50 years old Age at first live birth: 50 years old [place] P 1 LMP periods are irregular Contraceptive: Patient's husband is status post vasectomy HRT n/a  Hysterectomy? no BSO? no   SOCIAL HISTORY: (updated 06/2020)  [name] [name] work as a Dentist. Husband [name] [name] "[name] [name]" is a Airline pilot. She lives at home with [name] [name]. Daughter [name] [name], age 17, is a Education administrator here in [place]. [name] [name] attends [employer].    ADVANCED DIRECTIVES: In the absence of any documentation to the contrary, the  patient's spouse is their HCPOA.    HEALTH MAINTENANCE: Social History   Tobacco Use   Smoking status: Never   Smokeless tobacco: Never  Vaping Use   Vaping Use: Never used  Substance Use Topics   Alcohol use: Yes    Comment: social   Drug use: No     Colonoscopy: 08/2021 (Dr. Silverio Decamp), repeat 2029  PAP: 01/2020  Bone density: 03/2021, -1.5   Allergies  Allergen Reactions   Keflex [Cephalexin] Itching   Shellfish Allergy     Throat and ear itching     Current Outpatient Medications  Medication Sig Dispense Refill   amoxicillin (AMOXIL) 875 MG tablet Take 1 tablet (875 mg total) by mouth 2 (two) times daily. 14 tablet 0   cholecalciferol (VITAMIN D3) 25 MCG (1000 UNIT) tablet Take 1 tablet (1,000 Units total) by mouth daily. (Patient not taking: No sig reported)     ibuprofen (ADVIL) 200 MG tablet Take 200-400 mg by mouth every 6 (six) hours as needed for headache or moderate pain.      oxybutynin (DITROPAN-XL) 10 MG 24 hr tablet Take 10 mg by mouth daily.     tamoxifen (NOLVADEX) 20 MG tablet TAKE 1 TABLET BY  MOUTH EVERY DAY 30 tablet PRN   Tetrahydrozoline HCl (VISINE OP) Place 1 drop into both eyes daily as needed (redness).      No current facility-administered medications for this visit.    OBJECTIVE:   Vitals:   10/20/21 1321  BP: 117/77  Pulse: 70  Resp: 18  Temp: 97.7 F (36.5 C)  SpO2: 100%     Body mass index is 27.06 kg/m.   Wt Readings from Last 3 Encounters:  10/20/21 143 lb 3.2 oz (65 kg)  09/26/21 141 lb 4.8 oz (64.1 kg)  08/28/21 138 lb (62.6 kg)     ECOG FS:1 - Symptomatic but completely ambulatory  GENERAL: Patient is a well appearing female in no acute distress HEENT:  Sclerae anicteric.  Oropharynx clear and moist. No ulcerations or evidence of oropharyngeal candidiasis. Neck is supple.  NODES:  No cervical, supraclavicular, or axillary lymphadenopathy palpated.  BREAST EXAM:  Deferred. LUNGS:  Clear to auscultation bilaterally.  No  wheezes or rhonchi. HEART:  Regular rate and rhythm. No murmur appreciated. ABDOMEN:  Soft, nontender.  Positive, normoactive bowel sounds. No organomegaly palpated. MSK:  No focal spinal tenderness to palpation. Full range of motion bilaterally in the upper extremities. EXTREMITIES:  No peripheral edema.   SKIN:  Clear with no obvious rashes or skin changes. No nail dyscrasia. NEURO:  Nonfocal. Well oriented.  Appropriate affect.    LAB RESULTS:  CMP     Component Value Date/Time   NA 137 10/18/2021 0849   K 4.0 10/18/2021 0849   CL 107 10/18/2021 0849   CO2 27 10/18/2021 0849   GLUCOSE 98 10/18/2021 0849   BUN 12 10/18/2021 0849   CREATININE 0.91 10/18/2021 0849   CALCIUM 8.9 10/18/2021 0849   PROT 6.7 10/18/2021 0849   ALBUMIN 3.9 10/18/2021 0849   AST 113 (H) 10/18/2021 0849   ALT 52 (H) 10/18/2021 0849   ALKPHOS 69 10/18/2021 0849   BILITOT 0.6 10/18/2021 0849   GFRNONAA >60 10/18/2021 0849   GFRAA >60 08/03/2020 1536    No results found for: TOTALPROTELP, ALBUMINELP, A1GS, A2GS, BETS, BETA2SER, GAMS, MSPIKE, SPEI  Lab Results  Component Value Date   WBC 3.2 (L) 10/18/2021   NEUTROABS 1.2 (L) 10/18/2021   HGB 12.3 10/18/2021   HCT 35.5 (L) 10/18/2021   MCV 88.8 10/18/2021   PLT 259 10/18/2021    No results found for: LABCA2  No components found for: ASTMHD622  No results for input(s): INR in the last 168 hours.  No results found for: LABCA2  No results found for: WLN989  No results found for: QJJ941  No results found for: DEY814  No results found for: CA2729  No components found for: HGQUANT  No results found for: CEA1 / No results found for: CEA1   No results found for: AFPTUMOR  No results found for: CHROMOGRNA  No results found for: KPAFRELGTCHN, LAMBDASER, KAPLAMBRATIO (kappa/lambda light chains)  No results found for: HGBA, HGBA2QUANT, HGBFQUANT, HGBSQUAN (Hemoglobinopathy evaluation)   No results found for: LDH  No results found  for: IRON, TIBC, IRONPCTSAT (Iron and TIBC)  No results found for: FERRITIN  Urinalysis    Component Value Date/Time   COLORURINE YELLOW 10/18/2021 [place] 10/18/2021 0849   LABSPEC 1.015 10/18/2021 [place] 5.0 10/18/2021 [place] 10/18/2021 [place] 10/18/2021 [place] 10/18/2021 [place] 10/18/2021 [place] NEGATIVE 10/18/2021  0849   NITRITE NEGATIVE 10/18/2021 [place] 10/18/2021 0849    STUDIES:     ELIGIBLE FOR AVAILABLE RESEARCH PROTOCOL: AET  ASSESSMENT: 50 y.o. [place] woman status post right breast biopsy x2 on 06/27/2020 for an mT1c N0, stage IA invasive ductal carcinoma, grade 1 or 2, estrogen and progesterone receptor positive, HER-2 not amplified, with an MIB-1 of 2%.  (a) an equivocal right axillary lymph node was biopsied 06/27/2020 and was benign  (b) chest CT scan 07/08/2020 showed no evidence of metastatic disease  (1) genetics testing recommended on 07/07/2020 but declined by patient  (2)  S/p right lumpectomy 07/28/2020 for an  mpT1c pN0, stage IA invasive ductal carcinoma, grade 2, with negative margins   (a) a total of 4 right axillary lymph nodes removed, all clear  (3) Oncotype score of 28 predicts a risk of this breast cancer recurring outside the breast within the next 9 years of 17% if the patient's only systemic therapy is antiestrogens for 5 years.  It also predicts a significant benefit from chemotherapy.  (4) adjuvant chemotherapy consisting of docetaxel and cyclophosphamide given every 21 days x 4 started 09/02/2020, completed 11/04/2020 [facility])  (5) adjuvant radiation completed 12/29/2020  (6) tamoxifen started 01/10/2021  (a) consider goserelin/anastrozole if menstruation resumes post chemo  (b) bone density 04/18/2021 shows a T score of -1.5   PLAN: Meelah is concerned about her occasional back pain and elevated  enzymes, along with her urinary frequency.  Her urine has grown out strep viridans.  I sent in amoxicillin for her to take this.  Because her liver enzymes are elevated we will get an ultrasound of the right upper quadrant to look at her liver and gallbladder to evaluate.  She is having some back pain.  We will get total spine x-rays.  She is also concerned with her urinary tract infection she may have a kidney stone.  After our discussion about this we will get plain films of the abdomen.  I reviewed with [name] [name] that we have to be very careful to be vigilant about evaluating her for recurrence but also keeping her risk as low as possible over radiation exposure and carefully evaluating risk versus benefit when ordering any imaging study.  She is in agreement with this.  We will start with the least invasive imaging modalities.  She is in agreement with this plan.  I reviewed with [name] [name] that I am not again imaging with CT scans and MRIs however it is nice to rule out simple things with plain film ultrasounds first.  I will get her results next week and I will call and discuss these with her.  Her follow-up will be based on those results.  [name] [name] verbalized understanding of the plan and is in agreement with it.  She knows to call for any questions or concerns prior to her next appointment with [place].  Total encounter time 30 minutes.*In face-to-face visit time, chart review, lab review, care coordination, and documentation of the encounter.  Wilber Bihari, NP 10/24/21 1:29 PM Medical Oncology and Hematology [facility] [place], [place] [address] Tel. [phone]    Fax. [phone]    *Total Encounter Time as defined by the Centers for Medicare and Medicaid Services includes, in addition to the face-to-face time of a patient visit (documented in the note above) non-face-to-face time: obtaining and reviewing outside history, ordering and reviewing medications, tests  or procedures, care coordination (communications with other health care  professionals or caregivers) and documentation in the medical record.

## 2021-10-25 ENCOUNTER — Encounter: Payer: Self-pay | Admitting: Oncology

## 2021-10-25 ENCOUNTER — Telehealth: Payer: Self-pay | Admitting: Adult Health

## 2021-10-25 ENCOUNTER — Encounter: Payer: Self-pay | Admitting: Adult Health

## 2021-10-25 NOTE — Telephone Encounter (Signed)
Scheduled per sch msg. Called and left msg. Mailed printout  

## 2021-10-26 ENCOUNTER — Encounter: Payer: Self-pay | Admitting: Oncology

## 2021-10-27 ENCOUNTER — Telehealth: Payer: Self-pay

## 2021-10-27 NOTE — Telephone Encounter (Signed)
Called pt to inform her we contacted Dr Ethlyn Gallery office to request an appt sooner than her currently scheduled 12/12/21 appt. Pt verbalized she is concerned this is cancerous and would like to speak with Dr Jana Hakim regarding [place] results. Advised pt I would leave a message for MD to see Monday. Pt verbalized thanks and understanding.

## 2021-10-30 ENCOUNTER — Encounter: Payer: Self-pay | Admitting: Oncology

## 2021-11-02 ENCOUNTER — Encounter: Payer: Self-pay | Admitting: Oncology

## 2021-11-08 ENCOUNTER — Other Ambulatory Visit: Payer: Self-pay | Admitting: Oncology

## 2021-11-08 ENCOUNTER — Other Ambulatory Visit: Payer: Self-pay | Admitting: General Surgery

## 2021-11-08 DIAGNOSIS — R42 Dizziness and giddiness: Secondary | ICD-10-CM

## 2021-11-08 NOTE — Progress Notes (Signed)
I called [name] [name] and discussed the situation with her.  I reassured her that we received the ultrasound really has no relationship to her breast cancer or to cancer in general.  She is very concerned that she had to push to get her cancer evaluated originally and I think that is a valid concern.  She has lab work scheduled for early January.  I suggested she continue her excellent diet, which is avoiding fats, until then.  If the labs are normal then perhaps we can start reintroducing fats slowly and see if she can tolerate that.

## 2021-11-21 ENCOUNTER — Inpatient Hospital Stay: Payer: 59 | Attending: Oncology

## 2021-11-21 ENCOUNTER — Other Ambulatory Visit: Payer: 59

## 2021-11-21 ENCOUNTER — Other Ambulatory Visit: Payer: Self-pay

## 2021-11-21 DIAGNOSIS — Z170 Estrogen receptor positive status [ER+]: Secondary | ICD-10-CM

## 2021-11-21 DIAGNOSIS — C50811 Malignant neoplasm of overlapping sites of right female breast: Secondary | ICD-10-CM | POA: Insufficient documentation

## 2021-11-21 LAB — CBC WITH DIFFERENTIAL/PLATELET
Abs Immature Granulocytes: 0.01 10*3/uL (ref 0.00–0.07)
Basophils Absolute: 0 10*3/uL (ref 0.0–0.1)
Basophils Relative: 1 %
Eosinophils Absolute: 0 10*3/uL (ref 0.0–0.5)
Eosinophils Relative: 1 %
HCT: 36 % (ref 36.0–46.0)
Hemoglobin: 12.3 g/dL (ref 12.0–15.0)
Immature Granulocytes: 0 %
Lymphocytes Relative: 39 %
Lymphs Abs: 1.6 10*3/uL (ref 0.7–4.0)
MCH: 30.8 pg (ref 26.0–34.0)
MCHC: 34.2 g/dL (ref 30.0–36.0)
MCV: 90.2 fL (ref 80.0–100.0)
Monocytes Absolute: 0.4 10*3/uL (ref 0.1–1.0)
Monocytes Relative: 10 %
Neutro Abs: 2 10*3/uL (ref 1.7–7.7)
Neutrophils Relative %: 49 %
Platelets: 268 10*3/uL (ref 150–400)
RBC: 3.99 MIL/uL (ref 3.87–5.11)
RDW: 11.2 % — ABNORMAL LOW (ref 11.5–15.5)
WBC: 4 10*3/uL (ref 4.0–10.5)
nRBC: 0 % (ref 0.0–0.2)

## 2021-11-21 LAB — COMPREHENSIVE METABOLIC PANEL
ALT: 19 U/L (ref 0–44)
AST: 30 U/L (ref 15–41)
Albumin: 4 g/dL (ref 3.5–5.0)
Alkaline Phosphatase: 65 U/L (ref 38–126)
Anion gap: 5 (ref 5–15)
BUN: 14 mg/dL (ref 6–20)
CO2: 27 mmol/L (ref 22–32)
Calcium: 9.1 mg/dL (ref 8.9–10.3)
Chloride: 109 mmol/L (ref 98–111)
Creatinine, Ser: 0.94 mg/dL (ref 0.44–1.00)
GFR, Estimated: >60 mL/min (ref >60)
Glucose, Bld: 96 mg/dL (ref 70–99)
Potassium: 4.4 mmol/L (ref 3.5–5.1)
Sodium: 141 mmol/L (ref 135–145)
Total Bilirubin: 0.8 mg/dL (ref 0.3–1.2)
Total Protein: 6.6 g/dL (ref 6.5–8.1)

## 2021-11-30 ENCOUNTER — Other Ambulatory Visit: Payer: Self-pay | Admitting: *Deleted

## 2021-11-30 DIAGNOSIS — C50811 Malignant neoplasm of overlapping sites of right female breast: Secondary | ICD-10-CM

## 2021-12-07 ENCOUNTER — Ambulatory Visit
Admission: RE | Admit: 2021-12-07 | Discharge: 2021-12-07 | Disposition: A | Discharge status: Home / Self Care | Payer: 59 | Source: Ambulatory Visit | Attending: General Surgery | Admitting: General Surgery

## 2021-12-07 DIAGNOSIS — R42 Dizziness and giddiness: Secondary | ICD-10-CM

## 2021-12-10 ENCOUNTER — Encounter: Payer: Self-pay | Admitting: Hematology and Oncology

## 2021-12-11 ENCOUNTER — Other Ambulatory Visit: Payer: Self-pay | Admitting: *Deleted

## 2021-12-14 ENCOUNTER — Other Ambulatory Visit: Payer: Self-pay | Admitting: General Surgery

## 2021-12-14 ENCOUNTER — Encounter (HOSPITAL_COMMUNITY): Payer: Self-pay

## 2021-12-14 DIAGNOSIS — R1031 Right lower quadrant pain: Secondary | ICD-10-CM

## 2022-01-10 ENCOUNTER — Ambulatory Visit
Admission: RE | Admit: 2022-01-10 | Discharge: 2022-01-10 | Disposition: A | Discharge status: Home / Self Care | Payer: 59 | Source: Ambulatory Visit | Attending: General Surgery | Admitting: General Surgery

## 2022-01-10 ENCOUNTER — Other Ambulatory Visit: Payer: Self-pay

## 2022-01-10 DIAGNOSIS — R1032 Left lower quadrant pain: Secondary | ICD-10-CM

## 2022-01-10 DIAGNOSIS — R1031 Right lower quadrant pain: Secondary | ICD-10-CM

## 2022-01-10 MED ORDER — IOPAMIDOL (ISOVUE-300) INJECTION 61%
100.0000 mL | Freq: Once | INTRAVENOUS | Status: AC | PRN
  Administered 2022-01-10: 100 mL via INTRAVENOUS

## 2022-01-11 IMAGING — DX DG LUMBAR SPINE 2-3V
3 series · 3 of 3 positions shown · non-contrast
Comparison: None.

CLINICAL DATA: Low back pain, history of breast carcinoma, initial
encounter

EXAM:
LUMBAR SPINE - 3 VIEW

[l-spine ap]
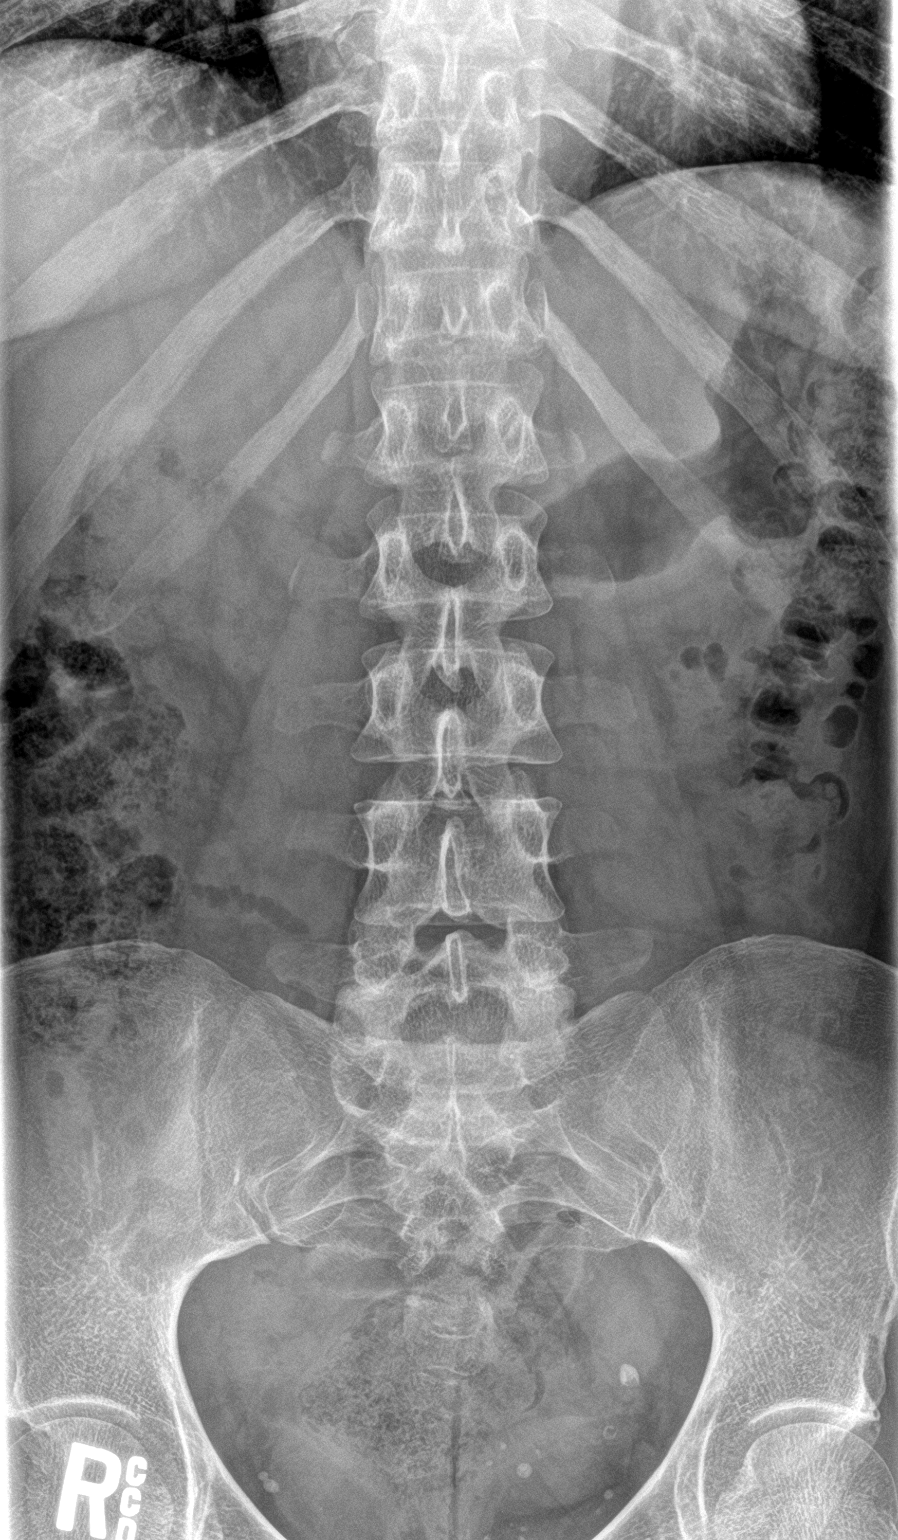

[l-spine lat]
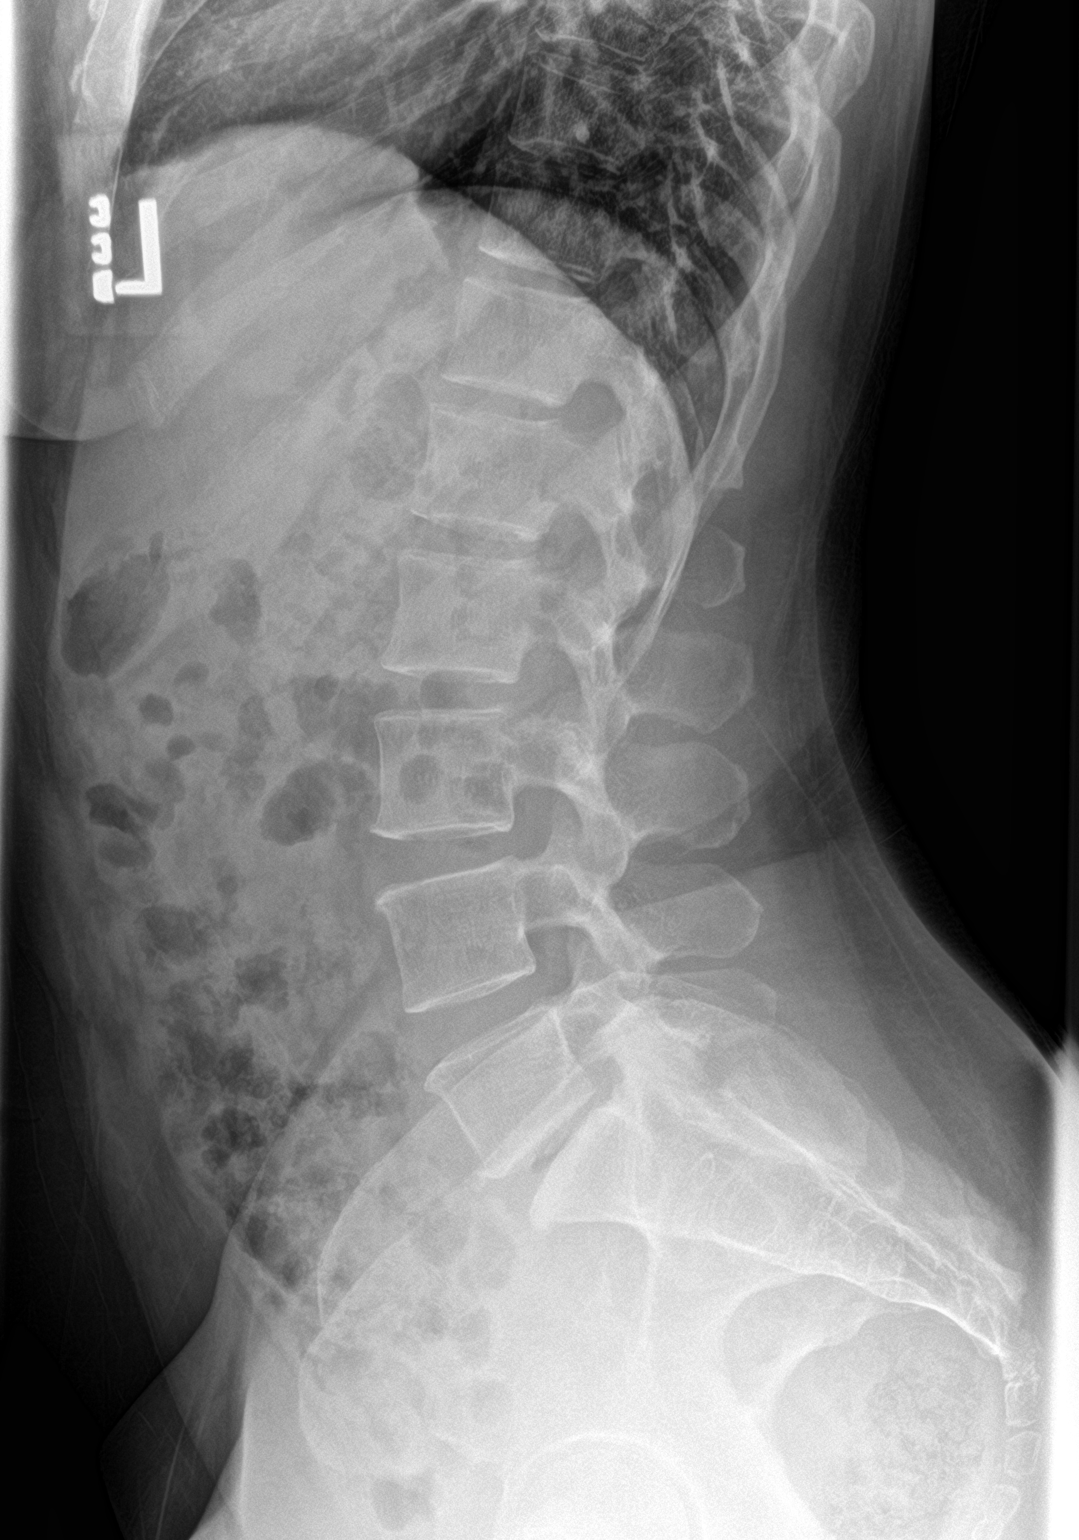

[l-spine spot]
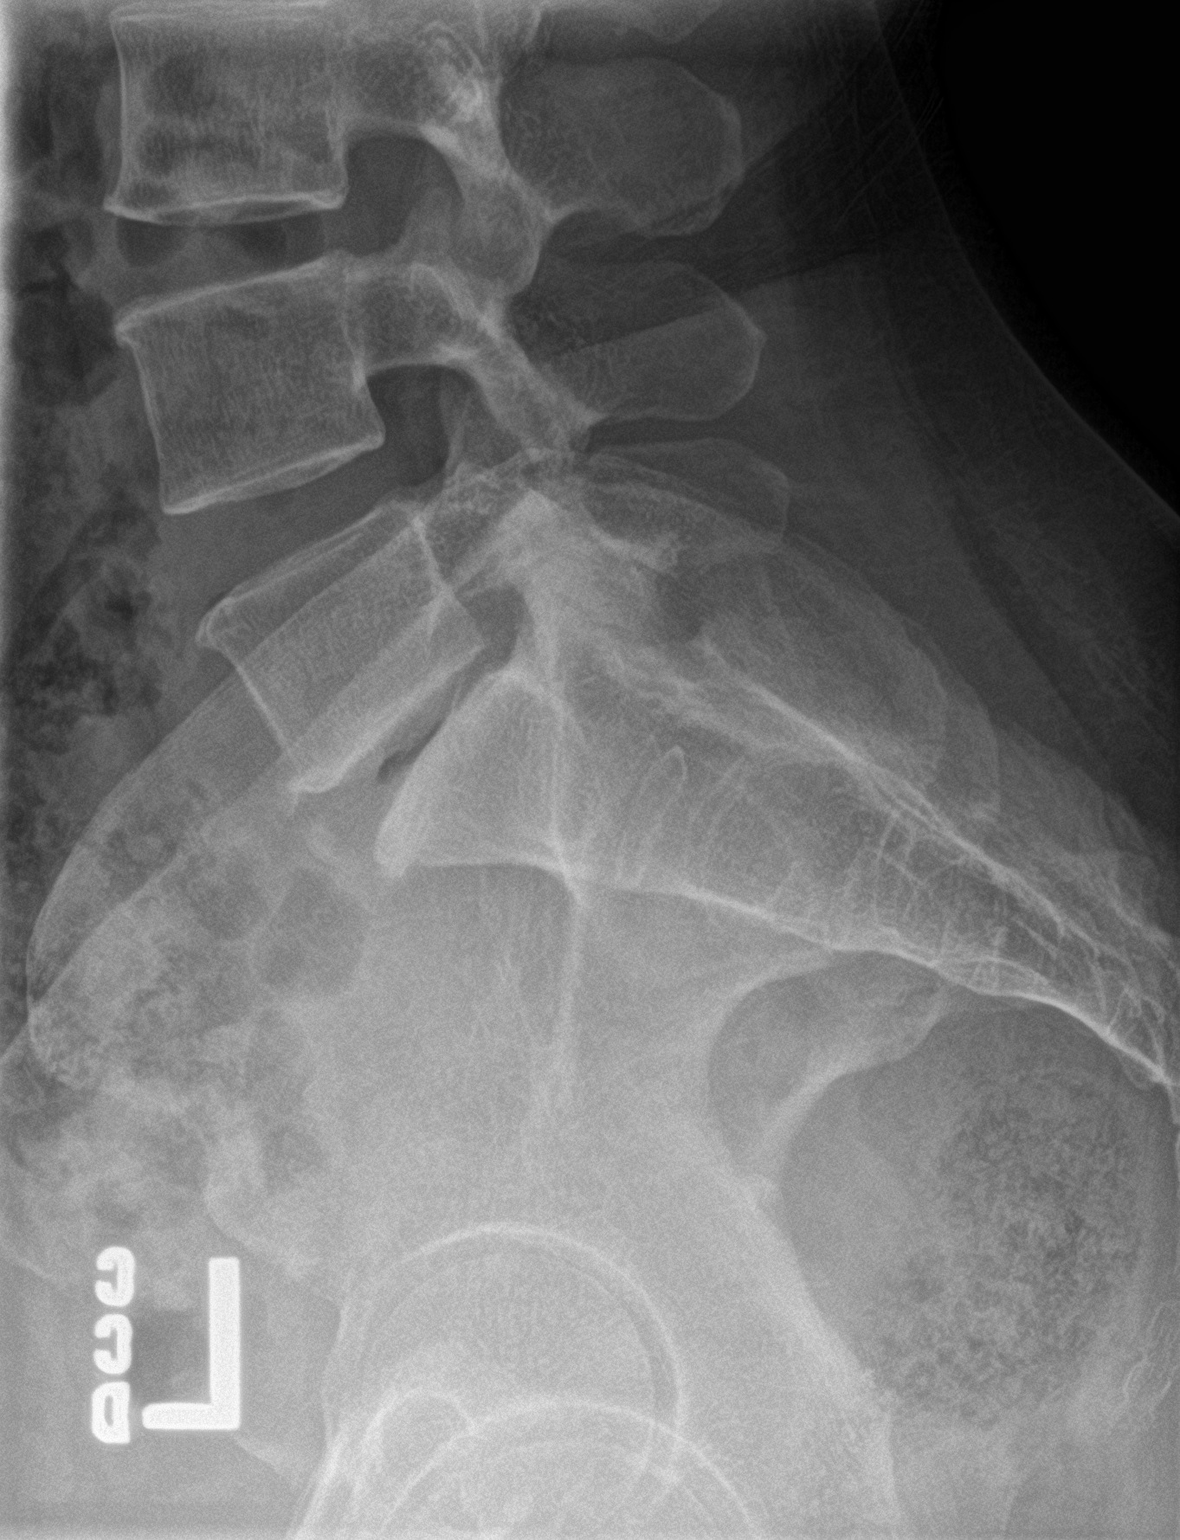

[3 of 3 positions shown; findings below may reference images not displayed]

FINDINGS: Five lumbar type vertebral bodies are well visualized. Vertebral
body height is well maintained. No lytic or sclerotic lesion is
seen. Mild disc space narrowing is noted at the L5-S1.
IMPRESSION: Mild degenerative change without acute abnormality.

## 2022-01-11 IMAGING — US US ABDOMEN LIMITED
1 series · 14 of 25 positions shown · non-contrast
Comparison: None.

CLINICAL DATA: Elevated LFTs

EXAM:
ULTRASOUND ABDOMEN LIMITED RIGHT UPPER QUADRANT

[Series 1: us abdomen limited ruq (liver/gb) · 14 of 58 slices shown]
[im 1/58]
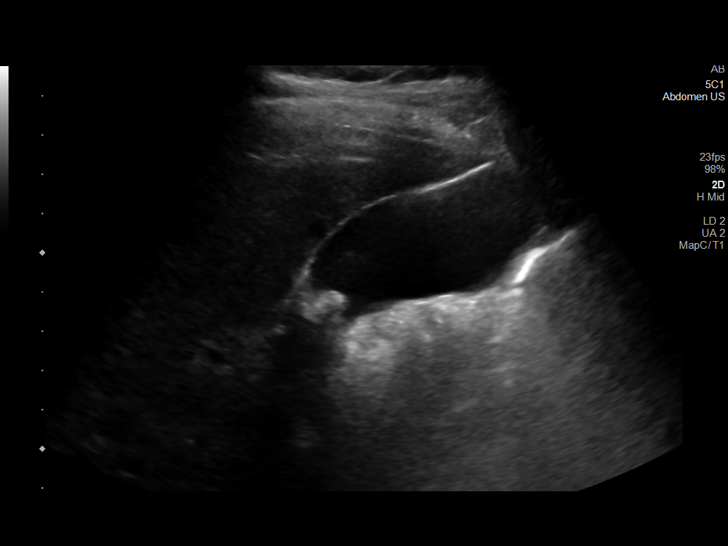
[im 5/58]
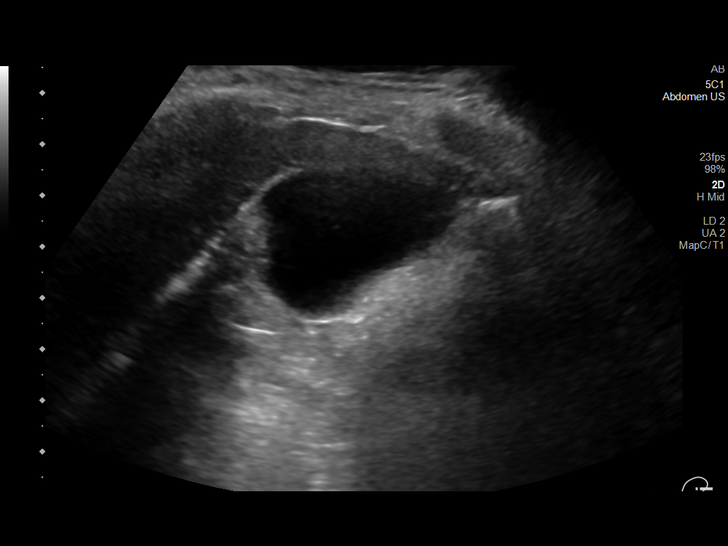
[im 10/58]
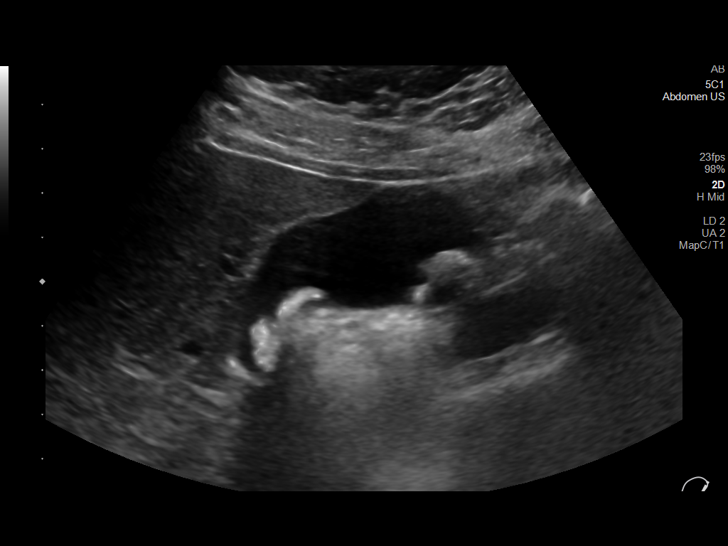
[im 15/58]
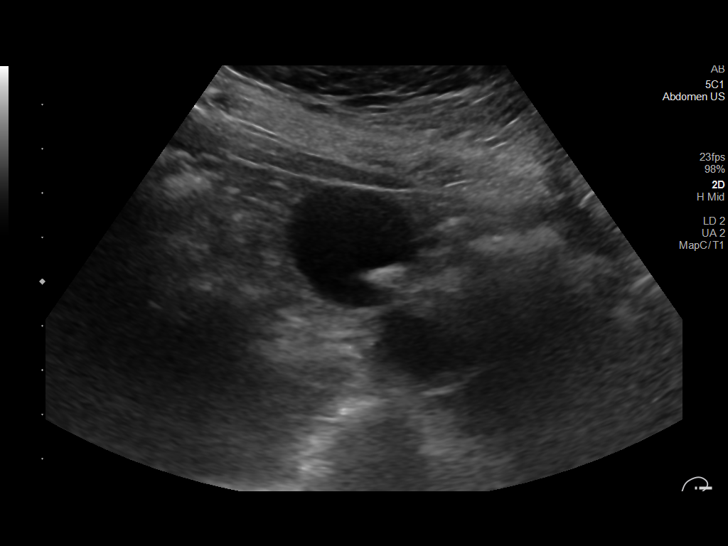
[im 20/58]
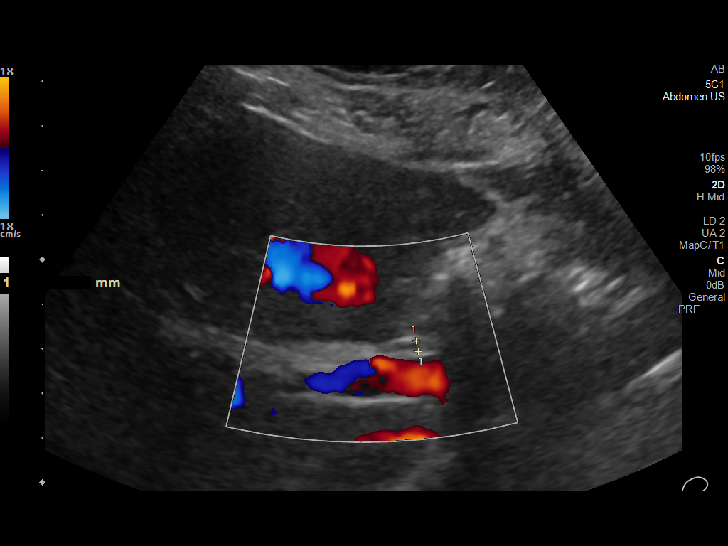
[im 22/58]
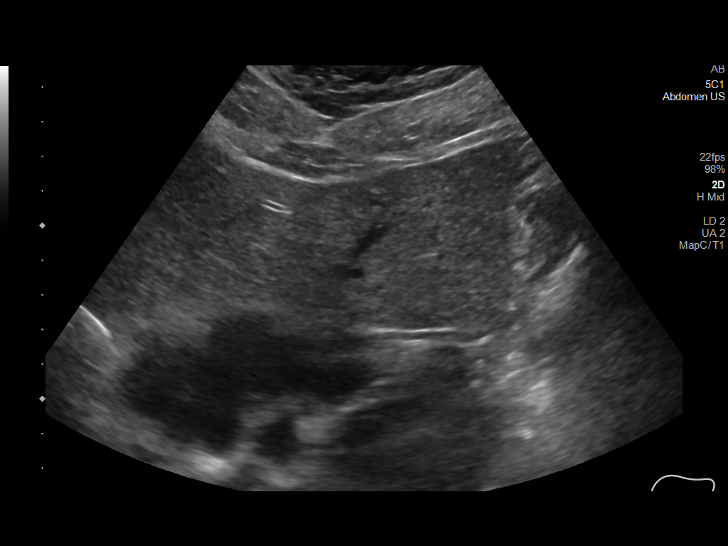
[im 27/58]
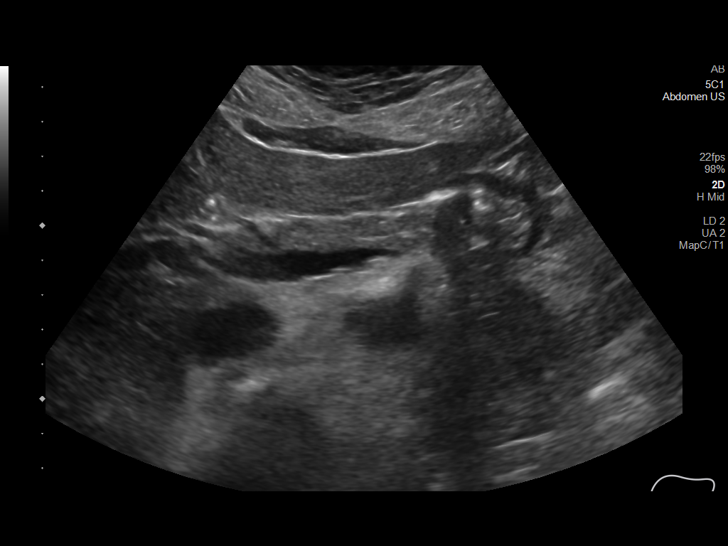
[im 31/58]
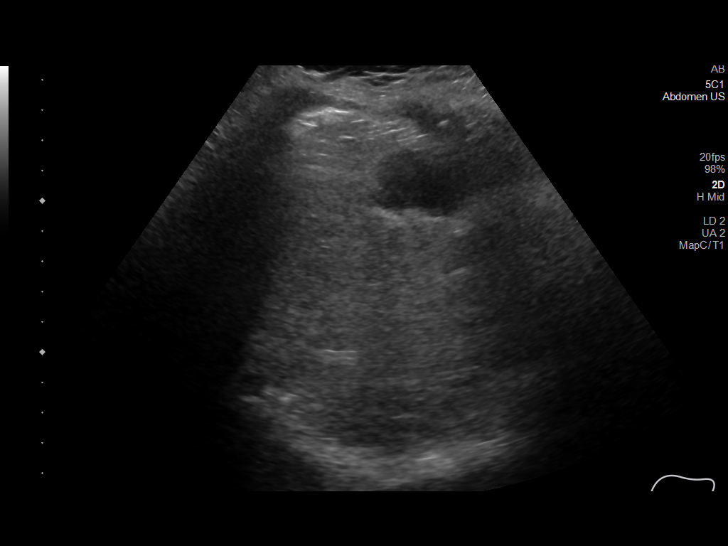
[im 36/58]
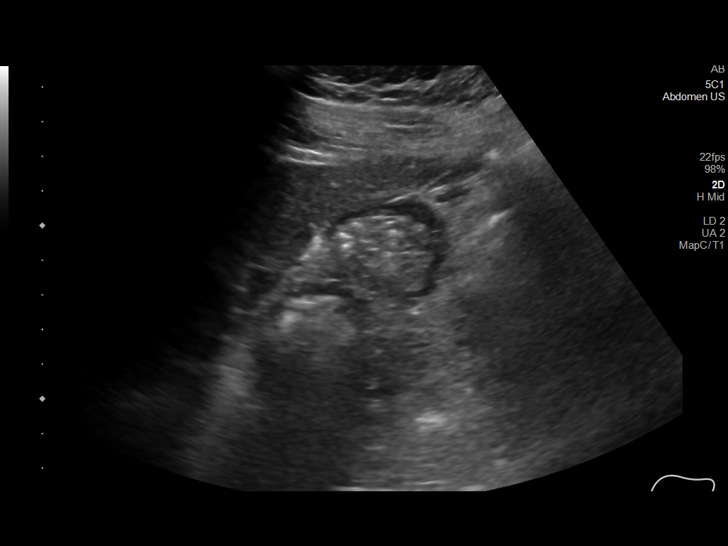
[im 39/58]
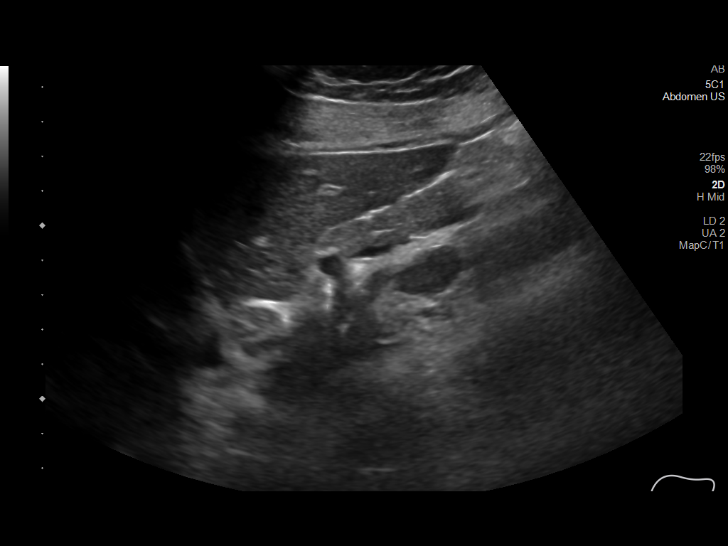
[im 43/58]
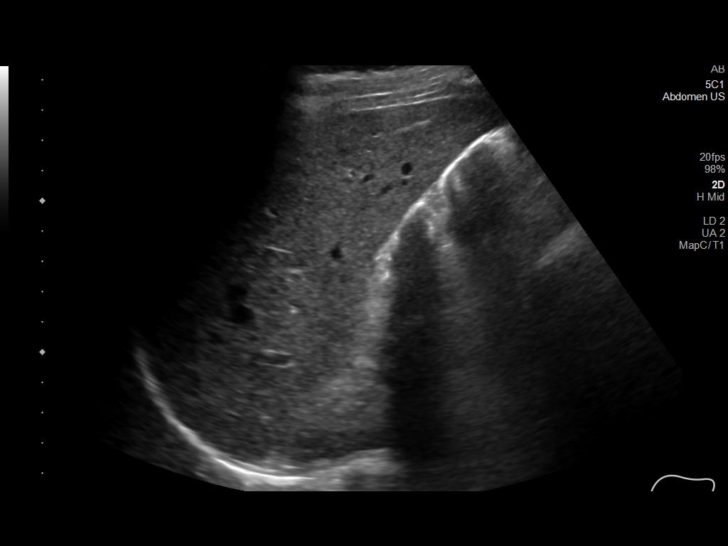
[im 48/58]
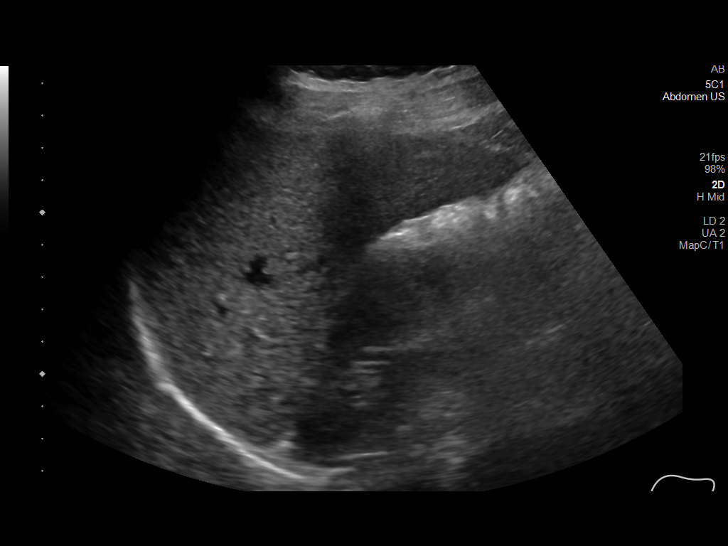
[im 53/58]
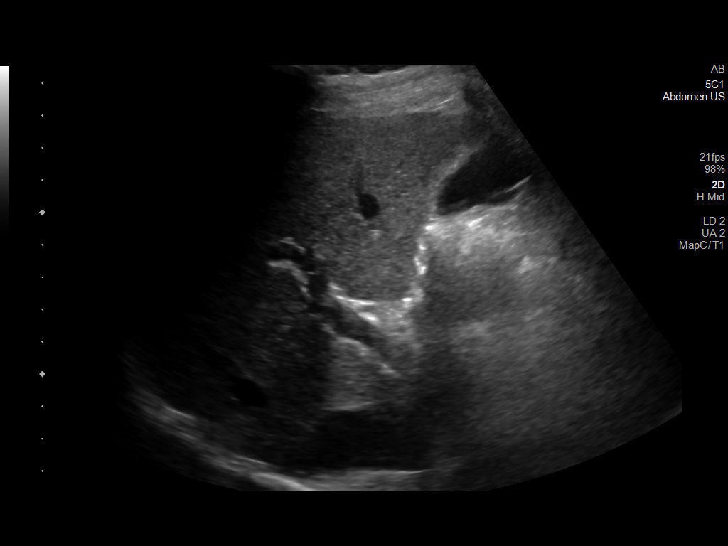
[im 58/58]
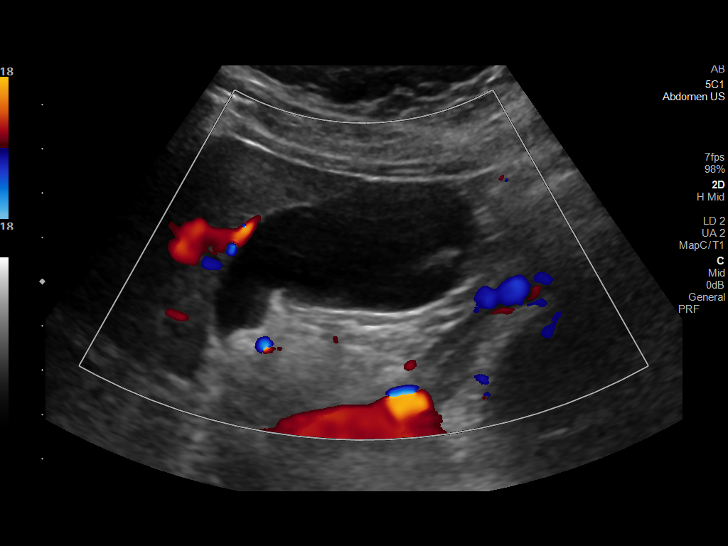

[14 of 25 positions shown; findings below may reference images not displayed]

FINDINGS: Gallbladder:

Gallbladder is well distended with multiple gallstones within. No
pericholecystic fluid is noted. No wall thickening is seen. Negative
sonographic Murphy's sign is elicited.

Common bile duct:

Diameter: 2.9 mm.

Liver:

No focal lesion identified. Within normal limits in parenchymal
echogenicity. Portal vein is patent on color Doppler imaging with
normal direction of blood flow towards the liver.

Other: None.
IMPRESSION: Cholelithiasis without complicating factors.

## 2022-01-11 IMAGING — DX DG CERVICAL SPINE 2 OR 3 VIEWS
4 series · 4 of 4 positions shown · non-contrast
Comparison: None.

CLINICAL DATA: Neck pain, history of breast carcinoma, initial
encounter

EXAM:
CERVICAL SPINE - 3 VIEW

[c-spine lat]
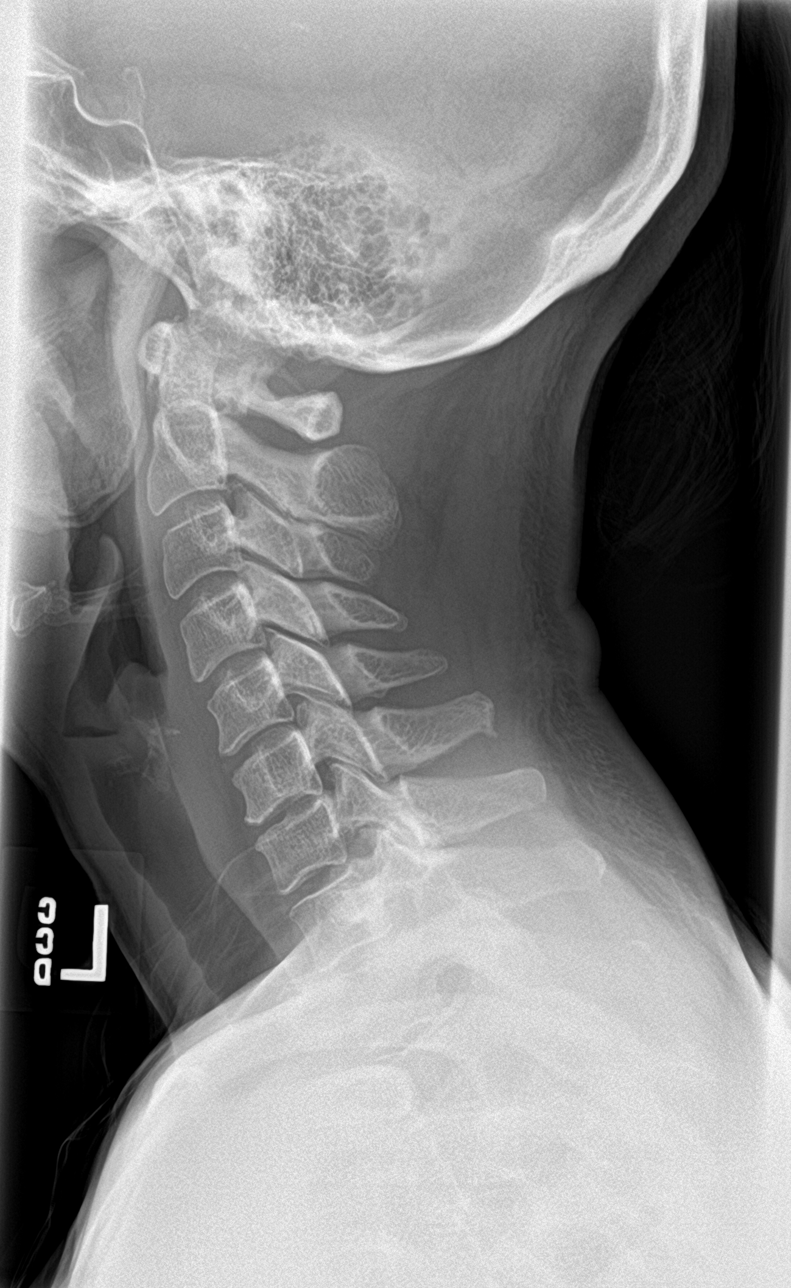

[c-spine ap]
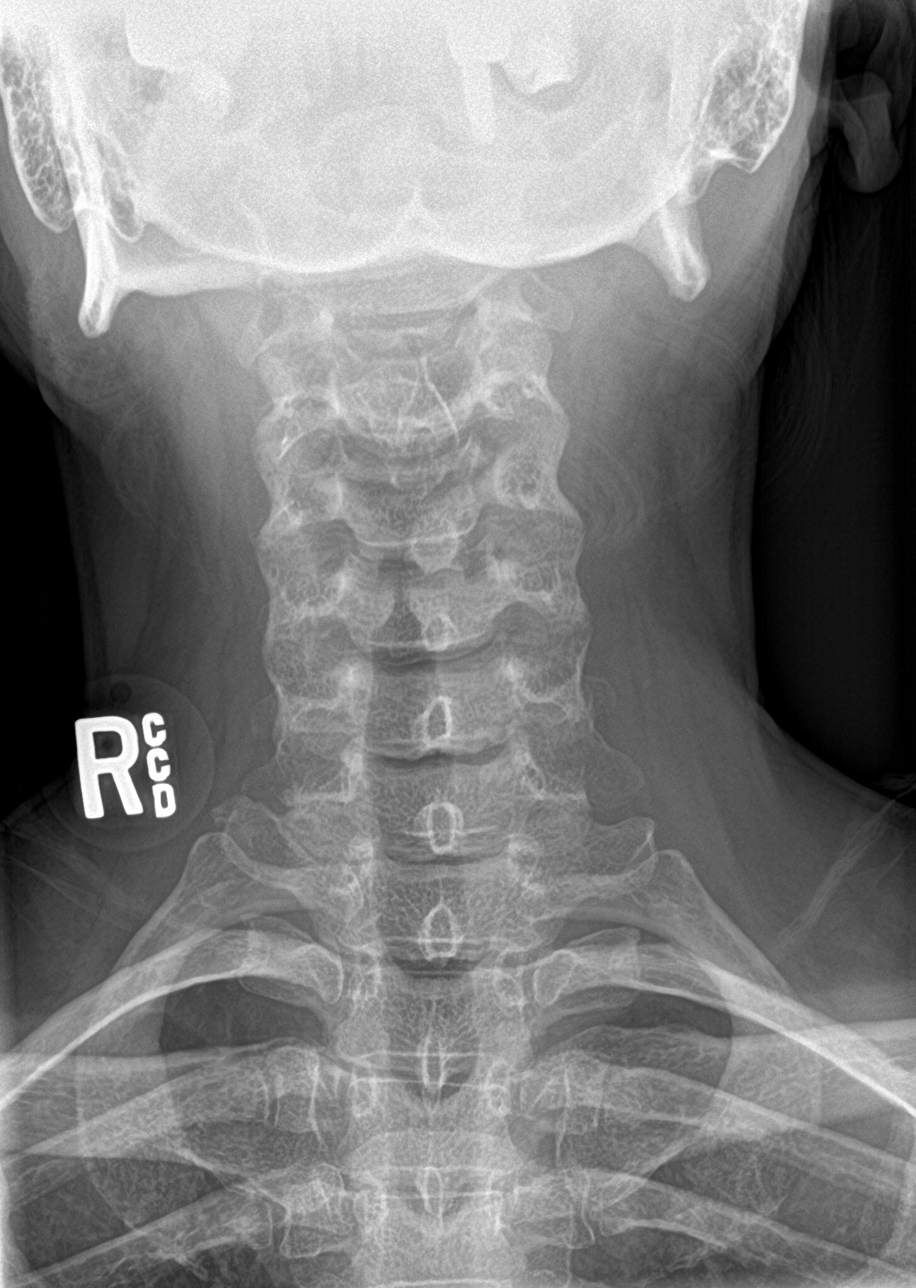

[c-spine open mouth]
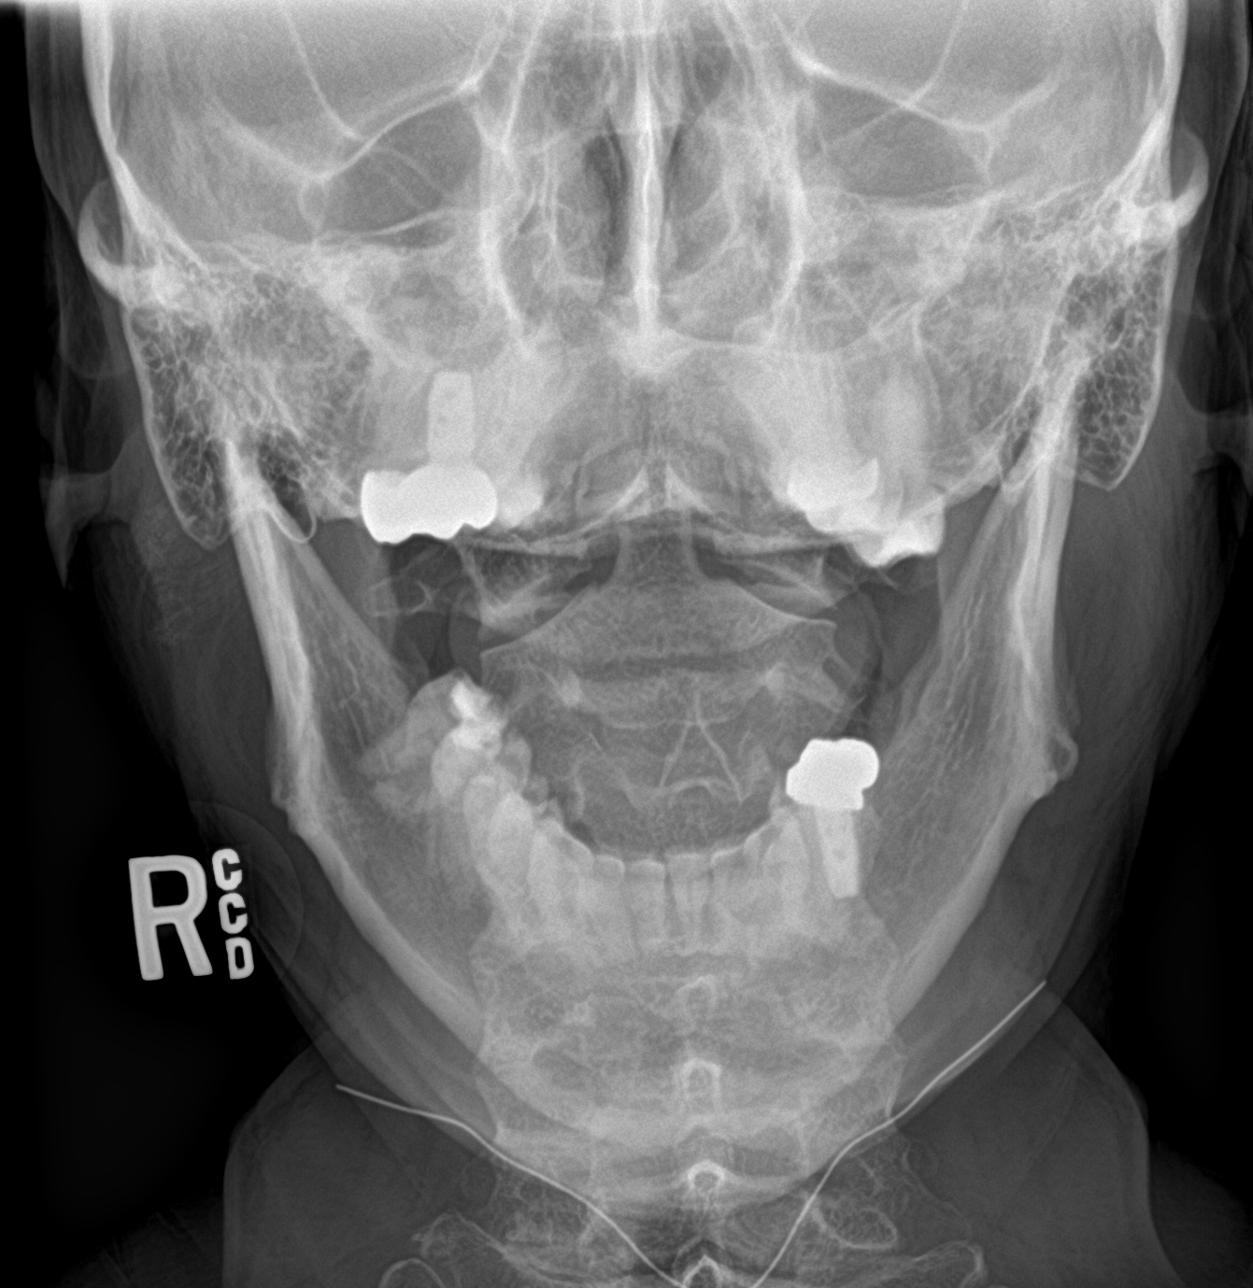

[c-spine swimmers]
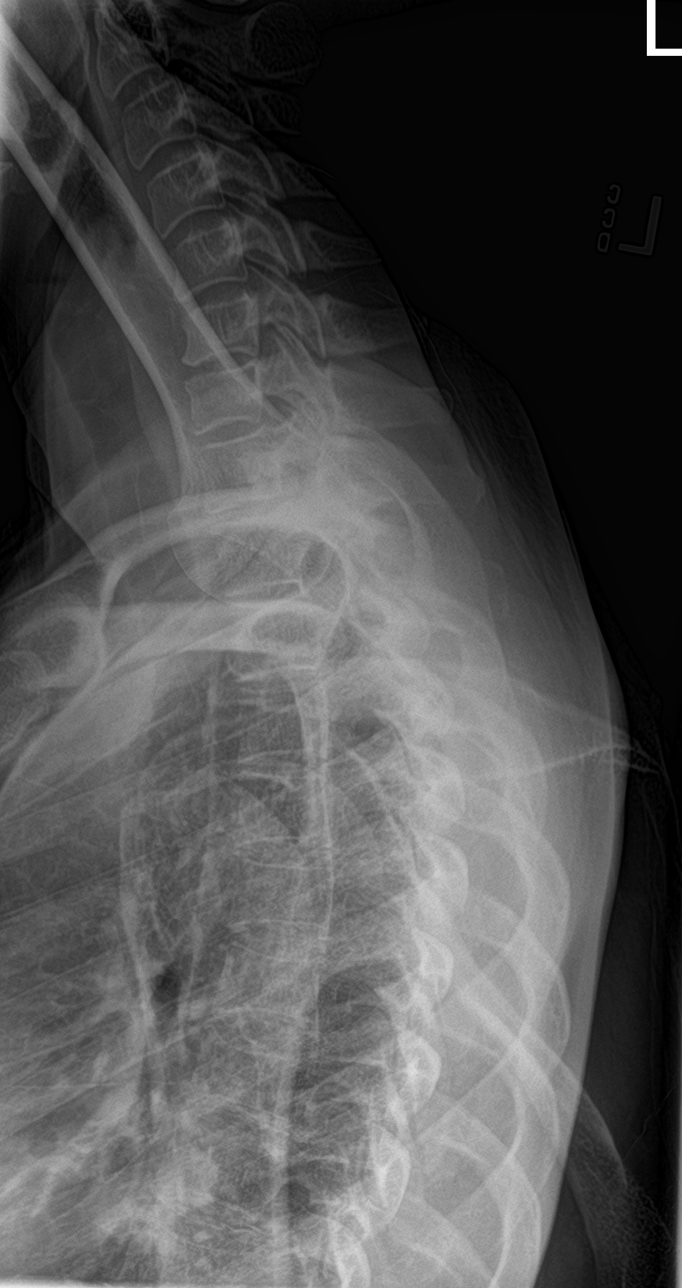

[4 of 4 positions shown; findings below may reference images not displayed]

FINDINGS: Seven cervical segments are well visualized. Very minimal
osteophytic changes are noted at C5-6 and C6-7. No acute fracture or
acute facet abnormality is noted. The odontoid is within normal
limits. No soft tissue abnormality is seen.
IMPRESSION: Mild degenerative change without acute abnormality.

## 2022-01-11 IMAGING — DX DG THORACIC SPINE 2V
2 series · 2 of 2 positions shown · non-contrast
Comparison: None.

CLINICAL DATA: Midthoracic chest pain, history of breast carcinoma,
initial encounter

EXAM:
THORACIC SPINE 2 VIEWS

[t-spine ap]
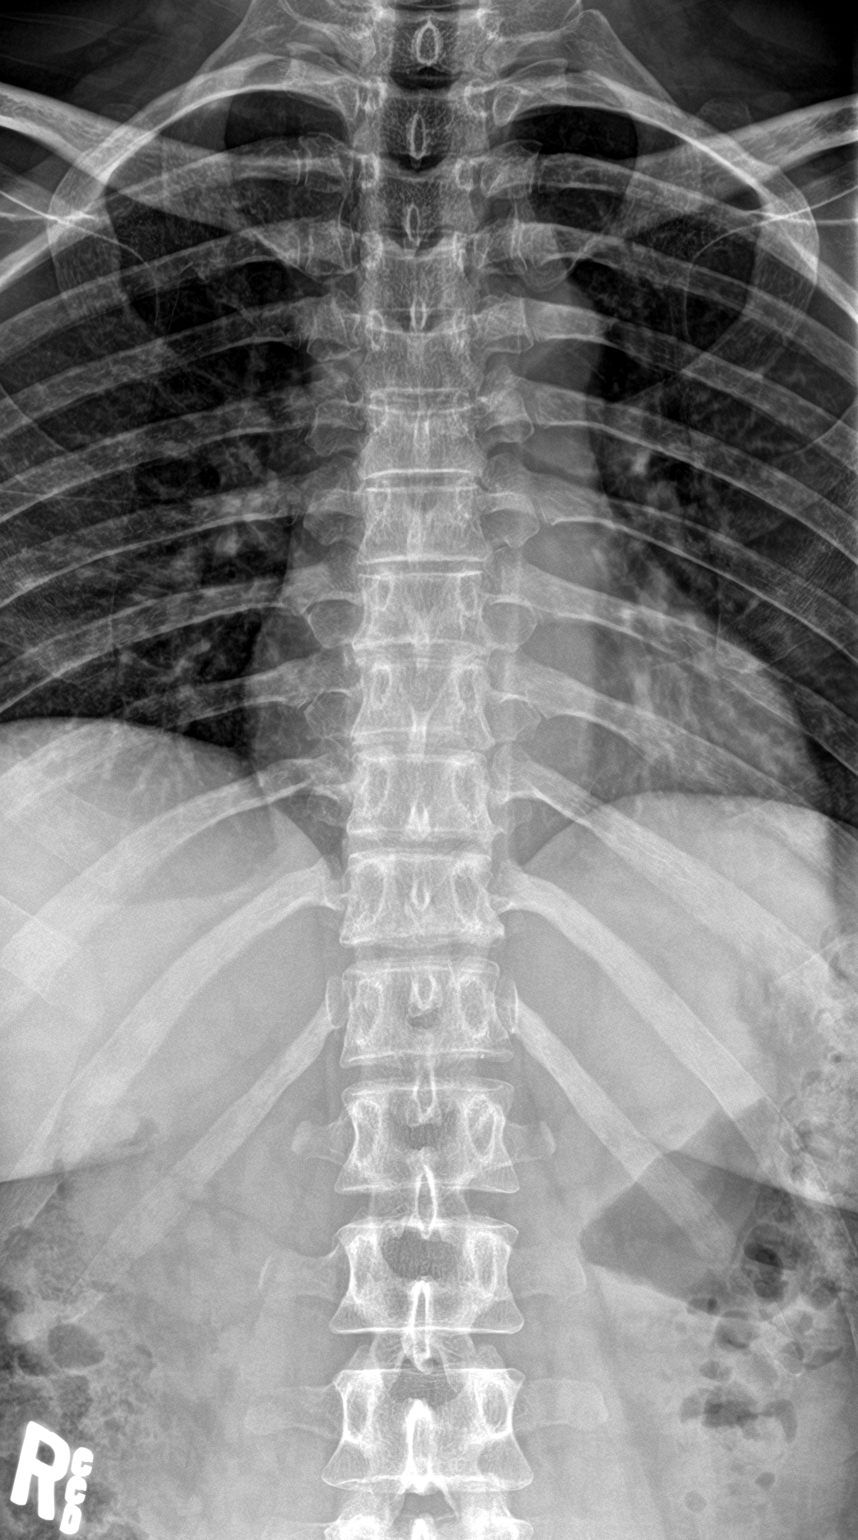

[t-spine lat]
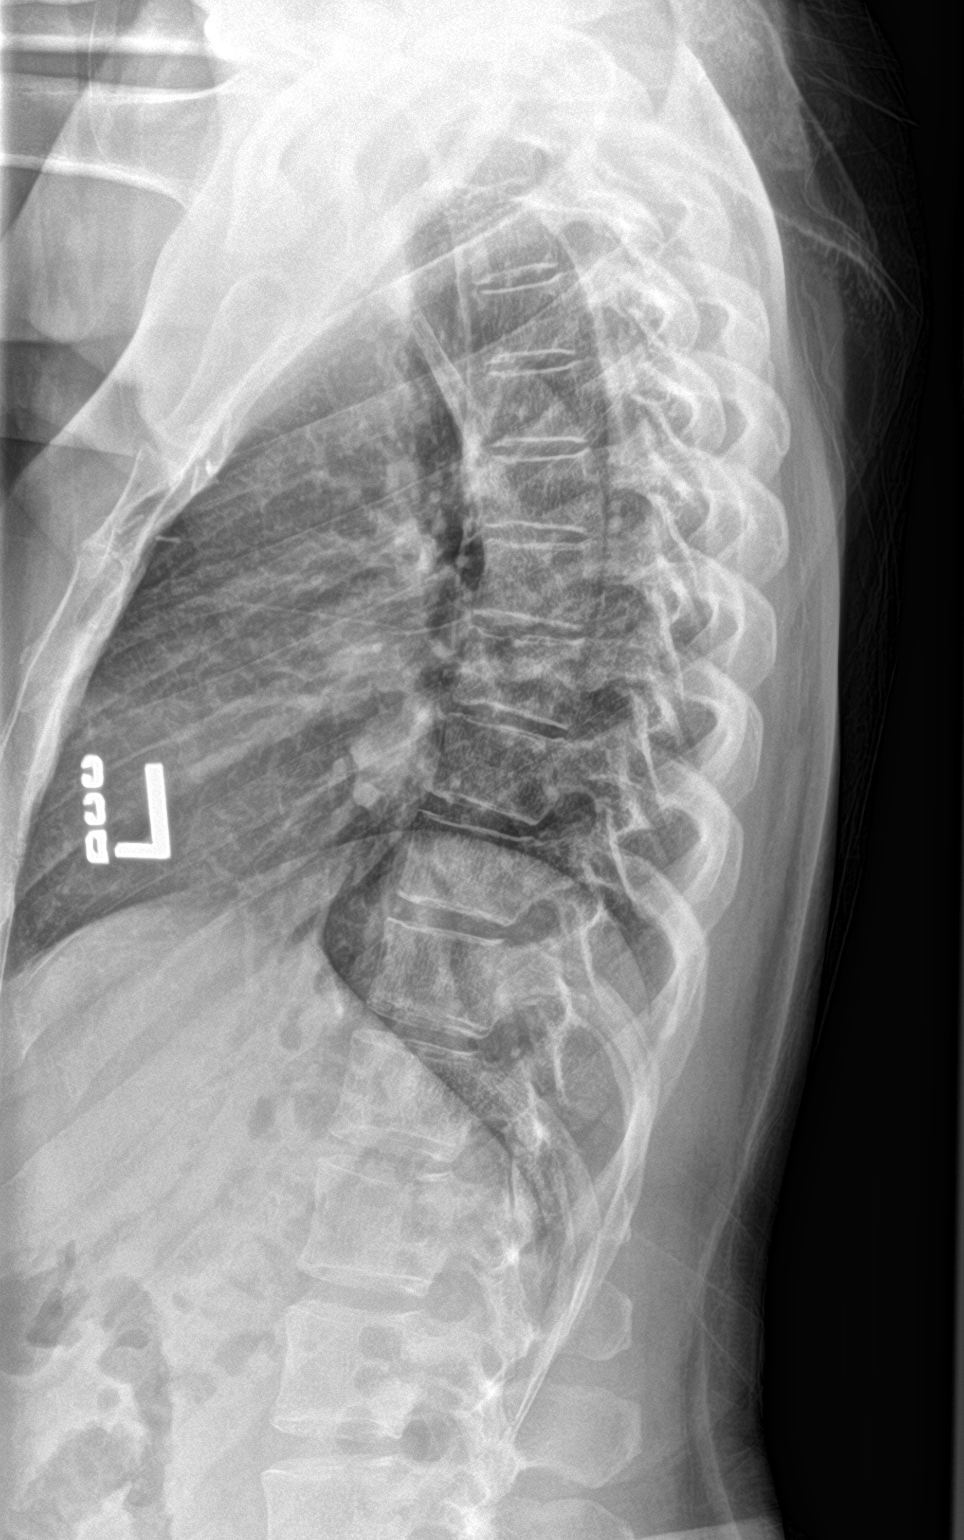

[2 of 2 positions shown; findings below may reference images not displayed]

FINDINGS: Vertebral body height is well maintained. Pedicles are within normal
limits. No paraspinal mass is seen. Visualize ribcage is within
normal limits.
IMPRESSION: No acute abnormality noted.

## 2022-02-10 ENCOUNTER — Encounter: Payer: Self-pay | Admitting: Hematology and Oncology

## 2022-02-14 ENCOUNTER — Telehealth: Payer: Self-pay | Admitting: Internal Medicine

## 2022-02-14 ENCOUNTER — Encounter: Payer: Self-pay | Admitting: Adult Health

## 2022-02-14 ENCOUNTER — Inpatient Hospital Stay: Payer: 59 | Attending: Oncology | Admitting: Adult Health

## 2022-02-14 ENCOUNTER — Other Ambulatory Visit: Payer: Self-pay

## 2022-02-14 VITALS — BP 120/83 | HR 71 | Temp 97.7°F | Resp 16 | Ht 61.0 in | Wt 143.4 lb

## 2022-02-14 DIAGNOSIS — C50811 Malignant neoplasm of overlapping sites of right female breast: Secondary | ICD-10-CM | POA: Insufficient documentation

## 2022-02-14 DIAGNOSIS — Z170 Estrogen receptor positive status [ER+]: Secondary | ICD-10-CM | POA: Insufficient documentation

## 2022-02-14 DIAGNOSIS — Z79810 Long term (current) use of selective estrogen receptor modulators (SERMs): Secondary | ICD-10-CM | POA: Insufficient documentation

## 2022-02-14 NOTE — Telephone Encounter (Signed)
Scheduled appt per 3/29 referral. Pt is aware of appt date and time. Pt is aware to arrive 15 mins prior to appt time and to bring and updated insurance card. Pt is aware of appt location.   ?

## 2022-02-14 NOTE — Assessment & Plan Note (Signed)
[name] [name] is a 51 year old woman with history of stage Ia estrogen positive breast cancer status post right lumpectomy, adjuvant chemotherapy, adjuvant radiation, and continuing on tamoxifen. ? ?1.  Stage Ia breast cancer: She is doing well.  She has no clinical sign of recurrence.  We discussed her breast fullness and due to this and the initial negative mammogram despite palpable concern we will add ultrasound to her mammography when she has this completed at [facility] in May. ? ?2.  Numbness in hands she has an atypical presentation of this.  I recommended that she see our neuro oncologist Dr. Mickeal Skinner to talk about this further and for assistance in discerning whether this is orthopedic, neurologic, or oncologic. ? ?We will see [name] [name] back in June 2023 for labs and follow-up with Dr. Lindi Adie. ?

## 2022-02-14 NOTE — Progress Notes (Signed)
[facility] Follow up: ?  ? ?[name], [name], MD ?[address] ?[place] [address] ? ? ?DIAGNOSIS:  Cancer Staging  ?Malignant neoplasm of overlapping sites of right breast in female, estrogen receptor positive ([place]) ?Staging form: Breast, AJCC 8th Edition ?- Clinical stage from 07/06/2020: Stage IA (cT1, cN0, cM0, G2, ER+, PR+, HER2-) - Unsigned ?Stage prefix: Initial diagnosis ?Histologic grading system: 3 grade system ?Laterality: Right ?Staged by: Pathologist and managing physician ?Stage used in treatment planning: Yes ?National guidelines used in treatment planning: Yes ?Type of national guideline used in treatment planning: NCCN ?- Pathologic stage from 07/28/2020: Stage IA (pT1c, pN0, cM0, G2, ER+, PR+, HER2-, Oncotype DX score: 28) - Signed by Gardenia Phlegm, NP on 08/17/2020 ?Stage prefix: Initial diagnosis ?Multigene prognostic tests performed: Oncotype DX ?Recurrence score range: Greater than or equal to 11 ?Histologic grading system: 3 grade system ? ? ?SUMMARY OF ONCOLOGIC HISTORY: ?Oncology History  ?Malignant neoplasm of overlapping sites of right breast in female, estrogen receptor positive ([place])  ?07/01/2020 Initial Diagnosis  ? Malignant neoplasm of overlapping sites of right breast in female, estrogen receptor positive ([place]) ?  ?07/28/2020 Cancer Staging  ? Staging form: Breast, AJCC 8th Edition ?- Pathologic stage from 07/28/2020: Stage IA (pT1c, pN0, cM0, G2, ER+, PR+, HER2-, Oncotype DX score: 28) ?  ?07/28/2020 Surgery  ? Right lumpectomy Marlou Starks) [phone]): multifocal IDC, 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 Margin were negative. 4 lymph nodes were negative. ?  ?08/10/2020 Oncotype testing  ? The Oncotype DX score was 28 predicting a risk of outside the breast recurrence over the next 9 years of 17% if the patient's only systemic therapy is tamoxifen for 5 years.  ?  ?09/02/2020 - 11/04/2020 Chemotherapy  ? adjuvant chemotherapy consisting of  docetaxel and cyclophosphamide given every 21 days x 4 started 09/02/2020, completed 11/04/2020 [facility]) ?  ?11/30/2020 - 12/29/2020 Radiation Therapy  ? The patient initially received a dose of 40.05 Gy in 15 fractions to the breast using whole-breast tangent fields. This was delivered using a 3-D conformal technique. The pt received a boost delivering an additional 10 Gy in 5 fractions using a electron boost with 75mV electrons. The total dose was 50.05 Gy. ?  ?12/2020 -  Anti-estrogen oral therapy  ? Tamoxifen ?  ? ? ?CURRENT THERAPY: Tamoxifen ? ?INTERVAL HISTORY: ?[name] Baskerville89y.o. female returns for evaluation of right breast soreness along with numbness and tingling.  She is concerned due to increased thickness and darkness of veins leading to the nipple which is noted in her cancer book as being a sign of recurrence.  She also is experiencing some breast heaviness.  She is very concerned about this because her breast cancer was initially missed by mammogram and she does not want that to happen again. ? ?She also has intermittent numbness in her hands.  She denies any neck or back pain but notes that throughout the day no specific pattern her hands will get numb.  At first it was just in the morning however this has progressed.  She has no focal weakness in her arms. ? ?Patient Active Problem List  ? Diagnosis Date Noted  ? Family history of ovarian cancer 07/08/2020  ? Family history of breast cancer 07/08/2020  ? Malignant neoplasm of overlapping sites of right breast in female, estrogen receptor positive ([place] 07/01/2020  ? Murmur 03/09/2014  ? Bradycardia 03/09/2014  ? Genital herpes   ? ? ?is  allergic to keflex [cephalexin] and shellfish allergy. ? ?MEDICAL HISTORY: ?Past Medical History:  ?Diagnosis Date  ? Asthma   ? Cancer [facility])   ? Family history of breast cancer 07/08/2020  ? Family history of ovarian cancer 07/08/2020  ? Genital herpes   ? History of radiation therapy 11/30/2020-12/29/2020  ?  right breast   Dr Gery Pray  ? ? ?SURGICAL HISTORY: ?Past Surgical History:  ?Procedure Laterality Date  ? BREAST LUMPECTOMY WITH RADIOACTIVE SEED AND SENTINEL LYMPH NODE BIOPSY Right 07/28/2020  ? Procedure: RIGHT BREAST LUMPECTOMY X 3  WITH RADIOACTIVE SEED AND SENTINEL LYMPH NODE BIOPSY;  Surgeon: Jovita Kussmaul, MD;  Location: [facility];  Service: General;  Laterality: Right;  PEC BLOCK  ? FOOT SURGERY Right   ? MOUTH SURGERY Right   ? ? ?SOCIAL HISTORY: ?Social History  ? ?Socioeconomic History  ? Marital status: Married  ?  Spouse name: Not on file  ? Number of children: Not on file  ? Years of education: Not on file  ? Highest education level: Not on file  ?Occupational History  ? Not on file  ?Tobacco Use  ? Smoking status: Never  ? Smokeless tobacco: Never  ?Vaping Use  ? Vaping Use: Never used  ?Substance and Sexual Activity  ? Alcohol use: Yes  ?  Comment: social  ? Drug use: No  ? Sexual activity: Yes  ?Other Topics Concern  ? Not on file  ?Social History Narrative  ? Not on file  ? ?Social Determinants of Health  ? ?Financial Resource Strain: Not on file  ?Food Insecurity: Not on file  ?Transportation Needs: Not on file  ?Physical Activity: Not on file  ?Stress: Not on file  ?Social Connections: Not on file  ?Intimate Partner Violence: Not on file  ? ? ?FAMILY HISTORY: ?Family History  ?Problem Relation Age of Onset  ? Ovarian cancer Sister 44  ? CVA Other   ? Breast cancer Other   ?     maternal grandmother's sister, dx 25s, d. 56s  ? Colon cancer Neg Hx   ? Colon polyps Neg Hx   ? Esophageal cancer Neg Hx   ? Rectal cancer Neg Hx   ? Stomach cancer Neg Hx   ? ? ?Review of Systems  ?Constitutional:  Negative for appetite change, chills, fatigue, fever and unexpected weight change.  ?HENT:   Negative for hearing loss, lump/mass and trouble swallowing.   ?Eyes:  Negative for eye problems and icterus.  ?Respiratory:  Negative for chest tightness, cough and shortness of breath.   ?Cardiovascular:  Negative  for chest pain, leg swelling and palpitations.  ?Gastrointestinal:  Negative for abdominal distention, abdominal pain, constipation, diarrhea, nausea and vomiting.  ?Endocrine: Negative for hot flashes.  ?Genitourinary:  Negative for difficulty urinating.   ?Musculoskeletal:  Negative for arthralgias.  ?Skin:  Negative for itching and rash.  ?Neurological:  Negative for dizziness, extremity weakness, headaches and numbness.  ?Hematological:  Negative for adenopathy. Does not bruise/bleed easily.  ?Psychiatric/Behavioral:  Negative for depression. The patient is not nervous/anxious.    ? ? ?PHYSICAL EXAMINATION ? ?ECOG PERFORMANCE STATUS: 1 - Symptomatic but completely ambulatory ? ?Vitals:  ? 02/14/22 0913  ?BP: 120/83  ?Pulse: 71  ?Resp: 16  ?Temp: 97.7 ?F (36.5 ?C)  ?SpO2: 100%  ? ? ?Physical Exam ?Constitutional:   ?   General: She is not in acute distress. ?   Appearance: Normal appearance. She is not toxic-appearing.  ?HENT:  ?  Head: Normocephalic and atraumatic.  ?Eyes:  ?   General: No scleral icterus. ?Cardiovascular:  ?   Rate and Rhythm: Normal rate and regular rhythm.  ?   Pulses: Normal pulses.  ?   Heart sounds: Normal heart sounds.  ?Pulmonary:  ?   Effort: Pulmonary effort is normal.  ?   Breath sounds: Normal breath sounds.  ?Chest:  ?   Comments: Fullness noted in her breast bilaterally.  No obvious sign of recurrence. ?Abdominal:  ?   General: Abdomen is flat. Bowel sounds are normal. There is no distension.  ?   Palpations: Abdomen is soft.  ?   Tenderness: There is no abdominal tenderness.  ?Musculoskeletal:     ?   General: No swelling.  ?   Cervical back: Neck supple.  ?Lymphadenopathy:  ?   Cervical: No cervical adenopathy.  ?Skin: ?   General: Skin is warm and dry.  ?   Findings: No rash.  ?Neurological:  ?   General: No focal deficit present.  ?   Mental Status: She is alert.  ?Psychiatric:     ?   Mood and Affect: Mood normal.     ?   Behavior: Behavior normal.  ? ? ?LABORATORY  DATA: ? ?CBC ?   ?Component Value Date/Time  ? WBC 4.0 11/21/2021 0757  ? RBC 3.99 11/21/2021 0757  ? HGB 12.3 11/21/2021 0757  ? HGB 13.1 08/03/2020 1536  ? HCT 36.0 11/21/2021 0757  ? PLT 268 11/21/2021 0757  ? PLT

## 2022-02-22 ENCOUNTER — Encounter: Payer: Self-pay | Admitting: Adult Health

## 2022-02-22 ENCOUNTER — Other Ambulatory Visit: Payer: Self-pay

## 2022-02-22 ENCOUNTER — Inpatient Hospital Stay: Payer: 59 | Attending: Oncology | Admitting: Internal Medicine

## 2022-02-22 DIAGNOSIS — T451X5A Adverse effect of antineoplastic and immunosuppressive drugs, initial encounter: Secondary | ICD-10-CM

## 2022-02-22 DIAGNOSIS — G620 Drug-induced polyneuropathy: Secondary | ICD-10-CM | POA: Insufficient documentation

## 2022-02-22 DIAGNOSIS — Z170 Estrogen receptor positive status [ER+]: Secondary | ICD-10-CM | POA: Diagnosis not present

## 2022-02-22 DIAGNOSIS — C50811 Malignant neoplasm of overlapping sites of right female breast: Secondary | ICD-10-CM | POA: Diagnosis present

## 2022-02-22 DIAGNOSIS — Z79810 Long term (current) use of selective estrogen receptor modulators (SERMs): Secondary | ICD-10-CM | POA: Insufficient documentation

## 2022-02-22 DIAGNOSIS — G5603 Carpal tunnel syndrome, bilateral upper limbs: Secondary | ICD-10-CM | POA: Diagnosis not present

## 2022-02-22 NOTE — Progress Notes (Signed)
? ?[facility] [address]  ?[place], [place] [address] ?[phone] ? ? ?New Patient Evaluation ? ?Date of Service: 02/22/22 ?Patient Name: [name] [name] ?Patient MRN: [id number] ?Patient DOB: [birth year] ?Provider: Ventura Sellers, MD ? ?Identifying Statement:  ?[name] [name] is a 51 y.o. female with Chemotherapy-induced neuropathy ([place]) who presents for initial consultation and evaluation regarding cancer associated neurologic deficits.   ? ?Referring Provider: ?Gardenia Phlegm, NP ?[facility] ?[place],  [place] [address] ? ?Primary Cancer: ? ?Oncologic History: ?Oncology History  ?Malignant neoplasm of overlapping sites of right breast in female, estrogen receptor positive ([place])  ?07/01/2020 Initial Diagnosis  ? Malignant neoplasm of overlapping sites of right breast in female, estrogen receptor positive ([place]) ?  ?07/28/2020 Cancer Staging  ? Staging form: Breast, AJCC 8th Edition ?- Pathologic stage from 07/28/2020: Stage IA (pT1c, pN0, cM0, G2, ER+, PR+, HER2-, Oncotype DX score: 28) ?  ?07/28/2020 Surgery  ? Right lumpectomy Marlou Starks) [phone]): multifocal IDC, 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 Margin were negative. 4 lymph nodes were negative. ?  ?08/10/2020 Oncotype testing  ? The Oncotype DX score was 28 predicting a risk of outside the breast recurrence over the next 9 years of 17% if the patient's only systemic therapy is tamoxifen for 5 years.  ?  ?09/02/2020 - 11/04/2020 Chemotherapy  ? adjuvant chemotherapy consisting of docetaxel and cyclophosphamide given every 21 days x 4 started 09/02/2020, completed 11/04/2020 [facility]) ?  ?11/30/2020 - 12/29/2020 Radiation Therapy  ? The patient initially received a dose of 40.05 Gy in 15 fractions to the breast using whole-breast tangent fields. This was delivered using a 3-D conformal technique. The pt received a boost delivering an additional 10 Gy in 5 fractions using a electron boost with 60meV  electrons. The total dose was 50.05 Gy. ?  ?12/2020 -  Anti-estrogen oral therapy  ? Tamoxifen ?  ? ? ?History of Present Illness: ?The patient's records from the referring physician were obtained and reviewed and the patient interviewed to confirm this HPI.  [name] [name] presents today with neuropathic complaints.  She describes several months history of waking up with numbness and tingling in her hands (palm side).  Affects both sides but worse or occasionally recurrent in the right hand.  Symptoms improve later in the morning.  Last taxol chemotherapy was in 2021, no symptoms following that treatment.  No issues with gait, no frank pain or weakness.  The past week her symptoms have improved since switching hormone oral agent for breast cancer.  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 ? ?Medications: ?Current Outpatient Medications on File Prior to Visit  ?Medication Sig Dispense Refill  ? amoxicillin (AMOXIL) 875 MG tablet Take 1 tablet (875 mg total) by mouth 2 (two) times daily. 14 tablet 0  ? cholecalciferol (VITAMIN D3) 25 MCG (1000 UNIT) tablet Take 1 tablet (1,000 Units total) by mouth daily. (Patient not taking: No sig reported)    ? ibuprofen (ADVIL) 200 MG tablet Take 200-400 mg by mouth every 6 (six) hours as needed for headache or moderate pain.     ? oxybutynin (DITROPAN-XL) 10 MG 24 hr tablet Take 10 mg by mouth daily.    ? tamoxifen (NOLVADEX) 20 MG tablet TAKE 1 TABLET BY MOUTH EVERY DAY 30 tablet PRN  ? Tetrahydrozoline HCl (VISINE OP) Place 1 drop into both eyes daily as needed (redness).     ? ?No  current facility-administered medications on file prior to visit.  ? ? ?Allergies:  ?Allergies  ?Allergen Reactions  ? Keflex [Cephalexin] Itching  ? Shellfish Allergy   ?  Throat and ear itching   ? ?Past Medical History:  ?Past Medical History:  ?Diagnosis Date  ? Asthma   ? Cancer [facility])   ? Family history of breast cancer 07/08/2020  ? Family history of ovarian cancer  07/08/2020  ? Genital herpes   ? History of radiation therapy 11/30/2020-12/29/2020  ? right breast   Dr Gery Pray  ? ?Past Surgical History:  ?Past Surgical History:  ?Procedure Laterality Date  ? BREAST LUMPECTOMY WITH RADIOACTIVE SEED AND SENTINEL LYMPH NODE BIOPSY Right 07/28/2020  ? Procedure: RIGHT BREAST LUMPECTOMY X 3  WITH RADIOACTIVE SEED AND SENTINEL LYMPH NODE BIOPSY;  Surgeon: Jovita Kussmaul, MD;  Location: [facility];  Service: General;  Laterality: Right;  PEC BLOCK  ? FOOT SURGERY Right   ? MOUTH SURGERY Right   ? ?Social History:  ?Social History  ? ?Socioeconomic History  ? Marital status: Married  ?  Spouse name: Not on file  ? Number of children: Not on file  ? Years of education: Not on file  ? Highest education level: Not on file  ?Occupational History  ? Not on file  ?Tobacco Use  ? Smoking status: Never  ? Smokeless tobacco: Never  ?Vaping Use  ? Vaping Use: Never used  ?Substance and Sexual Activity  ? Alcohol use: Yes  ?  Comment: social  ? Drug use: No  ? Sexual activity: Yes  ?Other Topics Concern  ? Not on file  ?Social History Narrative  ? Not on file  ? ?Social Determinants of Health  ? ?Financial Resource Strain: Not on file  ?Food Insecurity: Not on file  ?Transportation Needs: Not on file  ?Physical Activity: Not on file  ?Stress: Not on file  ?Social Connections: Not on file  ?Intimate Partner Violence: Not on file  ? ?Family History:  ?Family History  ?Problem Relation Age of Onset  ? Ovarian cancer Sister 23  ? CVA Other   ? Breast cancer Other   ?     maternal grandmother's sister, dx 16s, d. 74s  ? Colon cancer Neg Hx   ? Colon polyps Neg Hx   ? Esophageal cancer Neg Hx   ? Rectal cancer Neg Hx   ? Stomach cancer Neg Hx   ? ? ?Review of Systems: ?Constitutional: Doesn't report fevers, chills or abnormal weight loss ?Eyes: Doesn't report blurriness of vision ?Ears, nose, mouth, throat, and face: Doesn't report sore throat ?Respiratory: Doesn't report cough, dyspnea or  wheezes ?Cardiovascular: Doesn't report palpitation, chest discomfort  ?Gastrointestinal:  Doesn't report nausea, constipation, diarrhea ?GU: Doesn't report incontinence ?Skin: Doesn't report skin rashes ?Neurological: Per HPI ?Musculoskeletal: Doesn't report joint pain ?Behavioral/Psych: Doesn't report anxiety ? ?Physical Exam: ?Vitals:  ? 02/22/22 0918  ?BP: 107/78  ?Pulse: 62  ?Resp: 20  ?Temp: 98.1 ?F (36.7 ?C)  ?SpO2: 100%  ? ?KPS: 90. ?General: Alert, cooperative, pleasant, in no acute distress ?Head: Normal ?EENT: No conjunctival injection or scleral icterus.  ?Lungs: Resp effort normal ?Cardiac: Regular rate ?Abdomen: Non-distended abdomen ?Skin: No rashes cyanosis or petechiae. ?Extremities: No clubbing or edema ? ?Neurologic Exam: ?Mental Status: Awake, alert, attentive to examiner. Oriented to self and environment. Language is fluent with intact comprehension.  ?Cranial Nerves: Visual acuity is grossly normal. Visual fields are full. Extra-ocular movements intact. No ptosis. Face  is symmetric ?Motor: Tone and bulk are normal. Power is full in both arms and legs. Reflexes are symmetric, no pathologic reflexes present.  ?Sensory: Intact to light touch ?Gait: Normal. ? ? ?Labs: ?I have reviewed the data as listed ?   ?Component Value Date/Time  ? NA 141 11/21/2021 0757  ? K 4.4 11/21/2021 0757  ? CL 109 11/21/2021 0757  ? CO2 27 11/21/2021 0757  ? GLUCOSE 96 11/21/2021 0757  ? BUN 14 11/21/2021 0757  ? CREATININE 0.94 11/21/2021 0757  ? CREATININE 0.91 10/18/2021 0849  ? CALCIUM 9.1 11/21/2021 0757  ? PROT 6.6 11/21/2021 0757  ? ALBUMIN 4.0 11/21/2021 0757  ? AST 30 11/21/2021 0757  ? AST 113 (H) 10/18/2021 0849  ? ALT 19 11/21/2021 0757  ? ALT 52 (H) 10/18/2021 0849  ? ALKPHOS 65 11/21/2021 0757  ? BILITOT 0.8 11/21/2021 0757  ? BILITOT 0.6 10/18/2021 0849  ? GFRNONAA >60 11/21/2021 0757  ? GFRNONAA >60 10/18/2021 0849  ? GFRAA >60 08/03/2020 1536  ? ?Lab Results  ?Component Value Date  ? WBC 4.0 11/21/2021   ? NEUTROABS 2.0 11/21/2021  ? HGB 12.3 11/21/2021  ? HCT 36.0 11/21/2021  ? MCV 90.2 11/21/2021  ? PLT 268 11/21/2021  ? ? ? ?Assessment/Plan ?Chemotherapy-induced neuropathy ([place]) ? ?[name] [name] presents with clinical

## 2022-04-02 ENCOUNTER — Telehealth: Payer: Self-pay | Admitting: Hematology and Oncology

## 2022-04-02 NOTE — Telephone Encounter (Signed)
Rescheduled appointment per provider PAL. Left message. 

## 2022-04-26 ENCOUNTER — Other Ambulatory Visit: Payer: 59

## 2022-04-26 ENCOUNTER — Ambulatory Visit: Payer: 59 | Admitting: Hematology and Oncology

## 2022-05-04 NOTE — Progress Notes (Signed)
Patient Care Team: [name], [name], MD as PCP - General (Family Medicine) Jovita Kussmaul, MD as Consulting Physician (General Surgery) Gery Pray, MD as Consulting Physician (Radiation Oncology) Janyth Pupa, DO as Consulting Physician (Obstetrics and Gynecology) Claris Pong, MD as Referring Physician (Hematology and Oncology)  DIAGNOSIS:  Encounter Diagnosis  Name Primary?   Malignant neoplasm of overlapping sites of right breast in female, estrogen receptor positive ([place])     SUMMARY OF ONCOLOGIC HISTORY: Oncology History  Malignant neoplasm of overlapping sites of right breast in female, estrogen receptor positive ([place])  07/01/2020 Initial Diagnosis   Malignant neoplasm of overlapping sites of right breast in female, estrogen receptor positive ([place])   07/28/2020 Cancer Staging   Staging form: Breast, AJCC 8th Edition - Pathologic stage from 07/28/2020: Stage IA (pT1c, pN0, cM0, G2, ER+, PR+, HER2-, Oncotype DX score: 28)   07/28/2020 Surgery   Right lumpectomy Marlou Starks) [phone]): multifocal IDC, 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 Margin were negative. 4 lymph nodes were negative.   08/10/2020 Oncotype testing   The Oncotype DX score was 28 predicting a risk of outside the breast recurrence over the next 9 years of 17% if the patient's only systemic therapy is tamoxifen for 5 years.    09/02/2020 - 11/04/2020 Chemotherapy   adjuvant chemotherapy consisting of docetaxel and cyclophosphamide given every 21 days x 4 started 09/02/2020, completed 11/04/2020 [facility])   11/30/2020 - 12/29/2020 Radiation Therapy   The patient initially received a dose of 40.05 Gy in 15 fractions to the breast using whole-breast tangent fields. This was delivered using a 3-D conformal technique. The pt received a boost delivering an additional 10 Gy in 5 fractions using a electron boost with 61mV electrons. The total dose was 50.05 Gy.   12/2020 -  Anti-estrogen oral therapy    Tamoxifen     CHIEF COMPLIANT: Follow-up on Letrozole establish care with Dr. GLindi Adie INTERVAL HISTORY: JZaynab Chipmanis a 51y.o with above mention Estrogen receptor positive breast cancer. She presents to the clinic today for annual follow-up. She states that she started the anastrozole for about 30 days and the switched to letrozole. States that she was noticing brain fog. States that it is not interrupting her life style she just notice it caught her attention. Denies joint pain. she tolerating the letrozole much better. she does exercise regularly. she goes to the gym twice a week.     ALLERGIES:  is allergic to keflex [cephalexin] and shellfish allergy.  MEDICATIONS:  Current Outpatient Medications  Medication Sig Dispense Refill   letrozole (FEMARA) 2.5 MG tablet Take 1 tablet (2.5 mg total) by mouth daily. 90 tablet 3   cholecalciferol (VITAMIN D3) 25 MCG (1000 UNIT) tablet Take 1,000 Units by mouth daily.     ibuprofen (ADVIL) 200 MG tablet Take 200-400 mg by mouth every 6 (six) hours as needed for headache or moderate pain.      Tetrahydrozoline HCl (VISINE OP) Place 1 drop into both eyes daily as needed (redness).      No current facility-administered medications for this visit.    PHYSICAL EXAMINATION: ECOG PERFORMANCE STATUS: 1 - Symptomatic but completely ambulatory  Vitals:   05/18/22 0939  BP: 117/82  Pulse: 67  Resp: 18  Temp: 97.9 F (36.6 C)  SpO2: 100%   Filed Weights   05/18/22 0939  Weight: 144 lb 11.2 oz (65.6 kg)      LABORATORY DATA:  I have reviewed the  data as listed    Latest Ref Rng & Units 05/18/2022    9:23 AM 11/21/2021    7:57 AM 10/18/2021    8:49 AM  CMP  Glucose 70 - 99 mg/dL 95  96  98   BUN 6 - 20 mg/dL '13  14  12   ' Creatinine 0.44 - 1.00 mg/dL 0.89  0.94  0.91   Sodium 135 - 145 mmol/L 140  141  137   Potassium 3.5 - 5.1 mmol/L 4.1  4.4  4.0   Chloride 98 - 111 mmol/L 109  109  107   CO2 22 - 32 mmol/L '25  27  27    ' Calcium 8.9 - 10.3 mg/dL 9.4  9.1  8.9   Total Protein 6.5 - 8.1 g/dL 6.9  6.6  6.7   Total Bilirubin 0.3 - 1.2 mg/dL 0.3  0.8  0.6   Alkaline Phos 38 - 126 U/L 73  65  69   AST 15 - 41 U/L 20  30  113   ALT 0 - 44 U/L 15  19  52     Lab Results  Component Value Date   WBC 4.1 05/18/2022   HGB 12.4 05/18/2022   HCT 35.5 (L) 05/18/2022   MCV 88.5 05/18/2022   PLT 281 05/18/2022   NEUTROABS 1.8 05/18/2022    ASSESSMENT & PLAN:  Malignant neoplasm of overlapping sites of right breast in female, estrogen receptor positive ([place]) 07/01/2020: history of stage Ia estrogen positive breast cancer status post right lumpectomy, adjuvant chemotherapy (TC x 4), adjuvant radiation (completed 12/29/20), and continuing on tamoxifen (since 12/2020), switched to anastrozole and then switched to letrozole [facility])  Letrozole toxicities: Tolerating it well except for cloudiness in the head.  Breast Cancer Surveillance: 1. Breast Exam: 05/18/22: Benign 2. Mammograms 04/18/2022: Benign 3. Bone Density: 04/18/21: T score -1.5: Osteopenia  CT CAP 01/10/22: Benign CT Head 12/07/21: Neg  Return to clinic in 6 months for follow-up.  No orders of the defined types were placed in this encounter.  The patient has a good understanding of the overall plan. she agrees with it. she will call with any problems that may develop before the next visit here. Total time spent: 30 mins including face to face time and time spent for planning, charting and co-ordination of care   [name], MD 05/18/22    I Gardiner Coins am scribing for Dr. Lindi Adie  I have reviewed the above documentation for accuracy and completeness, and I agree with the above.

## 2022-05-17 ENCOUNTER — Other Ambulatory Visit: Payer: Self-pay

## 2022-05-17 DIAGNOSIS — Z170 Estrogen receptor positive status [ER+]: Secondary | ICD-10-CM

## 2022-05-18 ENCOUNTER — Inpatient Hospital Stay: Payer: 59 | Admitting: Hematology and Oncology

## 2022-05-18 ENCOUNTER — Other Ambulatory Visit: Payer: Self-pay

## 2022-05-18 ENCOUNTER — Inpatient Hospital Stay: Payer: 59 | Attending: Oncology

## 2022-05-18 DIAGNOSIS — M8580 Other specified disorders of bone density and structure, unspecified site: Secondary | ICD-10-CM | POA: Diagnosis not present

## 2022-05-18 DIAGNOSIS — Z170 Estrogen receptor positive status [ER+]: Secondary | ICD-10-CM | POA: Diagnosis not present

## 2022-05-18 DIAGNOSIS — C50811 Malignant neoplasm of overlapping sites of right female breast: Secondary | ICD-10-CM | POA: Diagnosis present

## 2022-05-18 LAB — CBC WITH DIFFERENTIAL (CANCER CENTER ONLY)
Abs Immature Granulocytes: 0.01 10*3/uL (ref 0.00–0.07)
Basophils Absolute: 0 10*3/uL (ref 0.0–0.1)
Basophils Relative: 1 %
Eosinophils Absolute: 0 10*3/uL (ref 0.0–0.5)
Eosinophils Relative: 1 %
HCT: 35.5 % — ABNORMAL LOW (ref 36.0–46.0)
Hemoglobin: 12.4 g/dL (ref 12.0–15.0)
Immature Granulocytes: 0 %
Lymphocytes Relative: 47 %
Lymphs Abs: 1.9 10*3/uL (ref 0.7–4.0)
MCH: 30.9 pg (ref 26.0–34.0)
MCHC: 34.9 g/dL (ref 30.0–36.0)
MCV: 88.5 fL (ref 80.0–100.0)
Monocytes Absolute: 0.4 10*3/uL (ref 0.1–1.0)
Monocytes Relative: 9 %
Neutro Abs: 1.8 10*3/uL (ref 1.7–7.7)
Neutrophils Relative %: 42 %
Platelet Count: 281 10*3/uL (ref 150–400)
RBC: 4.01 MIL/uL (ref 3.87–5.11)
RDW: 11.1 % — ABNORMAL LOW (ref 11.5–15.5)
WBC Count: 4.1 10*3/uL (ref 4.0–10.5)
nRBC: 0 % (ref 0.0–0.2)

## 2022-05-18 LAB — CMP (CANCER CENTER ONLY)
ALT: 15 U/L (ref 0–44)
AST: 20 U/L (ref 15–41)
Albumin: 4.2 g/dL (ref 3.5–5.0)
Alkaline Phosphatase: 73 U/L (ref 38–126)
Anion gap: 6 (ref 5–15)
BUN: 13 mg/dL (ref 6–20)
CO2: 25 mmol/L (ref 22–32)
Calcium: 9.4 mg/dL (ref 8.9–10.3)
Chloride: 109 mmol/L (ref 98–111)
Creatinine: 0.89 mg/dL (ref 0.44–1.00)
GFR, Estimated: >60 mL/min (ref >60)
Glucose, Bld: 95 mg/dL (ref 70–99)
Potassium: 4.1 mmol/L (ref 3.5–5.1)
Sodium: 140 mmol/L (ref 135–145)
Total Bilirubin: 0.3 mg/dL (ref 0.3–1.2)
Total Protein: 6.9 g/dL (ref 6.5–8.1)

## 2022-05-18 MED ORDER — LETROZOLE 2.5 MG PO TABS: 2.5000 mg | ORAL_TABLET | Freq: Every day | ORAL | 3 refills | Status: DC

## 2022-05-18 NOTE — Assessment & Plan Note (Addendum)
07/01/2020: history of stage Ia estrogen positive breast cancer status post right lumpectomy, adjuvant chemotherapy (AC x 4), adjuvant radiation (completed 12/29/20), and continuing on tamoxifen (since 12/2020)  Anastrozole toxicities:  Breast Cancer Surveillance: 1. Breast Exam: 05/18/22: Benign 2. Mammograms 3. Bone Density: 04/18/21: T score -1.5: Osteopenia  CT CAP 01/10/22: Benign CT Head 12/07/21: Neg

## 2022-05-21 ENCOUNTER — Encounter: Payer: Self-pay | Admitting: Hematology and Oncology

## 2022-05-30 ENCOUNTER — Other Ambulatory Visit: Payer: Self-pay | Admitting: *Deleted

## 2022-05-30 MED ORDER — ANASTROZOLE 1 MG PO TABS: 1.0000 mg | ORAL_TABLET | Freq: Every day | ORAL | 0 refills | Status: DC

## 2022-05-30 NOTE — Progress Notes (Signed)
Received message from pt requesting to change Letrozole to Anastrozole due to symptoms of cloudiness in the head while on letrozole.  RN reviewed with MD and verbal orders received to proceed with Anastrozole 1 mg p.o.  prescription sent to pharmacy on file. Pt educated and verbalized understanding.

## 2022-06-01 ENCOUNTER — Other Ambulatory Visit: Payer: Self-pay

## 2022-06-01 ENCOUNTER — Other Ambulatory Visit: Payer: Self-pay | Admitting: *Deleted

## 2022-06-01 NOTE — Progress Notes (Signed)
error 

## 2022-06-06 ENCOUNTER — Other Ambulatory Visit: Payer: Self-pay | Admitting: *Deleted

## 2022-06-06 ENCOUNTER — Telehealth: Payer: Self-pay | Admitting: Hematology and Oncology

## 2022-06-06 DIAGNOSIS — Z170 Estrogen receptor positive status [ER+]: Secondary | ICD-10-CM

## 2022-06-06 NOTE — Telephone Encounter (Signed)
Scheduled appointment per 7/19 staff message. Patient is aware.

## 2022-06-06 NOTE — Progress Notes (Signed)
Received call from pt stating she would like to have [facility] testing drawn in office and not with the mobile service.  Genetic screening orders placed and lab appts will be scheduled once RN obtains pt sequencing calendar from [facility].

## 2022-06-08 ENCOUNTER — Other Ambulatory Visit: Payer: Self-pay

## 2022-06-08 ENCOUNTER — Inpatient Hospital Stay: Payer: 59 | Attending: Oncology

## 2022-06-08 ENCOUNTER — Encounter: Payer: Self-pay | Admitting: Hematology and Oncology

## 2022-06-08 DIAGNOSIS — Z170 Estrogen receptor positive status [ER+]: Secondary | ICD-10-CM

## 2022-06-08 LAB — GENETIC SCREENING ORDER

## 2022-06-13 ENCOUNTER — Telehealth: Payer: Self-pay | Admitting: Hematology and Oncology

## 2022-06-13 NOTE — Telephone Encounter (Signed)
Scheduled appointment per 7/19 staff message. Patient is aware of the made appointments.

## 2022-06-21 ENCOUNTER — Encounter: Payer: Self-pay | Admitting: Hematology and Oncology

## 2022-07-02 ENCOUNTER — Telehealth: Payer: Self-pay

## 2022-07-02 NOTE — Telephone Encounter (Signed)
Called pt and LVM for call back to make her aware her [employer]ater[employer] testing was negative.

## 2022-07-02 NOTE — Telephone Encounter (Signed)
S/w pt regarding [employer]atera results. Advised pt her results were negative/not detected. She states her insurance advised her they did not cover testing. Advised pt to call her insurance and she was given [employer]'s customer service number as well. She knows to call with any further questions/concerns.

## 2022-07-20 NOTE — Progress Notes (Signed)
Patient Care Team: [name], [name], MD as PCP - General (Family Medicine) Jovita Kussmaul, MD as Consulting Physician (General Surgery) Gery Pray, MD as Consulting Physician (Radiation Oncology) Janyth Pupa, DO as Consulting Physician (Obstetrics and Gynecology) Claris Pong, MD as Referring Physician (Hematology and Oncology)  DIAGNOSIS: No diagnosis found.  SUMMARY OF ONCOLOGIC HISTORY: Oncology History  Malignant neoplasm of overlapping sites of right breast in female, estrogen receptor positive ([place])  07/01/2020 Initial Diagnosis   Malignant neoplasm of overlapping sites of right breast in female, estrogen receptor positive ([place])   07/28/2020 Cancer Staging   Staging form: Breast, AJCC 8th Edition - Pathologic stage from 07/28/2020: Stage IA (pT1c, pN0, cM0, G2, ER+, PR+, HER2-, Oncotype DX score: 28)   07/28/2020 Surgery   Right lumpectomy Marlou Starks) [phone]): multifocal IDC, 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 Margin were negative. 4 lymph nodes were negative.   08/10/2020 Oncotype testing   The Oncotype DX score was 28 predicting a risk of outside the breast recurrence over the next 9 years of 17% if the patient's only systemic therapy is tamoxifen for 5 years.    09/02/2020 - 11/04/2020 Chemotherapy   adjuvant chemotherapy consisting of docetaxel and cyclophosphamide given every 21 days x 4 started 09/02/2020, completed 11/04/2020 [facility])   11/30/2020 - 12/29/2020 Radiation Therapy   The patient initially received a dose of 40.05 Gy in 15 fractions to the breast using whole-breast tangent fields. This was delivered using a 3-D conformal technique. The pt received a boost delivering an additional 10 Gy in 5 fractions using a electron boost with 44mV electrons. The total dose was 50.05 Gy.   12/2020 -  Anti-estrogen oral therapy   Tamoxifen     CHIEF COMPLIANT: Follow-up to review tolerance to [place]  INTERVAL HISTORY: [name] Assefais a  51 y.o with above mention Estrogen receptor positive breast cancer. She presents to the clinic today for annual follow-up.   ALLERGIES:  is allergic to keflex [cephalexin] and shellfish allergy.  MEDICATIONS:  Current Outpatient Medications  Medication Sig Dispense Refill   anastrozole (ARIMIDEX) 1 MG tablet Take 1 tablet (1 mg total) by mouth daily. 90 tablet 0   cholecalciferol (VITAMIN D3) 25 MCG (1000 UNIT) tablet Take 1,000 Units by mouth daily.     ibuprofen (ADVIL) 200 MG tablet Take 200-400 mg by mouth every 6 (six) hours as needed for headache or moderate pain.      Tetrahydrozoline HCl (VISINE OP) Place 1 drop into both eyes daily as needed (redness).      No current facility-administered medications for this visit.    PHYSICAL EXAMINATION: ECOG PERFORMANCE STATUS: {CHL ONC ECOG PS:[id number]}  There were no vitals filed for this visit. There were no vitals filed for this visit.  BREAST:*** No palpable masses or nodules in either right or left breasts. No palpable axillary supraclavicular or infraclavicular adenopathy no breast tenderness or nipple discharge. (exam performed in the presence of a chaperone)  LABORATORY DATA:  I have reviewed the data as listed    Latest Ref Rng & Units 05/18/2022    9:23 AM 11/21/2021    7:57 AM 10/18/2021    8:49 AM  CMP  Glucose 70 - 99 mg/dL 95  96  98   BUN 6 - 20 mg/dL '13  14  12   ' Creatinine 0.44 - 1.00 mg/dL 0.89  0.94  0.91   Sodium 135 - 145 mmol/L 140  141  137  Potassium 3.5 - 5.1 mmol/L 4.1  4.4  4.0   Chloride 98 - 111 mmol/L 109  109  107   CO2 22 - 32 mmol/L '25  27  27   ' Calcium 8.9 - 10.3 mg/dL 9.4  9.1  8.9   Total Protein 6.5 - 8.1 g/dL 6.9  6.6  6.7   Total Bilirubin 0.3 - 1.2 mg/dL 0.3  0.8  0.6   Alkaline Phos 38 - 126 U/L 73  65  69   AST 15 - 41 U/L 20  30  113   ALT 0 - 44 U/L 15  19  52     Lab Results  Component Value Date   WBC 4.1 05/18/2022   HGB 12.4 05/18/2022   HCT 35.5 (L) 05/18/2022   MCV 88.5  05/18/2022   PLT 281 05/18/2022   NEUTROABS 1.8 05/18/2022    ASSESSMENT & PLAN:  No problem-specific Assessment & Plan notes found for this encounter.    No orders of the defined types were placed in this encounter.  The patient has a good understanding of the overall plan. she agrees with it. she will call with any problems that may develop before the next visit here. Total time spent: 30 mins including face to face time and time spent for planning, charting and co-ordination of care   [name] [name], [employer] 07/20/22  I [name] [name] am scribing for [name] [name]  ***

## 2022-07-25 ENCOUNTER — Other Ambulatory Visit: Payer: Self-pay

## 2022-07-25 ENCOUNTER — Inpatient Hospital Stay: Payer: 59 | Attending: Oncology | Admitting: Hematology and Oncology

## 2022-07-25 VITALS — BP 121/71 | HR 68 | Temp 97.7°F | Resp 14 | Ht 61.0 in | Wt 143.9 lb

## 2022-07-25 DIAGNOSIS — C50811 Malignant neoplasm of overlapping sites of right female breast: Secondary | ICD-10-CM | POA: Insufficient documentation

## 2022-07-25 DIAGNOSIS — Z79811 Long term (current) use of aromatase inhibitors: Secondary | ICD-10-CM | POA: Insufficient documentation

## 2022-07-25 DIAGNOSIS — Z780 Asymptomatic menopausal state: Secondary | ICD-10-CM

## 2022-07-25 DIAGNOSIS — Z170 Estrogen receptor positive status [ER+]: Secondary | ICD-10-CM | POA: Diagnosis not present

## 2022-07-25 MED ORDER — LETROZOLE 2.5 MG PO TABS: 2.5000 mg | ORAL_TABLET | Freq: Every day | ORAL | 3 refills | Status: DC

## 2022-07-25 NOTE — Assessment & Plan Note (Addendum)
07/01/2020: history of stage Ia estrogen positive breast cancer status post right lumpectomy, adjuvant chemotherapy (TC x 4), adjuvant radiation (completed 12/29/20), and continuing on tamoxifen (since 12/2020), switched to anastrozole and then switched to letrozole [facility])  Letrozole toxicities: Tolerating it well except for cloudiness in the head.  Breast Cancer Surveillance: 1. Breast Exam: 05/18/22: Benign 2. Mammograms 04/18/2022: Benign 3. Bone Density: 04/18/21: T score -1.5: Osteopenia 4. Signatera test: Negative  CT CAP 01/10/22: Benign CT Head 12/07/21: Neg  She would like to switch from anastrozole to letrozole because she thinks she felt better on the letrozole in terms of the cloudiness in the head.  With the letrozole she experienced more hair loss but she is willing to take some hair supplements to improve on that.  Vaginal dryness: She will try to use over-the-counter Replens or coconut oil.  She has appointments for Signatera testing and we will see her in January for follow-up.

## 2022-08-22 ENCOUNTER — Encounter: Payer: Self-pay | Admitting: Hematology and Oncology

## 2022-09-02 ENCOUNTER — Encounter: Payer: Self-pay | Admitting: Hematology and Oncology

## 2022-09-11 ENCOUNTER — Other Ambulatory Visit: Payer: Self-pay

## 2022-09-11 ENCOUNTER — Inpatient Hospital Stay: Payer: 59

## 2022-09-17 ENCOUNTER — Encounter: Payer: Self-pay | Admitting: Hematology and Oncology

## 2022-09-17 ENCOUNTER — Inpatient Hospital Stay: Payer: 59 | Attending: Oncology

## 2022-09-17 ENCOUNTER — Other Ambulatory Visit: Payer: Self-pay

## 2022-09-17 DIAGNOSIS — Z170 Estrogen receptor positive status [ER+]: Secondary | ICD-10-CM

## 2022-09-17 LAB — GENETIC SCREENING ORDER

## 2022-09-26 ENCOUNTER — Other Ambulatory Visit: Payer: Self-pay

## 2022-09-26 ENCOUNTER — Encounter: Payer: Self-pay | Admitting: Hematology and Oncology

## 2022-09-26 NOTE — Telephone Encounter (Signed)
Attempted to call pt regarding signatera results lvm for pt to return call back.   

## 2022-09-27 ENCOUNTER — Encounter: Payer: Self-pay | Admitting: Hematology and Oncology

## 2022-09-27 ENCOUNTER — Telehealth: Payer: Self-pay | Admitting: *Deleted

## 2022-09-27 NOTE — Telephone Encounter (Signed)
Per MD request, RN placed call to pt regarding negative (Not Detected) recent Signatera testing.  No answer, LVM for pt to return call to the office.  

## 2022-11-05 ENCOUNTER — Encounter: Payer: Self-pay | Admitting: Hematology and Oncology

## 2022-11-14 ENCOUNTER — Other Ambulatory Visit: Payer: Self-pay

## 2022-11-14 DIAGNOSIS — C50811 Malignant neoplasm of overlapping sites of right female breast: Secondary | ICD-10-CM

## 2022-11-15 NOTE — Progress Notes (Signed)
Patient Care Team: [name], [name], MD as PCP - General (Family Medicine) [name] Kussmaul, MD as Consulting Physician (General Surgery) Gery Pray, MD as Consulting Physician (Radiation Oncology) Janyth Pupa, DO as Consulting Physician (Obstetrics and Gynecology) Claris Pong, MD as Referring Physician (Hematology and Oncology)  DIAGNOSIS:  Encounter Diagnosis  Name Primary?   Malignant neoplasm of overlapping sites of right breast in female, estrogen receptor positive ([place]) Yes    SUMMARY OF ONCOLOGIC HISTORY: Oncology History  Malignant neoplasm of overlapping sites of right breast in female, estrogen receptor positive ([place])  07/01/2020 Initial Diagnosis   Malignant neoplasm of overlapping sites of right breast in female, estrogen receptor positive ([place])   07/28/2020 Cancer Staging   Staging form: Breast, AJCC 8th Edition - Pathologic stage from 07/28/2020: Stage IA (pT1c, pN0, cM0, G2, ER+, PR+, HER2-, Oncotype DX score: 28)   07/28/2020 Surgery   Right lumpectomy Marlou Starks) [phone]): multifocal IDC, 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 Margin were negative. 4 lymph nodes were negative.   08/10/2020 Oncotype testing   The Oncotype DX score was 28 predicting a risk of outside the breast recurrence over the next 9 years of 17% if the patient's only systemic therapy is tamoxifen for 5 years.    09/02/2020 - 11/04/2020 Chemotherapy   adjuvant chemotherapy consisting of docetaxel and cyclophosphamide given every 21 days x 4 started 09/02/2020, completed 11/04/2020 [facility])   11/30/2020 - 12/29/2020 Radiation Therapy   The patient initially received a dose of 40.05 Gy in 15 fractions to the breast using whole-breast tangent fields. This was delivered using a 3-D conformal technique. The pt received a boost delivering an additional 10 Gy in 5 fractions using a electron boost with 21mV electrons. The total dose was 50.05 Gy.   12/2020 -  Anti-estrogen oral  therapy   Tamoxifen     CHIEF COMPLIANT: Follow- up on letrozole  INTERVAL HISTORY: [name] [name] a 51y.o with above mentioned follow-up on letrozole. She presents to the clinic for a follow-up. She states that she has been doing well.  She continues to have hair thinning.  She reports no pain or any discomfort any discomfort in breast. She says that her vaginal dryness issues have improved.    ALLERGIES:  is allergic to keflex [cephalexin] and shellfish allergy.  MEDICATIONS:  Current Outpatient Medications  Medication Sig Dispense Refill   anastrozole (ARIMIDEX) 1 MG tablet Take 1 tablet (1 mg total) by mouth daily. 90 tablet 3   cholecalciferol (VITAMIN D3) 25 MCG (1000 UNIT) tablet Take 1,000 Units by mouth daily.     ibuprofen (ADVIL) 200 MG tablet Take 200-400 mg by mouth every 6 (six) hours as needed for headache or moderate pain.      Tetrahydrozoline HCl (VISINE OP) Place 1 drop into both eyes daily as needed (redness).      No current facility-administered medications for this visit.    PHYSICAL EXAMINATION: ECOG PERFORMANCE STATUS: 1 - Symptomatic but completely ambulatory  Vitals:   11/20/22 0812  BP: 113/75  Pulse: 64  Temp: 98.5 F (36.9 C)  SpO2: 100%   Filed Weights   11/20/22 0812  Weight: 147 lb 6.4 oz (66.9 kg)    BREAST: No palpable masses or nodules in either right or left breasts. No palpable axillary supraclavicular or infraclavicular adenopathy no breast tenderness or nipple discharge. (exam performed in the presence of a chaperone)  LABORATORY DATA:  I have reviewed the data as listed  Latest Ref Rng & Units 05/18/2022    9:23 AM 11/21/2021    7:57 AM 10/18/2021    8:49 AM  CMP  Glucose 70 - 99 mg/dL 95  96  98   BUN 6 - 20 mg/dL _0 Creatinine 0.44 - 1.00 mg/dL 0.89  0.94  0.91   Sodium 135 - 145 mmol/L 140  141  137   Potassium 3.5 - 5.1 mmol/L 4.1  4.4  4.0   Chloride 98 - 111 mmol/L 109  109  107   CO2 22 - 32 mmol/L  _1 Calcium 8.9 - 10.3 mg/dL 9.4  9.1  8.9   Total Protein 6.5 - 8.1 g/dL 6.9  6.6  6.7   Total Bilirubin 0.3 - 1.2 mg/dL 0.3  0.8  0.6   Alkaline Phos 38 - 126 U/L 73  65  69   AST 15 - 41 U/L 20  30  113   ALT 0 - 44 U/L 15  19  52     Lab Results  Component Value Date   WBC 3.9 (L) 11/20/2022   HGB 13.1 11/20/2022   HCT 38.6 11/20/2022   MCV 89.1 11/20/2022   PLT 272 11/20/2022   NEUTROABS 1.4 (L) 11/20/2022    ASSESSMENT & PLAN:  Malignant neoplasm of overlapping sites of right breast in female, estrogen receptor positive ([place]) 07/01/2020: history of stage Ia estrogen positive breast cancer status post right lumpectomy, adjuvant chemotherapy (TC x 4), adjuvant radiation (completed 12/29/20), and continuing on tamoxifen (since 12/2020), switched to anastrozole and then switched to letrozole [facility])   Antiestrogen therapy: She is currently on anastrozole therapy. We briefly discussed exemestane.  She will read more about it and will inform us if she wants to switch to it   Breast Cancer Surveillance: 1. Breast Exam: 11/20/2022: Benign 2. Mammograms 04/18/2022: Benign 3. Bone Density: 04/18/21: T score -1.5: Osteopenia 4.  Signatera testing: Negative   CT CAP 01/10/22: Benign CT Head 12/07/21: Neg   Letrozole toxicities: Vaginal dryness: She will try to use over-the-counter Replens or coconut oil. Hair thinning  Return to clinic in 1 year for follow-up  Orders Placed This Encounter  Procedures   CBC with Differential ([place] Only)    Standing Status:   Future    Standing Expiration Date:   11/21/2023   CMP ([place] only)    Standing Status:   Future    Standing Expiration Date:   11/21/2023   The patient has a good understanding of the overall plan. she agrees with it. she will call with any problems that may develop before the next visit here. Total time spent: 30 mins including face to face time and time spent for planning, charting and  co-ordination of care   [name], MD 11/20/22    I Gardiner Coins am acting as a Education administrator for [employer]  I have reviewed the above documentation for accuracy and completeness, and I agree with the above.

## 2022-11-20 ENCOUNTER — Other Ambulatory Visit: Payer: Self-pay

## 2022-11-20 ENCOUNTER — Inpatient Hospital Stay: Payer: 59

## 2022-11-20 ENCOUNTER — Inpatient Hospital Stay: Payer: 59 | Attending: Oncology | Admitting: Hematology and Oncology

## 2022-11-20 VITALS — BP 113/75 | HR 64 | Temp 98.5°F | Wt 147.4 lb

## 2022-11-20 DIAGNOSIS — Z170 Estrogen receptor positive status [ER+]: Secondary | ICD-10-CM | POA: Insufficient documentation

## 2022-11-20 DIAGNOSIS — Z79811 Long term (current) use of aromatase inhibitors: Secondary | ICD-10-CM | POA: Insufficient documentation

## 2022-11-20 DIAGNOSIS — C50811 Malignant neoplasm of overlapping sites of right female breast: Secondary | ICD-10-CM | POA: Insufficient documentation

## 2022-11-20 LAB — CMP (CANCER CENTER ONLY)
ALT: 16 U/L (ref 0–44)
AST: 24 U/L (ref 15–41)
Albumin: 4.2 g/dL (ref 3.5–5.0)
Alkaline Phosphatase: 91 U/L (ref 38–126)
Anion gap: 6 (ref 5–15)
BUN: 14 mg/dL (ref 6–20)
CO2: 28 mmol/L (ref 22–32)
Calcium: 9.5 mg/dL (ref 8.9–10.3)
Chloride: 106 mmol/L (ref 98–111)
Creatinine: 0.91 mg/dL (ref 0.44–1.00)
GFR, Estimated: >60 mL/min (ref >60)
Glucose, Bld: 93 mg/dL (ref 70–99)
Potassium: 3.8 mmol/L (ref 3.5–5.1)
Sodium: 140 mmol/L (ref 135–145)
Total Bilirubin: 0.5 mg/dL (ref 0.3–1.2)
Total Protein: 7 g/dL (ref 6.5–8.1)

## 2022-11-20 LAB — CBC WITH DIFFERENTIAL (CANCER CENTER ONLY)
Abs Immature Granulocytes: 0 10*3/uL (ref 0.00–0.07)
Basophils Absolute: 0 10*3/uL (ref 0.0–0.1)
Basophils Relative: 1 %
Eosinophils Absolute: 0.1 10*3/uL (ref 0.0–0.5)
Eosinophils Relative: 1 %
HCT: 38.6 % (ref 36.0–46.0)
Hemoglobin: 13.1 g/dL (ref 12.0–15.0)
Immature Granulocytes: 0 %
Lymphocytes Relative: 54 %
Lymphs Abs: 2.1 10*3/uL (ref 0.7–4.0)
MCH: 30.3 pg (ref 26.0–34.0)
MCHC: 33.9 g/dL (ref 30.0–36.0)
MCV: 89.1 fL (ref 80.0–100.0)
Monocytes Absolute: 0.4 10*3/uL (ref 0.1–1.0)
Monocytes Relative: 9 %
Neutro Abs: 1.4 10*3/uL — ABNORMAL LOW (ref 1.7–7.7)
Neutrophils Relative %: 35 %
Platelet Count: 272 10*3/uL (ref 150–400)
RBC: 4.33 MIL/uL (ref 3.87–5.11)
RDW: 11.3 % — ABNORMAL LOW (ref 11.5–15.5)
WBC Count: 3.9 10*3/uL — ABNORMAL LOW (ref 4.0–10.5)
nRBC: 0 % (ref 0.0–0.2)

## 2022-11-20 MED ORDER — ANASTROZOLE 1 MG PO TABS: 1.0000 mg | ORAL_TABLET | Freq: Every day | ORAL | 3 refills | Status: DC

## 2022-11-20 NOTE — Assessment & Plan Note (Signed)
07/01/2020: history of stage Ia estrogen positive breast cancer status post right lumpectomy, adjuvant chemotherapy (TC x 4), adjuvant radiation (completed 12/29/20), and continuing on tamoxifen (since 12/2020), switched to anastrozole and then switched to letrozole [facility])   Antiestrogen therapy: We are switching her back to letrozole because she is between anastrozole and letrozole she has felt better on the letrozole.   Breast Cancer Surveillance: 1. Breast Exam: 11/20/2022: Benign 2. Mammograms 04/18/2022: Benign 3. Bone Density: 04/18/21: T score -1.5: Osteopenia 4.  Signatera testing: Negative   CT CAP 01/10/22: Benign CT Head 12/07/21: Neg   Letrozole toxicities: Vaginal dryness: She will try to use over-the-counter Replens or coconut oil. Hair thinning

## 2022-11-22 ENCOUNTER — Telehealth: Payer: Self-pay | Admitting: Hematology and Oncology

## 2022-11-22 NOTE — Telephone Encounter (Signed)
Scheduled appointment per 1/2 los. Patient is aware. 

## 2022-12-10 ENCOUNTER — Encounter: Payer: Self-pay | Admitting: Adult Health

## 2022-12-12 ENCOUNTER — Encounter: Payer: Self-pay | Admitting: Hematology and Oncology

## 2022-12-12 ENCOUNTER — Inpatient Hospital Stay: Payer: 59

## 2022-12-12 ENCOUNTER — Other Ambulatory Visit: Payer: Self-pay

## 2022-12-12 DIAGNOSIS — C50811 Malignant neoplasm of overlapping sites of right female breast: Secondary | ICD-10-CM | POA: Diagnosis not present

## 2022-12-12 DIAGNOSIS — Z170 Estrogen receptor positive status [ER+]: Secondary | ICD-10-CM

## 2022-12-12 LAB — CBC WITH DIFFERENTIAL (CANCER CENTER ONLY)
Abs Immature Granulocytes: 0.01 10*3/uL (ref 0.00–0.07)
Basophils Absolute: 0 10*3/uL (ref 0.0–0.1)
Basophils Relative: 1 %
Eosinophils Absolute: 0 10*3/uL (ref 0.0–0.5)
Eosinophils Relative: 1 %
HCT: 37.4 % (ref 36.0–46.0)
Hemoglobin: 12.9 g/dL (ref 12.0–15.0)
Immature Granulocytes: 0 %
Lymphocytes Relative: 50 %
Lymphs Abs: 1.8 10*3/uL (ref 0.7–4.0)
MCH: 30.6 pg (ref 26.0–34.0)
MCHC: 34.5 g/dL (ref 30.0–36.0)
MCV: 88.6 fL (ref 80.0–100.0)
Monocytes Absolute: 0.3 10*3/uL (ref 0.1–1.0)
Monocytes Relative: 9 %
Neutro Abs: 1.4 10*3/uL — ABNORMAL LOW (ref 1.7–7.7)
Neutrophils Relative %: 39 %
Platelet Count: 304 10*3/uL (ref 150–400)
RBC: 4.22 MIL/uL (ref 3.87–5.11)
RDW: 11.7 % (ref 11.5–15.5)
WBC Count: 3.5 10*3/uL — ABNORMAL LOW (ref 4.0–10.5)
nRBC: 0 % (ref 0.0–0.2)

## 2022-12-12 LAB — CMP (CANCER CENTER ONLY)
ALT: 20 U/L (ref 0–44)
AST: 25 U/L (ref 15–41)
Albumin: 4.1 g/dL (ref 3.5–5.0)
Alkaline Phosphatase: 102 U/L (ref 38–126)
Anion gap: 7 (ref 5–15)
BUN: 15 mg/dL (ref 6–20)
CO2: 27 mmol/L (ref 22–32)
Calcium: 9.6 mg/dL (ref 8.9–10.3)
Chloride: 108 mmol/L (ref 98–111)
Creatinine: 0.97 mg/dL (ref 0.44–1.00)
GFR, Estimated: >60 mL/min (ref >60)
Glucose, Bld: 96 mg/dL (ref 70–99)
Potassium: 3.8 mmol/L (ref 3.5–5.1)
Sodium: 142 mmol/L (ref 135–145)
Total Bilirubin: 0.6 mg/dL (ref 0.3–1.2)
Total Protein: 7.3 g/dL (ref 6.5–8.1)

## 2022-12-12 LAB — GENETIC SCREENING ORDER

## 2022-12-28 ENCOUNTER — Telehealth: Payer: Self-pay

## 2022-12-28 NOTE — Telephone Encounter (Signed)
Called pt per MD to advise Signatera testing was negative/not detected. Pt verbalized understanding of results and knows [facility] will be in touch to schedule 3 mo repeat lab.   

## 2022-12-31 ENCOUNTER — Encounter: Payer: Self-pay | Admitting: Hematology and Oncology

## 2023-01-02 ENCOUNTER — Encounter: Payer: Self-pay | Admitting: Hematology and Oncology

## 2023-01-28 ENCOUNTER — Other Ambulatory Visit: Payer: Self-pay | Admitting: Obstetrics and Gynecology

## 2023-01-28 ENCOUNTER — Other Ambulatory Visit (HOSPITAL_COMMUNITY)
Admission: RE | Admit: 2023-01-28 | Discharge: 2023-01-28 | Disposition: A | Discharge status: Home / Self Care | Payer: 59 | Source: Ambulatory Visit | Attending: Obstetrics and Gynecology | Admitting: Obstetrics and Gynecology

## 2023-01-28 DIAGNOSIS — Z01419 Encounter for gynecological examination (general) (routine) without abnormal findings: Secondary | ICD-10-CM | POA: Diagnosis present

## 2023-01-30 LAB — CYTOLOGY - PAP
Comment: NEGATIVE
Diagnosis: NEGATIVE
High risk HPV: NEGATIVE

## 2023-02-05 ENCOUNTER — Other Ambulatory Visit: Payer: Self-pay | Admitting: *Deleted

## 2023-02-05 DIAGNOSIS — Z170 Estrogen receptor positive status [ER+]: Secondary | ICD-10-CM

## 2023-02-05 NOTE — Progress Notes (Signed)
Received message from [facility] team stating pt is due to have order renewed.  Orders placed.

## 2023-02-19 ENCOUNTER — Encounter: Payer: Self-pay | Admitting: Hematology and Oncology

## 2023-02-20 ENCOUNTER — Encounter: Payer: Self-pay | Admitting: *Deleted

## 2023-02-20 ENCOUNTER — Telehealth: Payer: Self-pay | Admitting: Hematology and Oncology

## 2023-02-20 NOTE — Telephone Encounter (Signed)
Scheduled appointment per scheduling message. Left voicemail.  

## 2023-02-20 NOTE — Progress Notes (Unsigned)
Received mychart message from pt requesting lab and MD visit.  Pt stating she has noticed a difference in her facial structure and that she looks more frail than normal.  Scheduling message sent.

## 2023-03-11 ENCOUNTER — Other Ambulatory Visit: Payer: Self-pay | Admitting: *Deleted

## 2023-03-11 DIAGNOSIS — Z170 Estrogen receptor positive status [ER+]: Secondary | ICD-10-CM

## 2023-03-13 ENCOUNTER — Encounter: Payer: Self-pay | Admitting: Adult Health

## 2023-03-13 ENCOUNTER — Other Ambulatory Visit: Payer: Self-pay

## 2023-03-13 ENCOUNTER — Inpatient Hospital Stay: Payer: 59 | Admitting: Adult Health

## 2023-03-13 ENCOUNTER — Inpatient Hospital Stay: Payer: 59

## 2023-03-13 ENCOUNTER — Inpatient Hospital Stay: Payer: 59 | Attending: Oncology

## 2023-03-13 VITALS — BP 104/66 | HR 69 | Temp 97.8°F | Resp 16 | Ht 61.0 in | Wt 146.8 lb

## 2023-03-13 DIAGNOSIS — R350 Frequency of micturition: Secondary | ICD-10-CM | POA: Diagnosis not present

## 2023-03-13 DIAGNOSIS — Z79811 Long term (current) use of aromatase inhibitors: Secondary | ICD-10-CM | POA: Insufficient documentation

## 2023-03-13 DIAGNOSIS — Z170 Estrogen receptor positive status [ER+]: Secondary | ICD-10-CM

## 2023-03-13 DIAGNOSIS — R351 Nocturia: Secondary | ICD-10-CM

## 2023-03-13 DIAGNOSIS — R413 Other amnesia: Secondary | ICD-10-CM

## 2023-03-13 DIAGNOSIS — C50811 Malignant neoplasm of overlapping sites of right female breast: Secondary | ICD-10-CM

## 2023-03-13 LAB — CBC WITH DIFFERENTIAL (CANCER CENTER ONLY)
Abs Immature Granulocytes: 0 10*3/uL (ref 0.00–0.07)
Basophils Absolute: 0 10*3/uL (ref 0.0–0.1)
Basophils Relative: 1 %
Eosinophils Absolute: 0.1 10*3/uL (ref 0.0–0.5)
Eosinophils Relative: 1 %
HCT: 36.8 % (ref 36.0–46.0)
Hemoglobin: 12.9 g/dL (ref 12.0–15.0)
Immature Granulocytes: 0 %
Lymphocytes Relative: 53 %
Lymphs Abs: 2 10*3/uL (ref 0.7–4.0)
MCH: 30.6 pg (ref 26.0–34.0)
MCHC: 35.1 g/dL (ref 30.0–36.0)
MCV: 87.4 fL (ref 80.0–100.0)
Monocytes Absolute: 0.4 10*3/uL (ref 0.1–1.0)
Monocytes Relative: 9 %
Neutro Abs: 1.3 10*3/uL — ABNORMAL LOW (ref 1.7–7.7)
Neutrophils Relative %: 36 %
Platelet Count: 302 10*3/uL (ref 150–400)
RBC: 4.21 MIL/uL (ref 3.87–5.11)
RDW: 11.4 % — ABNORMAL LOW (ref 11.5–15.5)
WBC Count: 3.7 10*3/uL — ABNORMAL LOW (ref 4.0–10.5)
nRBC: 0 % (ref 0.0–0.2)

## 2023-03-13 LAB — URINALYSIS, COMPLETE (UACMP) WITH MICROSCOPIC
Bilirubin Urine: NEGATIVE
Glucose, UA: NEGATIVE mg/dL
Hgb urine dipstick: NEGATIVE
Ketones, ur: NEGATIVE mg/dL
Leukocytes,Ua: NEGATIVE
Nitrite: NEGATIVE
Protein, ur: NEGATIVE mg/dL
Specific Gravity, Urine: 1.02 (ref 1.005–1.030)
pH: 5 (ref 5.0–8.0)

## 2023-03-13 LAB — CMP (CANCER CENTER ONLY)
ALT: 17 U/L (ref 0–44)
AST: 23 U/L (ref 15–41)
Albumin: 4.3 g/dL (ref 3.5–5.0)
Alkaline Phosphatase: 103 U/L (ref 38–126)
Anion gap: 4 — ABNORMAL LOW (ref 5–15)
BUN: 13 mg/dL (ref 6–20)
CO2: 30 mmol/L (ref 22–32)
Calcium: 9.8 mg/dL (ref 8.9–10.3)
Chloride: 107 mmol/L (ref 98–111)
Creatinine: 0.96 mg/dL (ref 0.44–1.00)
GFR, Estimated: >60 mL/min (ref >60)
Glucose, Bld: 98 mg/dL (ref 70–99)
Potassium: 4.3 mmol/L (ref 3.5–5.1)
Sodium: 141 mmol/L (ref 135–145)
Total Bilirubin: 0.6 mg/dL (ref 0.3–1.2)
Total Protein: 7.2 g/dL (ref 6.5–8.1)

## 2023-03-13 LAB — MISCELLANEOUS TEST

## 2023-03-13 NOTE — Assessment & Plan Note (Signed)
[name] [name] is a 52 year old woman with history of stage Ia estrogen positive breast cancer status post right lumpectomy, adjuvant chemotherapy, adjuvant radiation, and continuing on tamoxifen.  1.  Stage Ia breast cancer: She is doing well.  She has no clinical or radiographic signs of breast cancer recurrence.  She will continue on anastrozole daily for now please see #2. 2.  Forgetfulness: This is an atypical and unusual side effect to be caused by the anastrozole.  We will obtain MRI of the brain to further evaluate. 3.  Urinary frequency in particular nocturia: Urinalysis and urine culture have been ordered she will leave [place] a sample today.  She is going to see urology in 4 weeks.  I counseled with Para March that her symptoms are atypical to be caused by anastrozole therefore we must rule out other etiologies.  We will obtain an MRI of the brain along with urinalysis and urine culture and we will see her back in 2 weeks to discuss further.  At that time we can discuss whether to stop the anastrozole or if an additional alternative etiology is found that is causing her concern.

## 2023-03-13 NOTE — Progress Notes (Signed)
[facility] Cancer Follow up:    [name], [name] Dance, MD [address] [place] [address]   DIAGNOSIS:  Cancer Staging  Malignant neoplasm of overlapping sites of right breast in female, estrogen receptor positive Staging form: Breast, AJCC 8th Edition - Clinical stage from 07/06/2020: Stage IA (cT1, cN0, cM0, G2, ER+, PR+, HER2-) - Unsigned Stage prefix: Initial diagnosis Histologic grading system: 3 grade system Laterality: Right Staged by: Pathologist and managing physician Stage used in treatment planning: Yes National guidelines used in treatment planning: Yes Type of national guideline used in treatment planning: NCCN - Pathologic stage from 07/28/2020: Stage IA (pT1c, pN0, cM0, G2, ER+, PR+, HER2-, Oncotype DX score: 28) - Signed by Loa Socks, NP on 08/17/2020 Stage prefix: Initial diagnosis Multigene prognostic tests performed: Oncotype DX Recurrence score range: Greater than or equal to 11 Histologic grading system: 3 grade system   SUMMARY OF ONCOLOGIC HISTORY: Oncology History  Malignant neoplasm of overlapping sites of right breast in female, estrogen receptor positive  07/01/2020 Initial Diagnosis   Malignant neoplasm of overlapping sites of right breast in female, estrogen receptor positive (HCC)   07/28/2020 Cancer Staging   Staging form: Breast, AJCC 8th Edition - Pathologic stage from 07/28/2020: Stage IA (pT1c, pN0, cM0, G2, ER+, PR+, HER2-, Oncotype DX score: 28)   07/28/2020 Surgery   Right lumpectomy Carolynne Edouard) [phone]): multifocal IDC, 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 Margin were negative. 4 lymph nodes were negative.   08/10/2020 Oncotype testing   The Oncotype DX score was 28 predicting a risk of outside the breast recurrence over the next 9 years of 17% if the patient's only systemic therapy is tamoxifen for 5 years.    09/02/2020 - 11/04/2020 Chemotherapy   adjuvant chemotherapy consisting of docetaxel and  cyclophosphamide given every 21 days x 4 started 09/02/2020, completed 11/04/2020 [facility])   11/30/2020 - 12/29/2020 Radiation Therapy   The patient initially received a dose of 40.05 Gy in 15 fractions to the breast using whole-breast tangent fields. This was delivered using a 3-D conformal technique. The pt received a boost delivering an additional 10 Gy in 5 fractions using a electron boost with electrons. The total dose was 50.05 Gy.   12/2020 -  Anti-estrogen oral therapy   Tamoxifen     CURRENT THERAPY: Anastrozole  INTERVAL HISTORY: [name] [name] 52 y.o. female returns for follow-up of her history of breast cancer.  She continues on anastrozole daily.  She notes that she has increased forgetfulness that has been worsening more recently.  For instance she forgot how to get to her husband's work the other day and she will forget something she had a conversation about with her daughter 15 minutes prior.  She underwent a bilateral breast diagnostic mammogram on Apr 18, 2022 that demonstrated no mammographic evidence of malignancy and breast density category C.    Patient Active Problem List   Diagnosis Date Noted   Chemotherapy-induced neuropathy 02/22/2022   Family history of ovarian cancer 07/08/2020   Family history of breast cancer 07/08/2020   Malignant neoplasm of overlapping sites of right breast in female, estrogen receptor positive 07/01/2020   Murmur 03/09/2014   Bradycardia 03/09/2014   Genital herpes     is allergic to keflex [cephalexin] and shellfish allergy.  MEDICAL HISTORY: Past Medical History:  Diagnosis Date   Asthma    Cancer    Family history of breast cancer 07/08/2020   Family history of ovarian cancer  07/08/2020   Genital herpes    History of radiation therapy 11/30/2020-12/29/2020   right breast   Dr Antony Blackbird    SURGICAL HISTORY: Past Surgical History:  Procedure Laterality Date   BREAST LUMPECTOMY WITH RADIOACTIVE SEED AND SENTINEL  LYMPH NODE BIOPSY Right 07/28/2020   Procedure: RIGHT BREAST LUMPECTOMY X 3  WITH RADIOACTIVE SEED AND SENTINEL LYMPH NODE BIOPSY;  Surgeon: Griselda Miner, MD;  Location: [facility] OR;  Service: General;  Laterality: Right;  PEC BLOCK   FOOT SURGERY Right    MOUTH SURGERY Right     SOCIAL HISTORY: Social History   Socioeconomic History   Marital status: Married    Spouse name: Not on file   Number of children: Not on file   Years of education: Not on file   Highest education level: Not on file  Occupational History   Not on file  Tobacco Use   Smoking status: Never   Smokeless tobacco: Never  Vaping Use   Vaping Use: Never used  Substance and Sexual Activity   Alcohol use: Yes    Comment: social   Drug use: No   Sexual activity: Yes  Other Topics Concern   Not on file  Social History Narrative   Not on file   Social Determinants of Health   Financial Resource Strain: Low Risk  (07/06/2020)   Overall Financial Resource Strain (CARDIA)    Difficulty of Paying Living Expenses: Not hard at all  Food Insecurity: No Food Insecurity (07/06/2020)   Hunger Vital Sign    Worried About Running Out of Food in the Last Year: Never true    Ran Out of Food in the Last Year: Never true  Transportation Needs: No Transportation Needs (07/06/2020)   PRAPARE - Administrator, Civil Service (Medical): No    Lack of Transportation (Non-Medical): No  Physical Activity: Not on file  Stress: Not on file  Social Connections: Not on file  Intimate Partner Violence: Not on file    FAMILY HISTORY: Family History  Problem Relation Age of Onset   Ovarian cancer Sister 29   CVA Other    Breast cancer Other        maternal grandmother's sister, dx 78s, d. 51s   Colon cancer Neg Hx    Colon polyps Neg Hx    Esophageal cancer Neg Hx    Rectal cancer Neg Hx    Stomach cancer Neg Hx     Review of Systems  Constitutional:  Negative for appetite change, chills, fatigue, fever and  unexpected weight change.  HENT:   Negative for hearing loss, lump/mass and trouble swallowing.   Eyes:  Negative for eye problems and icterus.  Respiratory:  Negative for chest tightness, cough and shortness of breath.   Cardiovascular:  Negative for chest pain, leg swelling and palpitations.  Gastrointestinal:  Negative for abdominal distention, abdominal pain, constipation, diarrhea, nausea and vomiting.  Endocrine: Negative for hot flashes.  Genitourinary:  Positive for nocturia. Negative for difficulty urinating.   Musculoskeletal:  Negative for arthralgias.  Skin:  Negative for itching and rash.  Neurological:  Negative for dizziness, extremity weakness, headaches and numbness.  Hematological:  Negative for adenopathy. Does not bruise/bleed easily.  Psychiatric/Behavioral:  Negative for depression. The patient is not nervous/anxious.       PHYSICAL EXAMINATION   Onc Performance Status - 03/13/23 0844       ECOG Perf Status   ECOG Perf Status Fully active, able to  carry on all pre-disease performance without restriction      KPS SCALE   KPS % SCORE Normal, no compliants, no evidence of disease             Vitals:   03/13/23 0829  BP: 104/66  Pulse: 69  Resp: 16  Temp: 97.8 F (36.6 C)  SpO2: 100%    Physical Exam Constitutional:      General: She is not in acute distress.    Appearance: Normal appearance. She is not toxic-appearing.  HENT:     Head: Normocephalic and atraumatic.  Eyes:     General: No scleral icterus. Cardiovascular:     Rate and Rhythm: Normal rate and regular rhythm.     Pulses: Normal pulses.     Heart sounds: Normal heart sounds.  Pulmonary:     Effort: Pulmonary effort is normal.     Breath sounds: Normal breath sounds.  Chest:     Comments: Right breast status postlumpectomy and radiation no sign of local recurrence left breast is benign. Abdominal:     General: Abdomen is flat. Bowel sounds are normal. There is no distension.      Palpations: Abdomen is soft.     Tenderness: There is no abdominal tenderness.  Musculoskeletal:        General: No swelling.     Cervical back: Neck supple.  Lymphadenopathy:     Cervical: No cervical adenopathy.  Skin:    General: Skin is warm and dry.     Findings: No rash.  Neurological:     General: No focal deficit present.     Mental Status: She is alert.  Psychiatric:        Mood and Affect: Mood normal.        Behavior: Behavior normal.     LABORATORY DATA:  CBC    Component Value Date/Time   WBC 3.7 (L) 03/13/2023 0745   WBC 4.0 11/21/2021 0757   RBC 4.21 03/13/2023 0745   HGB 12.9 03/13/2023 0745   HCT 36.8 03/13/2023 0745   PLT 302 03/13/2023 0745   MCV 87.4 03/13/2023 0745   MCH 30.6 03/13/2023 0745   MCHC 35.1 03/13/2023 0745   RDW 11.4 (L) 03/13/2023 0745   LYMPHSABS 2.0 03/13/2023 0745   MONOABS 0.4 03/13/2023 0745   EOSABS 0.1 03/13/2023 0745   BASOSABS 0.0 03/13/2023 0745    CMP     Component Value Date/Time   NA 141 03/13/2023 0745   K 4.3 03/13/2023 0745   CL 107 03/13/2023 0745   CO2 30 03/13/2023 0745   GLUCOSE 98 03/13/2023 0745   BUN 13 03/13/2023 0745   CREATININE 0.96 03/13/2023 0745   CALCIUM 9.8 03/13/2023 0745   PROT 7.2 03/13/2023 0745   ALBUMIN 4.3 03/13/2023 0745   AST 23 03/13/2023 0745   ALT 17 03/13/2023 0745   ALKPHOS 103 03/13/2023 0745   BILITOT 0.6 03/13/2023 0745   GFRNONAA >60 03/13/2023 0745   GFRAA >60 08/03/2020 1536       ASSESSMENT and THERAPY PLAN:   Malignant neoplasm of overlapping sites of right breast in female, estrogen receptor positive (HCC) Libbi is a 52 year old woman with history of stage Ia estrogen positive breast cancer status post right lumpectomy, adjuvant chemotherapy, adjuvant radiation, and continuing on tamoxifen.  1.  Stage Ia breast cancer: She is doing well.  She has no clinical or radiographic signs of breast cancer recurrence.  She will continue on anastrozole daily for  now  please see #2. 2.  Forgetfulness: This is an atypical and unusual side effect to be caused by the anastrozole.  We will obtain MRI of the brain to further evaluate. 3.  Urinary frequency in particular nocturia: Urinalysis and urine culture have been ordered she will leave [place] a sample today.  She is going to see urology in 4 weeks.  I counseled with Para March that her symptoms are atypical to be caused by anastrozole therefore we must rule out other etiologies.  We will obtain an MRI of the brain along with urinalysis and urine culture and we will see her back in 2 weeks to discuss further.  At that time we can discuss whether to stop the anastrozole or if an additional alternative etiology is found that is causing her concern.   All questions were answered. The patient knows to call the clinic with any problems, questions or concerns. We can certainly see the patient much sooner if necessary.  Total encounter time:20 minutes*in face-to-face visit time, chart review, lab review, care coordination, order entry, and documentation of the encounter time.    Lillard Anes, NP 03/13/23 9:52 AM Medical Oncology and Hematology [facility] [address] [place], [place] [address] Tel. [phone]    Fax. [phone]  *Total Encounter Time as defined by the Centers for Medicare and Medicaid Services includes, in addition to the face-to-face time of a patient visit (documented in the note above) non-face-to-face time: obtaining and reviewing outside history, ordering and reviewing medications, tests or procedures, care coordination (communications with other health care professionals or caregivers) and documentation in the medical record.

## 2023-03-14 ENCOUNTER — Telehealth: Payer: Self-pay | Admitting: Adult Health

## 2023-03-14 LAB — URINE CULTURE: Culture: NO GROWTH

## 2023-03-14 NOTE — Telephone Encounter (Signed)
Patient called to verify time and date of appointment. 

## 2023-03-14 NOTE — Telephone Encounter (Signed)
Scheduled appointment per 4/24 los. Left voicemail.

## 2023-03-15 ENCOUNTER — Telehealth: Payer: Self-pay

## 2023-03-15 NOTE — Telephone Encounter (Signed)
-----   Message from Loa Socks, NP sent at 03/14/2023  4:43 PM EDT ----- Urine negative, please notify patient ----- Message ----- From: Leory Plowman, Lab In [place] Sent: 03/13/2023  10:12 AM EDT To: Loa Socks, NP

## 2023-03-15 NOTE — Telephone Encounter (Signed)
Called and gave pt results per NP. She verbalized thanks and understanding.

## 2023-03-18 ENCOUNTER — Telehealth: Payer: Self-pay | Admitting: Adult Health

## 2023-03-18 NOTE — Telephone Encounter (Signed)
Rescheduled appointment per staff message. Left voicemail. 

## 2023-03-21 LAB — SIGNATERA
SIGNATERA MTM READOUT: 0 MTM/ml
SIGNATERA TEST RESULT: NEGATIVE

## 2023-03-22 NOTE — Telephone Encounter (Signed)
Attempted to call pt regarding signatera results lvm that results was negative and for pt to return call back if she had any concerns.

## 2023-03-24 ENCOUNTER — Encounter: Payer: Self-pay | Admitting: Hematology and Oncology

## 2023-03-25 ENCOUNTER — Encounter: Payer: Self-pay | Admitting: Adult Health

## 2023-03-26 ENCOUNTER — Encounter (HOSPITAL_COMMUNITY): Payer: Self-pay

## 2023-03-26 ENCOUNTER — Ambulatory Visit (HOSPITAL_COMMUNITY): Admission: RE | Admit: 2023-03-26 | Payer: 59 | Source: Ambulatory Visit

## 2023-03-28 ENCOUNTER — Encounter: Payer: Self-pay | Admitting: Adult Health

## 2023-03-28 ENCOUNTER — Telehealth: Payer: 59 | Admitting: Adult Health

## 2023-03-28 ENCOUNTER — Inpatient Hospital Stay: Payer: 59 | Attending: Oncology | Admitting: Adult Health

## 2023-03-28 DIAGNOSIS — C50811 Malignant neoplasm of overlapping sites of right female breast: Secondary | ICD-10-CM

## 2023-03-28 DIAGNOSIS — Z170 Estrogen receptor positive status [ER+]: Secondary | ICD-10-CM

## 2023-03-28 NOTE — Progress Notes (Signed)
Patient did not answer.  LMOM.  Can reschedule if needed.

## 2023-04-22 ENCOUNTER — Other Ambulatory Visit: Payer: Self-pay | Admitting: Hematology and Oncology

## 2023-04-22 ENCOUNTER — Ambulatory Visit
Admission: RE | Admit: 2023-04-22 | Discharge: 2023-04-22 | Disposition: A | Discharge status: Home / Self Care | Payer: 59 | Source: Ambulatory Visit | Attending: Hematology and Oncology | Admitting: Hematology and Oncology

## 2023-04-22 DIAGNOSIS — C50811 Malignant neoplasm of overlapping sites of right female breast: Secondary | ICD-10-CM

## 2023-04-22 DIAGNOSIS — Z780 Asymptomatic menopausal state: Secondary | ICD-10-CM

## 2023-04-23 ENCOUNTER — Telehealth: Payer: Self-pay

## 2023-04-23 MED ORDER — VITAMIN D 25 MCG (1000 UNIT) PO TABS: 1000.0000 [IU] | ORAL_TABLET | Freq: Every day | ORAL | 3 refills | Status: AC

## 2023-04-23 NOTE — Telephone Encounter (Signed)
Called and given below message, Pt verbalized understanding.  

## 2023-04-23 NOTE — Telephone Encounter (Signed)
-----   Message from Loa Socks, NP sent at 04/22/2023  1:36 PM EDT ----- Bone density shows osteopenia.  Recommend continued calicum, vitamind, weight bearing exercises, and repeat testing in 2 years time ----- Message ----- From: Interface, Rad Results In Sent: 04/22/2023   8:20 AM EDT To: Serena Croissant, MD

## 2023-05-06 ENCOUNTER — Encounter: Payer: Self-pay | Admitting: Hematology and Oncology

## 2023-05-14 ENCOUNTER — Telehealth: Payer: Self-pay | Admitting: Hematology and Oncology

## 2023-05-14 NOTE — Telephone Encounter (Signed)
Called patient twice, also contact patient contacts, left a message regarding appointments and also sent appointment reminder.

## 2023-05-15 ENCOUNTER — Telehealth: Payer: Self-pay | Admitting: Hematology and Oncology

## 2023-06-06 ENCOUNTER — Encounter: Payer: Self-pay | Admitting: Hematology and Oncology

## 2023-06-12 ENCOUNTER — Encounter: Payer: Self-pay | Admitting: Adult Health

## 2023-06-12 ENCOUNTER — Inpatient Hospital Stay: Payer: 59 | Admitting: Adult Health

## 2023-06-12 ENCOUNTER — Telehealth: Payer: Self-pay

## 2023-06-12 ENCOUNTER — Inpatient Hospital Stay: Payer: 59 | Attending: Oncology

## 2023-06-12 ENCOUNTER — Inpatient Hospital Stay: Payer: 59

## 2023-06-12 ENCOUNTER — Other Ambulatory Visit: Payer: Self-pay

## 2023-06-12 VITALS — BP 124/82 | HR 69 | Temp 97.7°F | Resp 18 | Ht 61.0 in | Wt 147.6 lb

## 2023-06-12 DIAGNOSIS — C50811 Malignant neoplasm of overlapping sites of right female breast: Secondary | ICD-10-CM | POA: Insufficient documentation

## 2023-06-12 DIAGNOSIS — Z170 Estrogen receptor positive status [ER+]: Secondary | ICD-10-CM | POA: Insufficient documentation

## 2023-06-12 DIAGNOSIS — R350 Frequency of micturition: Secondary | ICD-10-CM

## 2023-06-12 DIAGNOSIS — R351 Nocturia: Secondary | ICD-10-CM

## 2023-06-12 DIAGNOSIS — Z79811 Long term (current) use of aromatase inhibitors: Secondary | ICD-10-CM | POA: Insufficient documentation

## 2023-06-12 LAB — URINALYSIS, COMPLETE (UACMP) WITH MICROSCOPIC
Bacteria, UA: NONE SEEN
Bilirubin Urine: NEGATIVE
Glucose, UA: NEGATIVE mg/dL
Hgb urine dipstick: NEGATIVE
Ketones, ur: NEGATIVE mg/dL
Leukocytes,Ua: NEGATIVE
Nitrite: NEGATIVE
Protein, ur: NEGATIVE mg/dL
Specific Gravity, Urine: 1.017 (ref 1.005–1.030)
pH: 6 (ref 5.0–8.0)

## 2023-06-12 LAB — GENETIC SCREENING ORDER

## 2023-06-12 MED ORDER — CLOTRIMAZOLE-BETAMETHASONE 1-0.05 % EX CREA
1.0000 | TOPICAL_CREAM | Freq: Two times a day (BID) | CUTANEOUS | 0 refills | Status: AC
Start: 2023-06-12 — End: ?

## 2023-06-12 NOTE — Telephone Encounter (Signed)
LM regarding the below results.  Informed pt to call with any questions or concerns.

## 2023-06-12 NOTE — Assessment & Plan Note (Signed)
[name] [name] is a 52 year old woman with history of stage Ia estrogen positive breast cancer status post right lumpectomy, adjuvant chemotherapy, adjuvant radiation, and continuing on Anastrozole.  1.  Stage Ia breast cancer: She continues on Anastrozole daily and will continue this.  Her next mammogram is due 03/2024. Seh continues to undergo signatera testing every 3 months as well.   2.  Right breast lesion: with its circular appearance it could be fungal in nature.  I sent in Lotrisone cream for her to apply.  The rash is not consistent with a classic recurrence, and initially I suggested trying the cream and if the area doesn't improve, she could see her surgeon to explore a skin biopsy of the area.  [name] [name] would prefer to forego any cream for now and get to the bottom of the rash etiology with surgery.  She has an appointment with Hedda Slade on Monday at [facility].    [name] [name] will RTC every 3 months for labs and in 6 months she has f/u with Dr. Pamelia Hoit.

## 2023-06-12 NOTE — Telephone Encounter (Signed)
-----   Message from Noreene Filbert sent at 06/12/2023  9:01 AM EDT ----- Urine looks good, please let Jaziah know.  The urine culture will prob take a couple days to result ----- Message ----- From: Interface, Lab In [place] Sent: 06/12/2023   8:38 AM EDT To: Loa Socks, NP

## 2023-06-12 NOTE — Progress Notes (Signed)
[facility] Cancer Follow up:    [name], [name] Dance, MD [address] [place] [address]   DIAGNOSIS:  Cancer Staging  Malignant neoplasm of overlapping sites of right breast in female, estrogen receptor positive (HCC) Staging form: Breast, AJCC 8th Edition - Clinical stage from 07/06/2020: Stage IA (cT1, cN0, cM0, G2, ER+, PR+, HER2-) - Unsigned Stage prefix: Initial diagnosis Histologic grading system: 3 grade system Laterality: Right Staged by: Pathologist and managing physician Stage used in treatment planning: Yes National guidelines used in treatment planning: Yes Type of national guideline used in treatment planning: NCCN - Pathologic stage from 07/28/2020: Stage IA (pT1c, pN0, cM0, G2, ER+, PR+, HER2-, Oncotype DX score: 28) - Signed by Loa Socks, NP on 08/17/2020 Stage prefix: Initial diagnosis Multigene prognostic tests performed: Oncotype DX Recurrence score range: Greater than or equal to 11 Histologic grading system: 3 grade system   SUMMARY OF ONCOLOGIC HISTORY: Oncology History  Malignant neoplasm of overlapping sites of right breast in female, estrogen receptor positive (HCC)  07/01/2020 Initial Diagnosis   Malignant neoplasm of overlapping sites of right breast in female, estrogen receptor positive (HCC)   07/28/2020 Cancer Staging   Staging form: Breast, AJCC 8th Edition - Pathologic stage from 07/28/2020: Stage IA (pT1c, pN0, cM0, G2, ER+, PR+, HER2-, Oncotype DX score: 28)   07/28/2020 Surgery   Right lumpectomy Carolynne Edouard) [phone]): multifocal IDC, 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 Margin were negative. 4 lymph nodes were negative.   08/10/2020 Oncotype testing   The Oncotype DX score was 28 predicting a risk of outside the breast recurrence over the next 9 years of 17% if the patient's only systemic therapy is tamoxifen for 5 years.    09/02/2020 - 11/04/2020 Chemotherapy   adjuvant chemotherapy consisting of  docetaxel and cyclophosphamide given every 21 days x 4 started 09/02/2020, completed 11/04/2020 [facility])   11/30/2020 - 12/29/2020 Radiation Therapy   The patient initially received a dose of 40.05 Gy in 15 fractions to the breast using whole-breast tangent fields. This was delivered using a 3-D conformal technique. The pt received a boost delivering an additional 10 Gy in 5 fractions using a electron boost with electrons. The total dose was 50.05 Gy.   12/2020 -  Anti-estrogen oral therapy   Tamoxifen     CURRENT THERAPY: Anastrozole  INTERVAL HISTORY: [name] [name] 52 y.o. female returns for f/u of a red spot that popped up about a week ago on her right breast.  She is concerned about and wants someone to look at it.  She denies any other new issues and continues on Anastrozole daily with good tolerance.  Her most recent mammogram occurred on Apr 19, 2023 at [facility].  It demonstrated no suspicious mass or calcifications in either breast with lumpectomy changes noted in the outer central right breast and surgical clips noted in the right axilla.  Her breast composition was heterogeneously dense which is consistent with breast density category C.   Patient Active Problem List   Diagnosis Date Noted   Chemotherapy-induced neuropathy (HCC) 02/22/2022   Family history of ovarian cancer 07/08/2020   Family history of breast cancer 07/08/2020   Malignant neoplasm of overlapping sites of right breast in female, estrogen receptor positive (HCC) 07/01/2020   Murmur 03/09/2014   Bradycardia 03/09/2014   Genital herpes     is allergic to keflex [cephalexin] and shellfish allergy.  MEDICAL HISTORY: Past Medical History:  Diagnosis Date  Asthma    Cancer (HCC)    Family history of breast cancer 07/08/2020   Family history of ovarian cancer 07/08/2020   Genital herpes    History of radiation therapy 11/30/2020-12/29/2020   right breast   Dr Antony Blackbird    SURGICAL  HISTORY: Past Surgical History:  Procedure Laterality Date   BREAST LUMPECTOMY WITH RADIOACTIVE SEED AND SENTINEL LYMPH NODE BIOPSY Right 07/28/2020   Procedure: RIGHT BREAST LUMPECTOMY X 3  WITH RADIOACTIVE SEED AND SENTINEL LYMPH NODE BIOPSY;  Surgeon: Griselda Miner, MD;  Location: [facility] OR;  Service: General;  Laterality: Right;  PEC BLOCK   FOOT SURGERY Right    MOUTH SURGERY Right     SOCIAL HISTORY: Social History   Socioeconomic History   Marital status: Married    Spouse name: Not on file   Number of children: Not on file   Years of education: Not on file   Highest education level: Not on file  Occupational History   Not on file  Tobacco Use   Smoking status: Never   Smokeless tobacco: Never  Vaping Use   Vaping status: Never Used  Substance and Sexual Activity   Alcohol use: Yes    Comment: social   Drug use: No   Sexual activity: Yes  Other Topics Concern   Not on file  Social History Narrative   Not on file   Social Determinants of Health   Financial Resource Strain: Low Risk  (07/06/2020)   Overall Financial Resource Strain (CARDIA)    Difficulty of Paying Living Expenses: Not hard at all  Food Insecurity: No Food Insecurity (07/06/2020)   Hunger Vital Sign    Worried About Running Out of Food in the Last Year: Never true    Ran Out of Food in the Last Year: Never true  Transportation Needs: No Transportation Needs (07/06/2020)   PRAPARE - Administrator, Civil Service (Medical): No    Lack of Transportation (Non-Medical): No  Physical Activity: Not on file  Stress: Not on file  Social Connections: Not on file  Intimate Partner Violence: Not on file    FAMILY HISTORY: Family History  Problem Relation Age of Onset   Ovarian cancer Sister 77   CVA Other    Breast cancer Other        maternal grandmother's sister, dx 59s, d. 26s   Colon cancer Neg Hx    Colon polyps Neg Hx    Esophageal cancer Neg Hx    Rectal cancer Neg Hx    Stomach  cancer Neg Hx     Review of Systems  Constitutional:  Negative for appetite change, chills, fatigue, fever and unexpected weight change.  HENT:   Negative for hearing loss, lump/mass and trouble swallowing.   Eyes:  Negative for eye problems and icterus.  Respiratory:  Negative for chest tightness, cough and shortness of breath.   Cardiovascular:  Negative for chest pain, leg swelling and palpitations.  Gastrointestinal:  Negative for abdominal distention, abdominal pain, constipation, diarrhea, nausea and vomiting.  Endocrine: Negative for hot flashes.  Genitourinary:  Negative for difficulty urinating.   Musculoskeletal:  Negative for arthralgias.  Skin:  Negative for itching and rash.  Neurological:  Negative for dizziness, extremity weakness, headaches and numbness.  Hematological:  Negative for adenopathy. Does not bruise/bleed easily.  Psychiatric/Behavioral:  Negative for depression. The patient is not nervous/anxious.       PHYSICAL EXAMINATION    Vitals:   06/12/23  0824  BP: 124/82  Pulse: 69  Resp: 18  Temp: 97.7 F (36.5 C)  SpO2: 100%    Physical Exam Chest:     Comments: Right breast with a small 1cm well circumscribed, slightly raised erythematous circular rash with central pallor and dryness.  Bilateral axillae are benign, there is no mass, nodule, or sign of recurrence in the right breast, the left breast is benign         ASSESSMENT and THERAPY PLAN:   Malignant neoplasm of overlapping sites of right breast in female, estrogen receptor positive (HCC) [name] [name] is a 52 year old woman with history of stage Ia estrogen positive breast cancer status post right lumpectomy, adjuvant chemotherapy, adjuvant radiation, and continuing on Anastrozole.  1.  Stage Ia breast cancer: She continues on Anastrozole daily and will continue this.  Her next mammogram is due 03/2024. Seh continues to undergo signatera testing every 3 months as well.   2.  Right breast  lesion: with its circular appearance it could be fungal in nature.  I sent in Lotrisone cream for her to apply.  The rash is not consistent with a classic recurrence, and initially I suggested trying the cream and if the area doesn't improve, she could see her surgeon to explore a skin biopsy of the area.  [name] [name] would prefer to forego any cream for now and get to the bottom of the rash etiology with surgery.  She has an appointment with Hedda Slade on Monday at [facility].    [name] [name] will RTC every 3 months for labs and in 6 months she has f/u with Dr. Pamelia Hoit.     All questions were answered. The patient knows to call the clinic with any problems, questions or concerns. We can certainly see the patient much sooner if necessary.  Total encounter time:20 minutes*in face-to-face visit time, chart review, lab review, care coordination, order entry, and documentation of the encounter time.    Lillard Anes, NP 06/12/23 8:59 AM Medical Oncology and Hematology [facility] [address] [place], [place] [address] Tel. [phone]    Fax. [phone]  *Total Encounter Time as defined by the Centers for Medicare and Medicaid Services includes, in addition to the face-to-face time of a patient visit (documented in the note above) non-face-to-face time: obtaining and reviewing outside history, ordering and reviewing medications, tests or procedures, care coordination (communications with other health care professionals or caregivers) and documentation in the medical record.

## 2023-06-13 LAB — URINE CULTURE: Culture: 40000 — AB

## 2023-06-16 ENCOUNTER — Encounter: Payer: Self-pay | Admitting: Adult Health

## 2023-06-17 ENCOUNTER — Telehealth: Payer: Self-pay | Admitting: Adult Health

## 2023-06-17 NOTE — Telephone Encounter (Signed)
-----   Message from Noreene Filbert sent at 06/13/2023  3:47 PM EDT ----- Zada Finders let patient know that in light of her not having any symptoms and her urinalysis being negative this growth is likely colonization on her skin.  If she has any concerns or has recently seen urology we can always send these results over to them or her primary care. ----- Message ----- From: Interface, Lab In [place] Sent: 06/12/2023   8:38 AM EDT To: Loa Socks, NP

## 2023-06-17 NOTE — Telephone Encounter (Signed)
Per Lillard Anes, DNP, called pt with message below. Pt verbalized understanding and will also hydrate as well. Advised to call if there are further concerns

## 2023-06-20 ENCOUNTER — Encounter: Payer: Self-pay | Admitting: Hematology and Oncology

## 2023-07-03 NOTE — Telephone Encounter (Signed)
Attempted to call pt regarding signatera results lvm for pt that results were negative and signatera will be out in 3-6 months to repeat labs.

## 2023-09-02 ENCOUNTER — Encounter: Payer: Self-pay | Admitting: Hematology and Oncology

## 2023-09-11 ENCOUNTER — Other Ambulatory Visit: Payer: Self-pay

## 2023-09-11 ENCOUNTER — Inpatient Hospital Stay: Payer: 59 | Attending: Oncology

## 2023-09-11 DIAGNOSIS — C50811 Malignant neoplasm of overlapping sites of right female breast: Secondary | ICD-10-CM | POA: Diagnosis present

## 2023-09-11 DIAGNOSIS — R350 Frequency of micturition: Secondary | ICD-10-CM | POA: Diagnosis not present

## 2023-09-11 DIAGNOSIS — Z170 Estrogen receptor positive status [ER+]: Secondary | ICD-10-CM

## 2023-09-11 LAB — URINALYSIS, COMPLETE (UACMP) WITH MICROSCOPIC
Bilirubin Urine: NEGATIVE
Glucose, UA: NEGATIVE mg/dL
Hgb urine dipstick: NEGATIVE
Ketones, ur: NEGATIVE mg/dL
Nitrite: NEGATIVE
Protein, ur: NEGATIVE mg/dL
Specific Gravity, Urine: 1.017 (ref 1.005–1.030)
pH: 6 (ref 5.0–8.0)

## 2023-09-11 LAB — GENETIC SCREENING ORDER

## 2023-09-12 ENCOUNTER — Encounter: Payer: Self-pay | Admitting: Adult Health

## 2023-09-12 ENCOUNTER — Inpatient Hospital Stay: Payer: 59

## 2023-09-12 LAB — URINE CULTURE: Culture: NO GROWTH

## 2023-09-13 ENCOUNTER — Other Ambulatory Visit: Payer: Self-pay | Admitting: *Deleted

## 2023-09-13 DIAGNOSIS — R350 Frequency of micturition: Secondary | ICD-10-CM

## 2023-09-13 NOTE — Progress Notes (Signed)
Per NP in light of pt not having any symptoms and her urinalysis being negative this growth is likely colonization on her skin. Pt needing to see Urology. RN successfully faxed referral to [facility] at [phone].

## 2023-09-16 ENCOUNTER — Encounter: Payer: Self-pay | Admitting: Adult Health

## 2023-09-18 LAB — SIGNATERA
SIGNATERA MTM READOUT: 0 MTM/ml
SIGNATERA TEST RESULT: NEGATIVE

## 2023-10-22 ENCOUNTER — Encounter: Payer: Self-pay | Admitting: Hematology and Oncology

## 2023-10-23 ENCOUNTER — Other Ambulatory Visit: Payer: Self-pay | Admitting: *Deleted

## 2023-10-23 DIAGNOSIS — Z170 Estrogen receptor positive status [ER+]: Secondary | ICD-10-CM

## 2023-10-25 ENCOUNTER — Inpatient Hospital Stay: Payer: 59 | Attending: Oncology

## 2023-10-25 ENCOUNTER — Inpatient Hospital Stay: Payer: 59 | Attending: Oncology | Admitting: Adult Health

## 2023-10-25 VITALS — BP 116/72 | HR 65 | Temp 98.1°F | Resp 18 | Ht 61.0 in | Wt 149.9 lb

## 2023-10-25 DIAGNOSIS — Z79811 Long term (current) use of aromatase inhibitors: Secondary | ICD-10-CM | POA: Insufficient documentation

## 2023-10-25 DIAGNOSIS — C50811 Malignant neoplasm of overlapping sites of right female breast: Secondary | ICD-10-CM | POA: Diagnosis not present

## 2023-10-25 DIAGNOSIS — Z170 Estrogen receptor positive status [ER+]: Secondary | ICD-10-CM

## 2023-10-25 DIAGNOSIS — R42 Dizziness and giddiness: Secondary | ICD-10-CM | POA: Diagnosis not present

## 2023-10-25 LAB — CMP (CANCER CENTER ONLY)
ALT: 15 U/L (ref 0–44)
AST: 23 U/L (ref 15–41)
Albumin: 4.4 g/dL (ref 3.5–5.0)
Alkaline Phosphatase: 95 U/L (ref 38–126)
Anion gap: 6 (ref 5–15)
BUN: 14 mg/dL (ref 6–20)
CO2: 29 mmol/L (ref 22–32)
Calcium: 9.5 mg/dL (ref 8.9–10.3)
Chloride: 107 mmol/L (ref 98–111)
Creatinine: 0.93 mg/dL (ref 0.44–1.00)
GFR, Estimated: >60 mL/min (ref >60)
Glucose, Bld: 82 mg/dL (ref 70–99)
Potassium: 3.5 mmol/L (ref 3.5–5.1)
Sodium: 142 mmol/L (ref 135–145)
Total Bilirubin: 0.5 mg/dL (ref <1.2)
Total Protein: 7 g/dL (ref 6.5–8.1)

## 2023-10-25 LAB — CBC WITH DIFFERENTIAL (CANCER CENTER ONLY)
Abs Immature Granulocytes: 0 10*3/uL (ref 0.00–0.07)
Basophils Absolute: 0 10*3/uL (ref 0.0–0.1)
Basophils Relative: 1 %
Eosinophils Absolute: 0 10*3/uL (ref 0.0–0.5)
Eosinophils Relative: 1 %
HCT: 38.2 % (ref 36.0–46.0)
Hemoglobin: 13.1 g/dL (ref 12.0–15.0)
Immature Granulocytes: 0 %
Lymphocytes Relative: 49 %
Lymphs Abs: 2.1 10*3/uL (ref 0.7–4.0)
MCH: 30.4 pg (ref 26.0–34.0)
MCHC: 34.3 g/dL (ref 30.0–36.0)
MCV: 88.6 fL (ref 80.0–100.0)
Monocytes Absolute: 0.4 10*3/uL (ref 0.1–1.0)
Monocytes Relative: 9 %
Neutro Abs: 1.6 10*3/uL — ABNORMAL LOW (ref 1.7–7.7)
Neutrophils Relative %: 40 %
Platelet Count: 276 10*3/uL (ref 150–400)
RBC: 4.31 MIL/uL (ref 3.87–5.11)
RDW: 11.5 % (ref 11.5–15.5)
WBC Count: 4.1 10*3/uL (ref 4.0–10.5)
nRBC: 0 % (ref 0.0–0.2)

## 2023-10-25 NOTE — Progress Notes (Unsigned)
[facility] Cancer Follow up:    [name], [name] Dance, MD [address] [place] [address]   DIAGNOSIS: Cancer Staging  Malignant neoplasm of overlapping sites of right breast in female, estrogen receptor positive (HCC) Staging form: Breast, AJCC 8th Edition - Clinical stage from 07/06/2020: Stage IA (cT1, cN0, cM0, G2, ER+, PR+, HER2-) - Unsigned Stage prefix: Initial diagnosis Histologic grading system: 3 grade system Laterality: Right Staged by: Pathologist and managing physician Stage used in treatment planning: Yes National guidelines used in treatment planning: Yes Type of national guideline used in treatment planning: NCCN - Pathologic stage from 07/28/2020: Stage IA (pT1c, pN0, cM0, G2, ER+, PR+, HER2-, Oncotype DX score: 28) - Signed by Loa Socks, NP on 08/17/2020 Stage prefix: Initial diagnosis Multigene prognostic tests performed: Oncotype DX Recurrence score range: Greater than or equal to 11 Histologic grading system: 3 grade system   SUMMARY OF ONCOLOGIC HISTORY: Oncology History  Malignant neoplasm of overlapping sites of right breast in female, estrogen receptor positive (HCC)  07/01/2020 Initial Diagnosis   Malignant neoplasm of overlapping 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 Margin were negative. 4 lymph nodes were negative.   08/10/2020 Oncotype testing   The Oncotype DX score was 28 predicting a risk of outside the breast recurrence over the next 9 years of 17% if the patient's only systemic therapy is tamoxifen for 5 years.    09/02/2020 - 11/04/2020 Chemotherapy   adjuvant chemotherapy consisting of  docetaxel and cyclophosphamide given every 21 days x 4 started 09/02/2020, completed 11/04/2020 [facility])   11/30/2020 - 12/29/2020 Radiation Therapy   The patient initially received a dose of 40.05 Gy in 15 fractions to the breast using whole-breast tangent fields. This was delivered using a 3-D conformal technique. The pt received a boost delivering an additional 10 Gy in 5 fractions using a electron boost with electrons. The total dose was 50.05 Gy.   12/2020 -  Anti-estrogen oral therapy   Tamoxifen     CURRENT THERAPY:   INTERVAL HISTORY: [name] [name] 52 y.o. 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  MEDICAL HISTORY: Past Medical History:  Diagnosis Date   Asthma    Cancer [facility])    Family history of breast cancer 07/08/2020   Family history of ovarian cancer 07/08/2020   Genital herpes    History of radiation therapy 11/30/2020-12/29/2020   right breast   Dr Antony Blackbird    SURGICAL HISTORY: Past Surgical History:  Procedure Laterality Date   BREAST LUMPECTOMY WITH RADIOACTIVE SEED AND SENTINEL LYMPH NODE BIOPSY Right 07/28/2020   Procedure: RIGHT BREAST LUMPECTOMY X 3  WITH RADIOACTIVE SEED AND SENTINEL LYMPH NODE BIOPSY;  Surgeon: Griselda Miner, MD;  Location: [facility] OR;  Service: General;  Laterality: Right;  PEC BLOCK   FOOT SURGERY Right    MOUTH SURGERY Right     SOCIAL HISTORY: Social History   Socioeconomic History   Marital status: Married    Spouse name: Not on file   Number of children: Not on file   Years of education: Not on file   Highest  education level: Not on file  Occupational History   Not on file  Tobacco Use   Smoking status: Never   Smokeless tobacco: Never  Vaping Use   Vaping status: Never Used  Substance and Sexual Activity   Alcohol use: Yes    Comment: social   Drug use: No   Sexual activity: Yes  Other Topics Concern   Not on file  Social History Narrative   Not on file   Social Determinants of Health   Financial Resource Strain: Low Risk  (07/06/2020)   Overall Financial Resource Strain (CARDIA)    Difficulty of Paying Living Expenses: Not hard at all  Food Insecurity: No Food Insecurity (07/06/2020)   Hunger Vital Sign    Worried About Running Out of Food in the Last Year: Never true    Ran Out of Food in the Last Year: Never true  Transportation Needs: No Transportation Needs (07/06/2020)   PRAPARE - Administrator, Civil Service (Medical): No    Lack of Transportation (Non-Medical): No  Physical Activity: Not on file  Stress: Not on file  Social Connections: Not on file  Intimate Partner Violence: Not on file    FAMILY HISTORY: Family History  Problem Relation Age of Onset   Ovarian cancer Sister 66   CVA Other    Breast cancer Other        maternal grandmother's sister, dx 57s, d. 71s   Colon cancer Neg Hx    Colon polyps Neg Hx    Esophageal cancer Neg Hx    Rectal cancer Neg Hx    Stomach cancer Neg Hx     Review of Systems - Oncology    PHYSICAL EXAMINATION   Onc Performance Status - 10/25/23 1115       KPS SCALE   KPS % SCORE Normal, no compliants, no evidence of disease             Vitals:   10/25/23 1111  BP: 116/72  Pulse: 65  Resp: 18  Temp: 98.1 F (36.7 C)  SpO2: 100%    Physical Exam  LABORATORY DATA:  CBC    Component Value Date/Time   WBC 4.1 10/25/2023 1028   WBC 4.0 11/21/2021 0757   RBC 4.31 10/25/2023 1028   HGB 13.1 10/25/2023 1028   HCT 38.2 10/25/2023 1028   PLT 276 10/25/2023 1028   MCV 88.6 10/25/2023 1028   MCH  30.4 10/25/2023 1028   MCHC 34.3 10/25/2023 1028   RDW 11.5 10/25/2023 1028   LYMPHSABS 2.1 10/25/2023 1028   MONOABS 0.4 10/25/2023 1028   EOSABS 0.0 10/25/2023 1028   BASOSABS 0.0 10/25/2023 1028    CMP     Component Value Date/Time   NA 142 10/25/2023 1028   K 3.5 10/25/2023 1028   CL 107 10/25/2023 1028   CO2 29 10/25/2023 1028   GLUCOSE 82 10/25/2023 1028   BUN 14 10/25/2023 1028   CREATININE 0.93 10/25/2023 1028   CALCIUM 9.5 10/25/2023 1028   PROT 7.0 10/25/2023 1028   ALBUMIN 4.4 10/25/2023 1028   AST 23 10/25/2023 1028   ALT  15 10/25/2023 1028   ALKPHOS 95 10/25/2023 1028   BILITOT 0.5 10/25/2023 1028   GFRNONAA >60 10/25/2023 1028   GFRAA >60 08/03/2020 1536       PENDING LABS:   RADIOGRAPHIC STUDIES:  No results found.   PATHOLOGY:     ASSESSMENT and THERAPY PLAN:   No problem-specific Assessment & Plan notes found for this encounter.   No orders of the defined types were placed in this encounter.   All questions were answered. The patient knows to call the clinic with any problems, questions or concerns. We can certainly see the patient much sooner if necessary. This note was electronically signed. Noreene Filbert, NP 10/25/2023

## 2023-10-29 NOTE — Assessment & Plan Note (Signed)
Dizziness and Numbness New onset, intermittent, and non-persistent. No associated symptoms suggestive of metastatic disease. Labs including complete blood count, electrolytes, kidney and liver function tests were normal. -Consider obtaining a glucometer to check blood sugar during episodes of dizziness. -If symptoms persist or become more frequent, consider further evaluation including imaging.  Breast Changes Patient noted changes in breast veins. No signs of recurrence or metastatic disease on recent imaging. Radiation treatment can cause vein changes. -Continue self-breast examination and report any new or worsening changes. -Continue Anastrozole as prescribed.  Supplement Use Patient taking over-the-counter supplement "[place] Choice" for hair, nail, and skin health. No known contraindications or interactions with current treatment. -Continue [place] Choice as tolerated.  General Health Maintenance -Continue regular mammograms and follow-up appointments as scheduled.

## 2023-10-31 ENCOUNTER — Encounter: Payer: Self-pay | Admitting: Adult Health

## 2023-11-24 ENCOUNTER — Encounter: Payer: Self-pay | Admitting: Hematology and Oncology

## 2023-11-25 ENCOUNTER — Other Ambulatory Visit: Payer: Self-pay

## 2023-11-25 ENCOUNTER — Inpatient Hospital Stay: Payer: 59 | Attending: Oncology

## 2023-11-25 ENCOUNTER — Inpatient Hospital Stay: Payer: 59 | Admitting: Hematology and Oncology

## 2023-11-25 VITALS — BP 125/76 | HR 75 | Temp 97.6°F | Resp 18 | Ht 61.0 in | Wt 150.8 lb

## 2023-11-25 DIAGNOSIS — Z170 Estrogen receptor positive status [ER+]: Secondary | ICD-10-CM | POA: Diagnosis not present

## 2023-11-25 DIAGNOSIS — C50811 Malignant neoplasm of overlapping sites of right female breast: Secondary | ICD-10-CM

## 2023-11-25 DIAGNOSIS — Z79811 Long term (current) use of aromatase inhibitors: Secondary | ICD-10-CM | POA: Diagnosis not present

## 2023-11-25 DIAGNOSIS — R109 Unspecified abdominal pain: Secondary | ICD-10-CM | POA: Diagnosis not present

## 2023-11-25 DIAGNOSIS — R748 Abnormal levels of other serum enzymes: Secondary | ICD-10-CM | POA: Insufficient documentation

## 2023-11-25 DIAGNOSIS — R350 Frequency of micturition: Secondary | ICD-10-CM

## 2023-11-25 DIAGNOSIS — M8580 Other specified disorders of bone density and structure, unspecified site: Secondary | ICD-10-CM | POA: Insufficient documentation

## 2023-11-25 LAB — URINALYSIS, COMPLETE (UACMP) WITH MICROSCOPIC
Bacteria, UA: NONE SEEN
Bilirubin Urine: NEGATIVE
Glucose, UA: NEGATIVE mg/dL
Hgb urine dipstick: NEGATIVE
Ketones, ur: NEGATIVE mg/dL
Leukocytes,Ua: NEGATIVE
Nitrite: NEGATIVE
Protein, ur: NEGATIVE mg/dL
Specific Gravity, Urine: 1.023 (ref 1.005–1.030)
pH: 5 (ref 5.0–8.0)

## 2023-11-25 LAB — GENETIC SCREENING ORDER

## 2023-11-25 NOTE — Assessment & Plan Note (Signed)
 07/01/2020: history of stage Ia estrogen positive breast cancer status post right lumpectomy, adjuvant chemotherapy (TC x 4), adjuvant radiation (completed 12/29/20), and continuing on tamoxifen  (since 12/2020), switched to anastrozole  and then switched to letrozole  [facility])   Antiestrogen therapy: She is currently on anastrozole  therapy. We briefly discussed exemestane.  She will read more about it and will inform us  if she wants to switch to it   Breast Cancer Surveillance: 1. Breast Exam: 11/25/2023: Benign 2. Mammograms 05/20/2023: [facility]: Benign 3. Bone Density: 04/18/21: T score -1.5: Osteopenia 4.  Signatera testing: Negative   CT CAP 01/10/22: Benign CT Head 12/07/21: Neg MRI brain 06/18/2023: [facility]: No infarct   Letrozole  toxicities: Vaginal dryness: She will try to use over-the-counter Replens or coconut oil. Hair thinning   Return to clinic in 1 year for follow-up

## 2023-11-25 NOTE — Progress Notes (Signed)
 Patient Care Team: [name], Richerd SAUNDERS, MD as PCP - General (Family Medicine) Curvin Deward MOULD, MD as Consulting Physician (General Surgery) Shannon Agent, MD as Consulting Physician (Radiation Oncology) Marilynn Nest, DO as Consulting Physician (Obstetrics and Gynecology) Marquita Comer Free, MD as Referring Physician (Hematology and Oncology) Crawford Morna Pickle, NP as Nurse Practitioner (Hematology and Oncology)  DIAGNOSIS:  Encounter Diagnosis  Name Primary?   Malignant neoplasm of overlapping sites of right breast in female, estrogen receptor positive (HCC) Yes    SUMMARY OF ONCOLOGIC HISTORY: Oncology History  Malignant neoplasm of overlapping sites of right breast in female, estrogen receptor positive (HCC)  07/01/2020 Initial Diagnosis   Malignant neoplasm of overlapping sites of right breast in female, estrogen receptor positive (HCC)   07/28/2020 Cancer Staging   Staging form: Breast, AJCC 8th Edition - Pathologic stage from 07/28/2020: Stage IA (pT1c, pN0, cM0, G2, ER+, PR+, HER2-, Oncotype DX score: 28)   07/28/2020 Surgery   Right lumpectomy Osker) [phone]): multifocal IDC, 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 Margin were negative. 4 lymph nodes were negative.   08/10/2020 Oncotype testing   The Oncotype DX score was 28 predicting a risk of outside the breast recurrence over the next 9 years of 17% if the patient's only systemic therapy is tamoxifen  for 5 years.    09/02/2020 - 11/04/2020 Chemotherapy   adjuvant chemotherapy consisting of docetaxel and cyclophosphamide given every 21 days x 4 started 09/02/2020, completed 11/04/2020 [facility])   11/30/2020 - 12/29/2020 Radiation Therapy   The patient initially received a dose of 40.05 Gy in 15 fractions to the breast using whole-breast tangent fields. This was delivered using a 3-D conformal technique. The pt received a boost delivering an additional 10 Gy in 5 fractions using a electron boost with  electrons. The total dose was 50.05 Gy.   12/2020 -  Anti-estrogen oral therapy   Tamoxifen      CHIEF COMPLIANT:   HISTORY OF PRESENT ILLNESS:   History of Present Illness   The patient, with a history of breast cancer has been getting regular Signatera testing, presents with recent episodes of disorientation and unsteadiness. These episodes, which started about three weeks ago, are described as feeling off and are accompanied by numbness in the hands and arms. The patient reports one particularly disturbing episode that occurred in the middle of the night, during which the numbness seemed to spread to the legs. The patient also reports a sharp pain in the left side of the abdomen after eating, which has been occurring recently. The patient's husband has recently had high liver enzyme levels on blood tests, but the patient has not experienced any similar symptoms.         ALLERGIES:  is allergic to keflex [cephalexin] and shellfish allergy.  MEDICATIONS:  Current Outpatient Medications  Medication Sig Dispense Refill   anastrozole  (ARIMIDEX ) 1 MG tablet TAKE 1 TABLET BY MOUTH EVERY DAY 90 tablet 2   cholecalciferol (VITAMIN D3) 25 MCG (1000 UNIT) tablet Take 1 tablet (1,000 Units total) by mouth daily. 90 tablet 3   clotrimazole -betamethasone  (LOTRISONE ) cream Apply 1 Application topically 2 (two) times daily. 30 g 0   ibuprofen (ADVIL) 200 MG tablet Take 200-400 mg by mouth every 6 (six) hours as needed for headache or moderate pain.      MISC NATURAL PRODUCTS PO Take by mouth. The [place] Choice (Hair, skin, nail supplement)     OVER THE COUNTER MEDICATION as needed. Tumeric liquid  Tetrahydrozoline HCl (VISINE OP) Place 1 drop into both eyes daily as needed (redness).      No current facility-administered medications for this visit.    PHYSICAL EXAMINATION: ECOG PERFORMANCE STATUS: 1 - Symptomatic but completely ambulatory  Vitals:   11/25/23 0819  BP: 125/76  Pulse: 75   Resp: 18  Temp: 97.6 F (36.4 C)  SpO2: 100%   Filed Weights   11/25/23 0819  Weight: 150 lb 12.8 oz (68.4 kg)      LABORATORY DATA:  I have reviewed the data as listed    Latest Ref Rng & Units 10/25/2023   10:28 AM 03/13/2023    7:45 AM 12/12/2022    7:47 AM  CMP  Glucose 70 - 99 mg/dL 82  98  96   BUN 6 - 20 mg/dL 14  13  15    Creatinine 0.44 - 1.00 mg/dL 9.06  9.03  9.02   Sodium 135 - 145 mmol/L 142  141  142   Potassium 3.5 - 5.1 mmol/L 3.5  4.3  3.8   Chloride 98 - 111 mmol/L 107  107  108   CO2 22 - 32 mmol/L 29  30  27    Calcium 8.9 - 10.3 mg/dL 9.5  9.8  9.6   Total Protein 6.5 - 8.1 g/dL 7.0  7.2  7.3   Total Bilirubin <1.2 mg/dL 0.5  0.6  0.6   Alkaline Phos 38 - 126 U/L 95  103  102   AST 15 - 41 U/L 23  23  25    ALT 0 - 44 U/L 15  17  20      Lab Results  Component Value Date   WBC 4.1 10/25/2023   HGB 13.1 10/25/2023   HCT 38.2 10/25/2023   MCV 88.6 10/25/2023   PLT 276 10/25/2023   NEUTROABS 1.6 (L) 10/25/2023    ASSESSMENT & PLAN:  Malignant neoplasm of overlapping sites of right breast in female, estrogen receptor positive (HCC) 07/01/2020: history of stage Ia estrogen positive breast cancer status post right lumpectomy, adjuvant chemotherapy (TC x 4), adjuvant radiation (completed 12/29/20), and was on tamoxifen  (since 12/2020), switched to anastrozole  and then switched to letrozole  [facility])   Antiestrogen therapy: She is currently on anastrozole  therapy. We briefly discussed exemestane.  She will read more about it and will inform us  if she wants to switch to it   Breast Cancer Surveillance: 1. Breast Exam: 11/25/2023: Benign 2. Mammograms 05/20/2023: [facility]: Benign 3. Bone Density: 04/18/21: T score -1.5: Osteopenia 4.  Signatera testing: Negative   CT CAP 01/10/22: Benign CT Head 12/07/21: Neg MRI brain 06/18/2023: [facility]: No infarct   Letrozole  toxicities: Vaginal dryness: She will try to use over-the-counter Replens or  coconut oil. Hair thinning      Episodes of Dizziness and Numbness New onset, intermittent episodes of dizziness and numbness in hands. Episodes last approximately 15 minutes and are associated with nausea and unsteadiness. No clear triggers identified. No associated shortness of breath. -Advise patient to check blood pressure, heart rate, and pulse oximetry during episodes. -Order full body scan for further evaluation.  Abdominal Pain New onset, left-sided abdominal pain after eating. No other associated symptoms reported. -Order abdominal ultrasound for further evaluation.  Elevated Liver Enzymes Recent blood work showed elevated liver enzymes. Patient reports no changes in alcohol consumption or medication use. No jaundice noted. -Order hepatitis B and C testing. -Order liver ultrasound for further evaluation. -Repeat liver function tests.  General  Health Maintenance -Continue Signatera testing every six months for early detection of breast cancer recurrence. -Plan to follow up with patient in 1-2 weeks after scan results are available.          Orders Placed This Encounter  Procedures   CT CHEST ABDOMEN PELVIS W CONTRAST    Standing Status:   Future    Expected Date:   12/09/2023    Expiration Date:   11/24/2024    If indicated for the ordered procedure, I authorize the administration of contrast media per Radiology protocol:   Yes    Does the patient have a contrast media/X-ray dye allergy?:   No    Is patient pregnant?:   No    Preferred imaging location?:   [facility]    Release to patient:   Immediate    If indicated for the ordered procedure, I authorize the administration of oral contrast media per Radiology protocol:   Yes   The patient has a good understanding of the overall plan. she agrees with it. she will call with any problems that may develop before the next visit here. Total time spent: 30 mins including face to face time and time spent for  planning, charting and co-ordination of care   Naomi MARLA Chad, MD 11/25/23

## 2023-11-26 LAB — URINE CULTURE: Culture: 20000 — AB

## 2023-11-30 LAB — SIGNATERA
SIGNATERA MTM READOUT: 0 MTM/ml
SIGNATERA TEST RESULT: NEGATIVE

## 2023-12-06 ENCOUNTER — Telehealth: Payer: Self-pay

## 2023-12-06 NOTE — Telephone Encounter (Signed)
Attempted to call pt regarding signatera negative  results lvm for pt to return call back if she has any questions or concerns.

## 2023-12-06 NOTE — Telephone Encounter (Deleted)
 Called pt per MD to advise Signatera testing was negative/not detected. Pt verbalized understanding of results and knows [facility] Nail will be in touch to schedule 6 mo repeat lab.

## 2023-12-06 NOTE — Telephone Encounter (Signed)
Open in error

## 2023-12-09 ENCOUNTER — Ambulatory Visit (HOSPITAL_COMMUNITY)
Admission: RE | Admit: 2023-12-09 | Discharge: 2023-12-09 | Disposition: A | Discharge status: Home / Self Care | Payer: 59 | Source: Ambulatory Visit | Attending: Hematology and Oncology | Admitting: Hematology and Oncology

## 2023-12-09 DIAGNOSIS — C50811 Malignant neoplasm of overlapping sites of right female breast: Secondary | ICD-10-CM | POA: Insufficient documentation

## 2023-12-09 DIAGNOSIS — Z170 Estrogen receptor positive status [ER+]: Secondary | ICD-10-CM | POA: Insufficient documentation

## 2023-12-09 MED ORDER — IOHEXOL 300 MG/ML  SOLN
100.0000 mL | Freq: Once | INTRAMUSCULAR | Status: AC | PRN
  Administered 2023-12-09: 100 mL via INTRAVENOUS

## 2023-12-11 ENCOUNTER — Inpatient Hospital Stay: Payer: 59

## 2023-12-11 LAB — POCT I-STAT CREATININE: Creatinine, Ser: 1 mg/dL (ref 0.44–1.00)

## 2023-12-13 ENCOUNTER — Inpatient Hospital Stay: Payer: 59

## 2023-12-17 ENCOUNTER — Inpatient Hospital Stay (HOSPITAL_BASED_OUTPATIENT_CLINIC_OR_DEPARTMENT_OTHER): Payer: 59 | Admitting: Hematology and Oncology

## 2023-12-17 DIAGNOSIS — Z170 Estrogen receptor positive status [ER+]: Secondary | ICD-10-CM

## 2023-12-17 DIAGNOSIS — C50811 Malignant neoplasm of overlapping sites of right female breast: Secondary | ICD-10-CM | POA: Diagnosis not present

## 2023-12-17 NOTE — Progress Notes (Signed)
HEMATOLOGY-ONCOLOGY TELEPHONE VISIT PROGRESS NOTE  I connected with our patient on 12/17/23 at 10:00 AM EST by telephone and verified that I am speaking with the correct person using two identifiers.  I discussed the limitations, risks, security and privacy concerns of performing an evaluation and management service by telephone and the availability of in person appointments.  I also discussed with the patient that there may be a patient responsible charge related to this service. The patient expressed understanding and agreed to proceed.   History of Present Illness:    History of Present Illness   The patient presents with abdominal pain had CT scans and is connecting by phone to discuss the results. The CT was neg for metastatic disease. It showed gallstones and uterine fibroid.  She experiences abdominal pain, described as sometimes sharp and not cramping. The pain is not localized to a specific area, and she is unsure if it is related to her known gallstones or uterine fibroid.  A recent CT scan showed the presence of gallstones and a fibroid on the uterus, with no other abnormalities noted.  She has a lab appointment scheduled for March 31st for Signatera testing, which she prefers to have done at the center rather than at home.        Oncology History  Malignant neoplasm of overlapping sites of right breast in female, estrogen receptor positive (HCC)  07/01/2020 Initial Diagnosis   Malignant neoplasm of overlapping sites of right breast in female, estrogen receptor positive (HCC)   07/28/2020 Cancer Staging   Staging form: Breast, AJCC 8th Edition - Pathologic stage from 07/28/2020: Stage IA (pT1c, pN0, cM0, G2, ER+, PR+, HER2-, Oncotype DX score: 28)   07/28/2020 Surgery   Right lumpectomy Carolynne Edouard) [phone]): multifocal IDC, 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 Margin were negative. 4 lymph nodes were negative.   08/10/2020 Oncotype testing   The Oncotype DX score was 28  predicting a risk of outside the breast recurrence over the next 9 years of 17% if the patient's only systemic therapy is tamoxifen for 5 years.    09/02/2020 - 11/04/2020 Chemotherapy   adjuvant chemotherapy consisting of docetaxel and cyclophosphamide given every 21 days x 4 started 09/02/2020, completed 11/04/2020 [facility])   11/30/2020 - 12/29/2020 Radiation Therapy   The patient initially received a dose of 40.05 Gy in 15 fractions to the breast using whole-breast tangent fields. This was delivered using a 3-D conformal technique. The pt received a boost delivering an additional 10 Gy in 5 fractions using a electron boost with electrons. The total dose was 50.05 Gy.   12/2020 -  Anti-estrogen oral therapy   Tamoxifen     REVIEW OF SYSTEMS:   Constitutional: Denies fevers, chills or abnormal weight loss All other systems were reviewed with the patient and are negative. Observations/Objective:     Assessment Plan:  Malignant neoplasm of overlapping sites of right breast in female, estrogen receptor positive (HCC) 07/01/2020: history of stage Ia estrogen positive breast cancer status post right lumpectomy, adjuvant chemotherapy (TC x 4), adjuvant radiation (completed 12/29/20), and was on tamoxifen (since 12/2020), switched to anastrozole and then switched to letrozole [facility])   Antiestrogen therapy: She is currently on anastrozole therapy. We briefly discussed exemestane.  She will read more about it and will inform us if she wants to switch to it   Breast Cancer Surveillance: 1. Breast Exam: 11/25/2023: Benign 2. Mammograms 05/20/2023: [facility]: Benign 3. Bone Density: 04/18/21: T score -1.5:  Osteopenia 4.  Signatera testing: Negative (she likes to do this testing at the cancer center because she does not want anyone to come to her home.)   CT CAP 01/10/22: Benign CT Head 12/07/21: Neg MRI brain 06/18/2023: [facility]: No infarct   Letrozole toxicities: Vaginal  dryness: She will try to use over-the-counter Replens or coconut oil. Hair thinning  CT CAP 12/10/2023: No evidence of metastatic disease.  Cholelithiasis and uterine leiomyomas --------------------------------- Assessment and Plan    Abdominal Pain Patient reports intermittent sharp abdominal pain. CT scan revealed gallstones and a uterine fibroid, but no other abnormalities. Gallbladder pain could present as dyspepsia, bloating, and nausea, while fibroid pain would be more pelvic in nature. -No immediate intervention required. Monitor symptoms.  Gallstones Asymptomatic gallstones identified on CT scan. -No intervention required unless symptoms develop.  Uterine Fibroid Asymptomatic uterine fibroid identified on CT scan. -No intervention required unless symptoms develop.  General Health Maintenance / Followup Plans -Keep lab appointment on February 17, 2024, likely for Signatera testing. -Schedule follow-up appointment in one year.          I discussed the assessment and treatment plan with the patient. The patient was provided an opportunity to ask questions and all were answered. The patient agreed with the plan and demonstrated an understanding of the instructions. The patient was advised to call back or seek an in-person evaluation if the symptoms worsen or if the condition fails to improve as anticipated.   I provided 20 minutes of non-face-to-face time during this encounter.  This includes time for charting and coordination of care   Tamsen Meek, MD

## 2023-12-17 NOTE — Assessment & Plan Note (Signed)
07/01/2020: history of stage Ia estrogen positive breast cancer status post right lumpectomy, adjuvant chemotherapy (TC x 4), adjuvant radiation (completed 12/29/20), and was on tamoxifen (since 12/2020), switched to anastrozole and then switched to letrozole [facility])   Antiestrogen therapy: She is currently on anastrozole therapy. We briefly discussed exemestane.  She will read more about it and will inform us if she wants to switch to it   Breast Cancer Surveillance: 1. Breast Exam: 11/25/2023: Benign 2. Mammograms 05/20/2023: [facility]: Benign 3. Bone Density: 04/18/21: T score -1.5: Osteopenia 4.  Signatera testing: Negative   CT CAP 01/10/22: Benign CT Head 12/07/21: Neg MRI brain 06/18/2023: [facility]: No infarct   Letrozole toxicities: Vaginal dryness: She will try to use over-the-counter Replens or coconut oil. Hair thinning  CT CAP 12/10/2023: No evidence of metastatic disease.  Cholelithiasis and uterine leiomyomas

## 2024-01-11 ENCOUNTER — Other Ambulatory Visit: Payer: Self-pay | Admitting: Hematology and Oncology

## 2024-02-07 ENCOUNTER — Other Ambulatory Visit: Payer: Self-pay | Admitting: Hematology and Oncology

## 2024-02-17 ENCOUNTER — Inpatient Hospital Stay: Payer: Self-pay | Attending: Oncology

## 2024-02-17 LAB — GENETIC SCREENING ORDER

## 2024-02-21 ENCOUNTER — Telehealth: Payer: Self-pay | Admitting: Hematology and Oncology

## 2024-02-21 NOTE — Telephone Encounter (Signed)
 Left vm about scheduled appt date and time.

## 2024-02-25 ENCOUNTER — Telehealth: Payer: Self-pay | Admitting: Hematology and Oncology

## 2024-02-25 NOTE — Telephone Encounter (Signed)
 Left pt vm about scheduled appts date and time.

## 2024-02-26 ENCOUNTER — Other Ambulatory Visit: Payer: Self-pay | Admitting: *Deleted

## 2024-02-26 DIAGNOSIS — Z170 Estrogen receptor positive status [ER+]: Secondary | ICD-10-CM

## 2024-02-26 DIAGNOSIS — C50811 Malignant neoplasm of overlapping sites of right female breast: Secondary | ICD-10-CM

## 2024-03-03 ENCOUNTER — Encounter: Payer: Self-pay | Admitting: Hematology and Oncology

## 2024-03-05 ENCOUNTER — Encounter: Payer: Self-pay | Admitting: Hematology and Oncology

## 2024-03-06 ENCOUNTER — Telehealth: Payer: Self-pay

## 2024-03-06 NOTE — Telephone Encounter (Signed)
 Attempted to call pt regarding signatera results lvm that signatera results was negative and for pt to return call back with any questions or concerns.

## 2024-03-18 ENCOUNTER — Other Ambulatory Visit: Payer: Self-pay

## 2024-06-24 ENCOUNTER — Telehealth: Payer: Self-pay | Admitting: *Deleted

## 2024-06-24 NOTE — Telephone Encounter (Signed)
 Received call from pt with complaint of dizziness, swollen lymph nodes in her neck as well as numbness and tingling in bilateral hands.  Pt states she was seen by PCP yesterday who suggested she was coming down with a virus.  Pt requesting advice from office and possibly scheduling a [facility] visit for evaluation of swollen lymph nodes.  RN educated pt rest and increase fluid intake to help with possible cold virus.  Pt also scheduled for [facility] visit in 2 weeks for further evaluation if needed.  Pt knows to contact the office and cancel appt if she starts to feel better.

## 2024-06-26 ENCOUNTER — Encounter: Payer: Self-pay | Admitting: Adult Health

## 2024-06-26 NOTE — Telephone Encounter (Signed)
 Can this patient please be triaged?  Thanks, Morna

## 2024-06-26 NOTE — Telephone Encounter (Signed)
 Called pt and lvm about the concerns that she had with the tingling I her hands and swelling I fingers. Advise pt to return call back so that she can be triage and scheduled to come in.

## 2024-07-07 ENCOUNTER — Encounter: Payer: Self-pay | Admitting: Adult Health

## 2024-07-07 ENCOUNTER — Inpatient Hospital Stay

## 2024-07-07 ENCOUNTER — Inpatient Hospital Stay: Payer: Self-pay | Attending: Adult Health | Admitting: Adult Health

## 2024-07-07 VITALS — BP 114/64 | HR 72 | Temp 98.7°F | Resp 18 | Ht 61.0 in | Wt 151.3 lb

## 2024-07-07 DIAGNOSIS — C50811 Malignant neoplasm of overlapping sites of right female breast: Secondary | ICD-10-CM | POA: Insufficient documentation

## 2024-07-07 DIAGNOSIS — R202 Paresthesia of skin: Secondary | ICD-10-CM | POA: Insufficient documentation

## 2024-07-07 DIAGNOSIS — Z170 Estrogen receptor positive status [ER+]: Secondary | ICD-10-CM | POA: Diagnosis not present

## 2024-07-07 DIAGNOSIS — Z79811 Long term (current) use of aromatase inhibitors: Secondary | ICD-10-CM | POA: Insufficient documentation

## 2024-07-07 DIAGNOSIS — E559 Vitamin D deficiency, unspecified: Secondary | ICD-10-CM

## 2024-07-07 DIAGNOSIS — G6289 Other specified polyneuropathies: Secondary | ICD-10-CM | POA: Diagnosis not present

## 2024-07-07 DIAGNOSIS — M8588 Other specified disorders of bone density and structure, other site: Secondary | ICD-10-CM | POA: Insufficient documentation

## 2024-07-07 LAB — IRON AND IRON BINDING CAPACITY (CC-WL,HP ONLY)
Iron: 98 ug/dL (ref 28–170)
Saturation Ratios: 37 % — ABNORMAL HIGH (ref 10.4–31.8)
TIBC: 266 ug/dL (ref 250–450)
UIBC: 168 ug/dL (ref 148–442)

## 2024-07-07 LAB — CMP (CANCER CENTER ONLY)
ALT: 16 U/L (ref 0–44)
AST: 22 U/L (ref 15–41)
Albumin: 4.4 g/dL (ref 3.5–5.0)
Alkaline Phosphatase: 107 U/L (ref 38–126)
Anion gap: 6 (ref 5–15)
BUN: 11 mg/dL (ref 6–20)
CO2: 29 mmol/L (ref 22–32)
Calcium: 9.8 mg/dL (ref 8.9–10.3)
Chloride: 106 mmol/L (ref 98–111)
Creatinine: 0.81 mg/dL (ref 0.44–1.00)
GFR, Estimated: >60 mL/min (ref >60)
Glucose, Bld: 89 mg/dL (ref 70–99)
Potassium: 4 mmol/L (ref 3.5–5.1)
Sodium: 141 mmol/L (ref 135–145)
Total Bilirubin: 0.6 mg/dL (ref 0.0–1.2)
Total Protein: 7.2 g/dL (ref 6.5–8.1)

## 2024-07-07 LAB — SEDIMENTATION RATE: Sed Rate: 2 mm/h (ref 0–22)

## 2024-07-07 LAB — CBC WITH DIFFERENTIAL (CANCER CENTER ONLY)
Abs Immature Granulocytes: 0.01 K/uL (ref 0.00–0.07)
Basophils Absolute: 0.1 K/uL (ref 0.0–0.1)
Basophils Relative: 1 %
Eosinophils Absolute: 0.1 K/uL (ref 0.0–0.5)
Eosinophils Relative: 1 %
HCT: 34.9 % — ABNORMAL LOW (ref 36.0–46.0)
Hemoglobin: 11.8 g/dL — ABNORMAL LOW (ref 12.0–15.0)
Immature Granulocytes: 0 %
Lymphocytes Relative: 52 %
Lymphs Abs: 2.1 K/uL (ref 0.7–4.0)
MCH: 30 pg (ref 26.0–34.0)
MCHC: 33.8 g/dL (ref 30.0–36.0)
MCV: 88.8 fL (ref 80.0–100.0)
Monocytes Absolute: 0.4 K/uL (ref 0.1–1.0)
Monocytes Relative: 11 %
Neutro Abs: 1.4 K/uL — ABNORMAL LOW (ref 1.7–7.7)
Neutrophils Relative %: 35 %
Platelet Count: 366 K/uL (ref 150–400)
RBC: 3.93 MIL/uL (ref 3.87–5.11)
RDW: 12.2 % (ref 11.5–15.5)
WBC Count: 4 K/uL (ref 4.0–10.5)
nRBC: 0 % (ref 0.0–0.2)

## 2024-07-07 LAB — FERRITIN: Ferritin: 135 ng/mL (ref 11–307)

## 2024-07-07 LAB — VITAMIN D 25 HYDROXY (VIT D DEFICIENCY, FRACTURES): Vit D, 25-Hydroxy: 37.17 ng/mL (ref 30–100)

## 2024-07-07 LAB — C-REACTIVE PROTEIN: CRP: 0.5 mg/dL (ref <1.0)

## 2024-07-07 LAB — MAGNESIUM: Magnesium: 2.2 mg/dL (ref 1.7–2.4)

## 2024-07-07 LAB — VITAMIN B12: Vitamin B-12: 483 pg/mL (ref 180–914)

## 2024-07-07 NOTE — Progress Notes (Signed)
 [facility] Cancer Follow up:    [name] Planas, MD [address] [place] [address]   DIAGNOSIS: Cancer Staging  Malignant neoplasm of overlapping sites of right breast in female, estrogen receptor positive (HCC) Staging form: Breast, AJCC 8th Edition - Clinical stage from 07/06/2020: Stage IA (cT1, cN0, cM0, G2, ER+, PR+, HER2-) - Unsigned Stage prefix: Initial diagnosis Histologic grading system: 3 grade system Laterality: Right Staged by: Pathologist and managing physician Stage used in treatment planning: Yes National guidelines used in treatment planning: Yes Type of national guideline used in treatment planning: NCCN - Pathologic stage from 07/28/2020: Stage IA (pT1c, pN0, cM0, G2, ER+, PR+, HER2-, Oncotype DX score: 28) - Signed by Crawford Morna Pickle, NP on 08/17/2020 Stage prefix: Initial diagnosis Multigene prognostic tests performed: Oncotype DX Recurrence score range: Greater than or equal to 11 Histologic grading system: 3 grade system    SUMMARY OF ONCOLOGIC HISTORY: Oncology History  Malignant neoplasm of overlapping sites of right breast in female, estrogen receptor positive (HCC)  07/01/2020 Initial Diagnosis   Malignant neoplasm of overlapping sites of right breast in female, estrogen receptor positive (HCC)   07/28/2020 Cancer Staging   Staging form: Breast, AJCC 8th Edition - Pathologic stage from 07/28/2020: Stage IA (pT1c, pN0, cM0, G2, ER+, PR+, HER2-, Oncotype DX score: 28)   07/28/2020 Surgery   Right lumpectomy [facility]) [phone]): multifocal IDC, 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 Margin were negative. 4 lymph nodes were negative.   08/10/2020 Oncotype testing   The Oncotype DX score was 28 predicting a risk of outside the breast recurrence over the next 9 years of 17% if the patient's only systemic therapy is tamoxifen  for 5 years.    09/02/2020 - 11/04/2020 Chemotherapy   adjuvant chemotherapy consisting of docetaxel  and cyclophosphamide given every 21 days x 4 started 09/02/2020, completed 11/04/2020 [facility])   11/30/2020 - 12/29/2020 Radiation Therapy   The patient initially received a dose of 40.05 Gy in 15 fractions to the breast using whole-breast tangent fields. This was delivered using a 3-D conformal technique. The pt received a boost delivering an additional 10 Gy in 5 fractions using a electron boost with electrons. The total dose was 50.05 Gy.   12/2020 -  Anti-estrogen oral therapy   Tamoxifen      CURRENT THERAPY:  INTERVAL HISTORY:  Discussed the use of AI scribe software for clinical note transcription with the patient, who gave verbal consent to proceed.  [name] [name] 53 y.o. 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  MEDICAL HISTORY: Past Medical History:  Diagnosis Date   Asthma    Cancer [facility])    Family history of breast cancer 07/08/2020   Family history of ovarian cancer 07/08/2020   Genital herpes    History of radiation therapy 11/30/2020-12/29/2020   right breast   Dr Lynwood Nasuti    SURGICAL HISTORY: Past Surgical History:  Procedure Laterality Date   BREAST LUMPECTOMY WITH RADIOACTIVE SEED AND SENTINEL LYMPH NODE BIOPSY Right 07/28/2020   Procedure: RIGHT BREAST LUMPECTOMY X 3  WITH RADIOACTIVE SEED AND SENTINEL LYMPH NODE BIOPSY;  Surgeon: Curvin Deward MOULD, MD;  Location: [facility] OR;  Service: General;  Laterality: Right;  PEC BLOCK   FOOT SURGERY Right    MOUTH SURGERY Right     SOCIAL HISTORY: Social History   Socioeconomic History   Marital  status: Married    Spouse name: Not on file   Number of children: Not on file   Years of education: Not on file   Highest education level: Not on file  Occupational History   Not on file  Tobacco Use   Smoking status: Never   Smokeless tobacco: Never  Vaping Use   Vaping status: Never Used  Substance and Sexual Activity   Alcohol use: Yes    Comment: social   Drug use: No   Sexual activity: Yes  Other Topics Concern   Not on file  Social History Narrative   Not on file   Social Drivers of Health   Financial Resource Strain: Low Risk  (07/06/2020)   Overall Financial Resource Strain (CARDIA)    Difficulty of Paying Living Expenses: Not hard at all  Food Insecurity: No Food Insecurity (07/06/2020)   Hunger Vital Sign    Worried About Running Out of Food in the Last Year: Never true    Ran Out of Food in the Last Year: Never true  Transportation Needs: No Transportation Needs (07/06/2020)   PRAPARE - Administrator, Civil Service (Medical): No    Lack of Transportation (Non-Medical): No  Physical Activity: Not on file  Stress: Not on file  Social Connections: Not on file  Intimate Partner Violence: Not on file    FAMILY HISTORY: Family History  Problem Relation Age of Onset   Ovarian cancer Sister 53   CVA Other    Breast cancer Other        maternal grandmother's sister, dx 27s, d. 39s   Colon cancer Neg Hx    Colon polyps Neg Hx    Esophageal cancer Neg Hx    Rectal cancer Neg Hx    Stomach cancer Neg Hx     Review of Systems - Oncology    PHYSICAL EXAMINATION   Onc Performance Status - 07/07/24 1100       KPS SCALE   KPS % SCORE Able to carry on normal activity, minor s/s of disease          Vitals:   07/07/24 1132  BP: 114/64  Pulse: 72  Resp: 18  Temp: 98.7 F (37.1 C)  SpO2: 100%    Physical Exam  LABORATORY DATA:  CBC    Component Value Date/Time   WBC 4.1 10/25/2023 1028   WBC 4.0 11/21/2021 0757   RBC 4.31  10/25/2023 1028   HGB 13.1 10/25/2023 1028   HCT 38.2 10/25/2023 1028   PLT 276 10/25/2023 1028   MCV 88.6 10/25/2023 1028   MCH 30.4 10/25/2023 1028   MCHC 34.3 10/25/2023 1028   RDW 11.5 10/25/2023 1028   LYMPHSABS 2.1 10/25/2023 1028   MONOABS 0.4 10/25/2023 1028   EOSABS 0.0 10/25/2023 1028   BASOSABS 0.0 10/25/2023 1028    CMP     Component Value Date/Time   NA 142 10/25/2023 1028   K 3.5 10/25/2023 1028   CL 107 10/25/2023 1028   CO2 29 10/25/2023 1028   GLUCOSE 82 10/25/2023 1028   BUN 14 10/25/2023 1028   CREATININE 1.00 12/09/2023 1704   CREATININE 0.93 10/25/2023  1028   CALCIUM 9.5 10/25/2023 1028   PROT 7.0 10/25/2023 1028   ALBUMIN 4.4 10/25/2023 1028   AST 23 10/25/2023 1028   ALT 15 10/25/2023 1028   ALKPHOS 95 10/25/2023 1028   BILITOT 0.5 10/25/2023 1028   GFRNONAA >60 10/25/2023 1028   GFRAA >60 08/03/2020 1536     ASSESSMENT and THERAPY PLAN:   No problem-specific Assessment & Plan notes found for this encounter.     All questions were answered. The patient knows to call the clinic with any problems, questions or concerns. We can certainly see the patient much sooner if necessary.  Total encounter time:*** minutes*in face-to-face visit time, chart review, lab review, care coordination, order entry, and documentation of the encounter time.    Morna Kendall, NP 07/07/24 12:00 PM Medical Oncology and Hematology [facility] [address] [place], [place] [address] Tel. [phone]    Fax. [phone]  *Total Encounter Time as defined by the Centers for Medicare and Medicaid Services includes, in addition to the face-to-face time of a patient visit (documented in the note above) non-face-to-face time: obtaining and reviewing outside history, ordering and reviewing medications, tests or procedures, care coordination (communications with other health care professionals or caregivers) and documentation in the medical record.

## 2024-07-08 LAB — ANTINUCLEAR ANTIBODIES, IFA: ANA Ab, IFA: NEGATIVE

## 2024-07-09 ENCOUNTER — Encounter: Payer: Self-pay | Admitting: Adult Health

## 2024-07-09 LAB — PROTEIN ELECTROPHORESIS, SERUM, WITH REFLEX
A/G Ratio: 1.3 (ref 0.7–1.7)
Albumin ELP: 3.9 g/dL (ref 2.9–4.4)
Alpha-1-Globulin: 0.2 g/dL (ref 0.0–0.4)
Alpha-2-Globulin: 0.6 g/dL (ref 0.4–1.0)
Beta Globulin: 0.9 g/dL (ref 0.7–1.3)
Gamma Globulin: 1.3 g/dL (ref 0.4–1.8)
Globulin, Total: 3 g/dL (ref 2.2–3.9)
Total Protein ELP: 6.9 g/dL (ref 6.0–8.5)

## 2024-07-10 ENCOUNTER — Encounter: Payer: Self-pay | Admitting: Adult Health

## 2024-07-10 ENCOUNTER — Other Ambulatory Visit: Payer: Self-pay | Admitting: *Deleted

## 2024-07-10 DIAGNOSIS — Z170 Estrogen receptor positive status [ER+]: Secondary | ICD-10-CM

## 2024-07-10 NOTE — Assessment & Plan Note (Signed)
 07/01/2020: history of stage Ia estrogen positive breast cancer status post right lumpectomy, adjuvant chemotherapy (TC x 4), adjuvant radiation (completed 12/29/20), and was on tamoxifen  (since 12/2020), switched to anastrozole  and then switched to letrozole  [facility])   Antiestrogen therapy: She is currently on anastrozole  therapy.  CT CAP 01/10/22: Benign CT Head 12/07/21: Neg MRI brain 06/18/2023: [facility]: No infarct CT CAP 12/10/2023: No evidence of metastatic disease.  Cholelithiasis and uterine leiomyomas  Breast Cancer Surveillance: 1. Breast Exam: 11/25/2023: Benign 2. Mammograms 06/08/2024: [facility], benign, breast density C 3. Bone Density: 04/22/2023: T score -1.6: Osteopenia 4.  Signatera testing: Negative 11/2023

## 2024-08-02 ENCOUNTER — Other Ambulatory Visit: Payer: Self-pay | Admitting: Hematology and Oncology

## 2024-08-03 NOTE — Telephone Encounter (Signed)
 Refilled per last office note.

## 2024-08-18 ENCOUNTER — Inpatient Hospital Stay: Payer: Self-pay | Attending: Adult Health

## 2024-08-18 ENCOUNTER — Other Ambulatory Visit: Payer: Self-pay

## 2024-08-18 DIAGNOSIS — C50811 Malignant neoplasm of overlapping sites of right female breast: Secondary | ICD-10-CM

## 2024-08-18 LAB — GENETIC SCREENING ORDER

## 2024-08-26 LAB — SIGNATERA
SIGNATERA MTM READOUT: 0 MTM/ml
SIGNATERA TEST RESULT: NEGATIVE

## 2024-09-17 ENCOUNTER — Other Ambulatory Visit: Payer: Self-pay

## 2024-09-17 ENCOUNTER — Inpatient Hospital Stay: Payer: Self-pay | Attending: Adult Health | Admitting: Hematology and Oncology

## 2024-09-17 DIAGNOSIS — Z923 Personal history of irradiation: Secondary | ICD-10-CM | POA: Insufficient documentation

## 2024-09-17 DIAGNOSIS — Z9221 Personal history of antineoplastic chemotherapy: Secondary | ICD-10-CM | POA: Diagnosis not present

## 2024-09-17 DIAGNOSIS — Z170 Estrogen receptor positive status [ER+]: Secondary | ICD-10-CM | POA: Insufficient documentation

## 2024-09-17 DIAGNOSIS — Z79811 Long term (current) use of aromatase inhibitors: Secondary | ICD-10-CM | POA: Diagnosis not present

## 2024-09-17 DIAGNOSIS — C50811 Malignant neoplasm of overlapping sites of right female breast: Secondary | ICD-10-CM | POA: Insufficient documentation

## 2024-09-17 NOTE — Progress Notes (Signed)
 Patient Care Team: Cleotilde Planas, MD as PCP - General (Family Medicine) Curvin Deward MOULD, MD as Consulting Physician (General Surgery) Shannon Agent, MD as Consulting Physician (Radiation Oncology) Marilynn Nest, DO as Consulting Physician (Obstetrics and Gynecology) Marquita Comer Free, MD as Referring Physician (Hematology and Oncology) Crawford Morna Pickle, NP as Nurse Practitioner (Hematology and Oncology) Odean Potts, MD as Consulting Physician (Hematology and Oncology)  DIAGNOSIS:  Encounter Diagnosis  Name Primary?   Malignant neoplasm of overlapping sites of right breast in female, estrogen receptor positive (HCC)     SUMMARY OF ONCOLOGIC HISTORY: Oncology History  Malignant neoplasm of overlapping sites of right breast in female, estrogen receptor positive (HCC)  07/01/2020 Initial Diagnosis   Malignant neoplasm of overlapping sites of right breast in female, estrogen receptor positive (HCC)   07/28/2020 Cancer Staging   Staging form: Breast, AJCC 8th Edition - Pathologic stage from 07/28/2020: Stage IA (pT1c, pN0, cM0, G2, ER+, PR+, HER2-, Oncotype DX score: 28)   07/28/2020 Surgery   Right lumpectomy [facility]) [phone]): multifocal IDC, 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 Margin were negative. 4 lymph nodes were negative.   08/10/2020 Oncotype testing   The Oncotype DX score was 28 predicting a risk of outside the breast recurrence over the next 9 years of 17% if the patient's only systemic therapy is tamoxifen  for 5 years.    09/02/2020 - 11/04/2020 Chemotherapy   adjuvant chemotherapy consisting of docetaxel and cyclophosphamide given every 21 days x 4 started 09/02/2020, completed 11/04/2020 [facility])   11/30/2020 - 12/29/2020 Radiation Therapy   The patient initially received a dose of 40.05 Gy in 15 fractions to the breast using whole-breast tangent fields. This was delivered using a 3-D conformal technique. The pt received a boost delivering an  additional 10 Gy in 5 fractions using a electron boost with electrons. The total dose was 50.05 Gy.   12/2020 -  Anti-estrogen oral therapy   Tamoxifen      CHIEF COMPLIANT: Surveillance of breast cancer  HISTORY OF PRESENT ILLNESS: [name] [name] is a 53 year old with above-mentioned history of right breast cancer treated with lumpectomy adjuvant chemotherapy and radiation and is currently on anastrozole .  She is tolerating anastrozole  extremely well without any problems or concerns.  She denies any lumps or nodules in the breast.    ALLERGIES:  is allergic to keflex [cephalexin] and shellfish allergy.  MEDICATIONS:  Current Outpatient Medications  Medication Sig Dispense Refill   amoxicillin  (AMOXIL ) 500 MG capsule Take 500 mg by mouth 3 (three) times daily.     anastrozole  (ARIMIDEX ) 1 MG tablet TAKE 1 TABLET BY MOUTH EVERY DAY 90 tablet 1   cholecalciferol (VITAMIN D3) 25 MCG (1000 UNIT) tablet Take 1 tablet (1,000 Units total) by mouth daily. 90 tablet 3   clotrimazole -betamethasone  (LOTRISONE ) cream Apply 1 Application topically 2 (two) times daily. (Patient not taking: Reported on 07/07/2024) 30 g 0   fluconazole (DIFLUCAN) 150 MG tablet Take by mouth.     ibuprofen (ADVIL) 200 MG tablet Take 200-400 mg by mouth every 6 (six) hours as needed for headache or moderate pain.      MISC NATURAL PRODUCTS PO Take by mouth. [employer] (Hair, skin, nail supplement)     OVER THE COUNTER MEDICATION as needed. Tumeric liquid (Patient not taking: Reported on 07/07/2024)     Tetrahydrozoline HCl (VISINE OP) Place 1 drop into both eyes daily as needed (redness).      No current facility-administered medications for  this visit.    PHYSICAL EXAMINATION: ECOG PERFORMANCE STATUS: 1 - Symptomatic but completely ambulatory  Vitals:   09/17/24 1117  BP: 120/84  Pulse: 73  Resp: 18  Temp: 97.7 F (36.5 C)  SpO2: 100%   Filed Weights   09/17/24 1117  Weight: 152 lb (68.9 kg)       LABORATORY DATA:  I have reviewed the data as listed    Latest Ref Rng & Units 07/07/2024   12:42 PM 12/09/2023    5:04 PM 10/25/2023   10:28 AM  CMP  Glucose 70 - 99 mg/dL 89   82   BUN 6 - 20 mg/dL 11   14   Creatinine 9.55 - 1.00 mg/dL 9.18  8.99  9.06   Sodium 135 - 145 mmol/L 141   142   Potassium 3.5 - 5.1 mmol/L 4.0   3.5   Chloride 98 - 111 mmol/L 106   107   CO2 22 - 32 mmol/L 29   29   Calcium 8.9 - 10.3 mg/dL 9.8   9.5   Total Protein 6.5 - 8.1 g/dL 7.2   7.0   Total Bilirubin 0.0 - 1.2 mg/dL 0.6   0.5   Alkaline Phos 38 - 126 U/L 107   95   AST 15 - 41 U/L 22   23   ALT 0 - 44 U/L 16   15     Lab Results  Component Value Date   WBC 4.0 07/07/2024   HGB 11.8 (L) 07/07/2024   HCT 34.9 (L) 07/07/2024   MCV 88.8 07/07/2024   PLT 366 07/07/2024   NEUTROABS 1.4 (L) 07/07/2024    ASSESSMENT & PLAN:  Malignant neoplasm of overlapping sites of right breast in female, estrogen receptor positive (HCC) 07/01/2020: history of stage Ia estrogen positive breast cancer status post right lumpectomy, adjuvant chemotherapy (TC x 4), adjuvant radiation (completed 12/29/20), and was on tamoxifen  (since 12/2020), switched to anastrozole  and then switched to letrozole  [facility])   Antiestrogen therapy: She is currently on anastrozole  therapy.   CT CAP 01/10/22: Benign CT Head 12/07/21: Neg MRI brain 06/18/2023: [facility]: No infarct CT CAP 12/10/2023: No evidence of metastatic disease.  Cholelithiasis and uterine leiomyomas   Breast Cancer Surveillance: 1. Breast Exam: 09/17/24: Benign 2. Mammograms 06/08/2024: [facility], benign, breast density C 3. Bone Density: 04/22/2023: T score -1.6: Osteopenia 4.  Signatera testing: Negative 09/15/2024      No orders of the defined types were placed in this encounter.  The patient has a good understanding of the overall plan. she agrees with it. she will call with any problems that may develop before the next visit here.  I  personally spent a total of 30 minutes in the care of the patient today including preparing to see the patient, getting/reviewing separately obtained history, performing a medically appropriate exam/evaluation, counseling and educating, placing orders, referring and communicating with other health care professionals, documenting clinical information in the EHR, independently interpreting results, communicating results, and coordinating care.   [name] K Allante Beane, MD 09/17/24

## 2024-09-17 NOTE — Assessment & Plan Note (Signed)
 07/01/2020: history of stage Ia estrogen positive breast cancer status post right lumpectomy, adjuvant chemotherapy (TC x 4), adjuvant radiation (completed 12/29/20), and was on tamoxifen  (since 12/2020), switched to anastrozole  and then switched to letrozole  [facility])   Antiestrogen therapy: She is currently on anastrozole  therapy.   CT CAP 01/10/22: Benign CT Head 12/07/21: Neg MRI brain 06/18/2023: [facility]: No infarct CT CAP 12/10/2023: No evidence of metastatic disease.  Cholelithiasis and uterine leiomyomas   Breast Cancer Surveillance: 1. Breast Exam: 09/17/24: Benign 2. Mammograms 06/08/2024: [facility], benign, breast density C 3. Bone Density: 04/22/2023: T score -1.6: Osteopenia 4.  Signatera testing: Negative 09/15/2024  Persistent leg bruise since April with delayed healing

## 2024-11-20 ENCOUNTER — Telehealth: Payer: Self-pay

## 2024-11-20 NOTE — Telephone Encounter (Signed)
 Return pt call back. LVM confirming pt appt on 11/23/24.

## 2024-11-22 NOTE — Assessment & Plan Note (Signed)
 07/01/2020: history of stage Ia estrogen positive breast cancer status post right lumpectomy, adjuvant chemotherapy (TC x 4), adjuvant radiation (completed 12/29/20), and was on tamoxifen  (since 12/2020), switched to anastrozole  and then switched to letrozole  [facility])   Antiestrogen therapy: She is currently on anastrozole  therapy.   CT CAP 01/10/22: Benign CT Head 12/07/21: Neg MRI brain 06/18/2023: [facility]: No infarct CT CAP 12/10/2023: No evidence of metastatic disease.  Cholelithiasis and uterine leiomyomas   Breast Cancer Surveillance: 1. Breast Exam: 09/17/24: Benign 2. Mammograms 06/08/2024: [facility], benign, breast density C 3. Bone Density: 04/22/2023: T score -1.6: Osteopenia 4.  Signatera testing: Negative 09/15/2024

## 2024-11-23 ENCOUNTER — Inpatient Hospital Stay: Payer: Self-pay | Attending: Adult Health | Admitting: Hematology and Oncology

## 2024-11-23 VITALS — BP 105/67 | HR 77 | Temp 98.0°F | Resp 17 | Ht 61.0 in | Wt 155.0 lb

## 2024-11-23 DIAGNOSIS — Z170 Estrogen receptor positive status [ER+]: Secondary | ICD-10-CM

## 2024-11-23 DIAGNOSIS — C50811 Malignant neoplasm of overlapping sites of right female breast: Secondary | ICD-10-CM

## 2024-11-23 DIAGNOSIS — Z780 Asymptomatic menopausal state: Secondary | ICD-10-CM

## 2024-11-23 NOTE — Progress Notes (Signed)
 "  Patient Care Team: Cleotilde Planas, MD as PCP - General (Family Medicine) Curvin Deward MOULD, MD as Consulting Physician (General Surgery) Shannon Agent, MD as Consulting Physician (Radiation Oncology) Marilynn Nest, DO as Consulting Physician (Obstetrics and Gynecology) Marquita Comer Free, MD as Referring Physician (Hematology and Oncology) Crawford Morna Pickle, NP as Nurse Practitioner (Hematology and Oncology) Odean Potts, MD as Consulting Physician (Hematology and Oncology)  DIAGNOSIS:  Encounter Diagnosis  Name Primary?   Malignant neoplasm of overlapping sites of right breast in female, estrogen receptor positive (HCC) Yes    SUMMARY OF ONCOLOGIC HISTORY: Oncology History  Malignant neoplasm of overlapping sites of right breast in female, estrogen receptor positive (HCC)  07/01/2020 Initial Diagnosis   Malignant neoplasm of overlapping sites of right breast in female, estrogen receptor positive (HCC)   07/28/2020 Cancer Staging   Staging form: Breast, AJCC 8th Edition - Pathologic stage from 07/28/2020: Stage IA (pT1c, pN0, cM0, G2, ER+, PR+, HER2-, Oncotype DX score: 28)   07/28/2020 Surgery   Right lumpectomy [facility]) [phone]): multifocal IDC, 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 Margin were negative. 4 lymph nodes were negative.   08/10/2020 Oncotype testing   The Oncotype DX score was 28 predicting a risk of outside the breast recurrence over the next 9 years of 17% if the patient's only systemic therapy is tamoxifen  for 5 years.    09/02/2020 - 11/04/2020 Chemotherapy   adjuvant chemotherapy consisting of docetaxel and cyclophosphamide given every 21 days x 4 started 09/02/2020, completed 11/04/2020 [facility])   11/30/2020 - 12/29/2020 Radiation Therapy   The patient initially received a dose of 40.05 Gy in 15 fractions to the breast using whole-breast tangent fields. This was delivered using a 3-D conformal technique. The pt received a boost delivering an  additional 10 Gy in 5 fractions using a electron boost with electrons. The total dose was 50.05 Gy.   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  She is asymptomatic with no breast pain, palpable masses, or other new symptoms since her last visit.  Surveillance [facility] ctDNA testing remains on a six-month schedule, with the most recent result negative.  She has osteopenia related to aromatase inhibitor therapy but has no new bone pain or other bone-related symptoms. Her last bone density scan was in June 2024 and the next is planned for June 2026.  She prefers to have blood work done in the clinic and is satisfied with her current arrangement for routine laboratory monitoring.       ALLERGIES:  is allergic to keflex [cephalexin] and shellfish allergy.  MEDICATIONS:  Current Outpatient Medications  Medication Sig Dispense Refill   amoxicillin  (AMOXIL ) 500 MG capsule Take 500 mg by mouth 3 (three) times daily.     anastrozole  (ARIMIDEX ) 1 MG tablet TAKE 1 TABLET BY MOUTH EVERY DAY 90 tablet 1   cholecalciferol (VITAMIN D3) 25 MCG (1000 UNIT) tablet Take 1 tablet (1,000 Units total) by mouth daily. 90 tablet 3   clotrimazole -betamethasone  (LOTRISONE ) cream Apply 1 Application topically 2 (two) times daily. (Patient not taking: Reported on 07/07/2024) 30 g 0   fluconazole (DIFLUCAN) 150 MG tablet  Take by mouth.     ibuprofen (ADVIL) 200 MG tablet Take 200-400 mg by mouth every 6 (six) hours as needed for headache or moderate pain.      MISC NATURAL PRODUCTS PO Take by mouth. The  [employer] (Hair, skin, nail supplement)     OVER THE COUNTER MEDICATION as needed. Tumeric liquid (Patient not taking: Reported on 07/07/2024)     Tetrahydrozoline HCl (VISINE OP) Place 1 drop into both eyes daily as needed (redness).      No current facility-administered medications for this visit.    PHYSICAL EXAMINATION: ECOG PERFORMANCE STATUS: 1 - Symptomatic but completely ambulatory  Vitals:   11/23/24 0849  BP: 105/67  Pulse: 77  Resp: 17  Temp: 98 F (36.7 C)  SpO2: 100%   Filed Weights   11/23/24 0849  Weight: 155 lb (70.3 kg)      LABORATORY DATA:  I have reviewed the data as listed    Latest Ref Rng & Units 07/07/2024   12:42 PM 12/09/2023    5:04 PM 10/25/2023   10:28 AM  CMP  Glucose 70 - 99 mg/dL 89   82   BUN 6 - 20 mg/dL 11   14   Creatinine 9.55 - 1.00 mg/dL 9.18  8.99  9.06   Sodium 135 - 145 mmol/L 141   142   Potassium 3.5 - 5.1 mmol/L 4.0   3.5   Chloride 98 - 111 mmol/L 106   107   CO2 22 - 32 mmol/L 29   29   Calcium 8.9 - 10.3 mg/dL 9.8   9.5   Total Protein 6.5 - 8.1 g/dL 7.2   7.0   Total Bilirubin 0.0 - 1.2 mg/dL 0.6   0.5   Alkaline Phos 38 - 126 U/L 107   95   AST 15 - 41 U/L 22   23   ALT 0 - 44 U/L 16   15     Lab Results  Component Value Date   WBC 4.0 07/07/2024   HGB 11.8 (L) 07/07/2024   HCT 34.9 (L) 07/07/2024   MCV 88.8 07/07/2024   PLT 366 07/07/2024   NEUTROABS 1.4 (L) 07/07/2024    ASSESSMENT & PLAN:  Malignant neoplasm of overlapping sites of right breast in female, estrogen receptor positive (HCC) 07/01/2020: history of stage Ia estrogen positive breast cancer status post right lumpectomy, adjuvant chemotherapy (TC x 4), adjuvant radiation (completed 12/29/20), and was on tamoxifen  (since 12/2020), switched to anastrozole  and then switched to letrozole  [facility])   Antiestrogen therapy: She is currently on anastrozole  therapy.   CT CAP 01/10/22: Benign CT Head 12/07/21: Neg MRI brain 06/18/2023: [facility]: No infarct CT CAP 12/10/2023: No evidence of metastatic disease.  Cholelithiasis and uterine leiomyomas   Breast Cancer Surveillance: 1. Breast Exam: 09/17/24: Benign 2. Mammograms 06/08/2024: [facility], benign, breast density C 3. Bone Density: 04/22/2023: T score -1.6: Osteopenia 4.  [facility]atera testing: Negative 09/15/2024      Today's appointment was made by mistake.  Therefore we will not charge her for today's visit. We will see her back in November for her annual follow-up.  No orders of the defined types were placed in this encounter.  The patient has a good understanding of the overall plan. she agrees with it. she will call with any problems that may develop before the next visit here.  I personally spent a total of 30 minutes in the care of the patient today  including preparing to see the patient, getting/reviewing separately obtained history, performing a medically appropriate exam/evaluation, counseling and educating, placing orders, referring and communicating with other health care professionals, documenting clinical information in the EHR, independently interpreting results, communicating results, and coordinating care.   Viinay K Blessed Girdner, MD 11/23/2024    "

## 2025-02-15 ENCOUNTER — Other Ambulatory Visit: Payer: Self-pay

## 2025-08-18 ENCOUNTER — Inpatient Hospital Stay

## 2025-09-21 ENCOUNTER — Inpatient Hospital Stay: Admitting: Hematology and Oncology
